# Patient Record
Sex: Male | Born: 1950 | Hispanic: Yes | Marital: Married | Smoking: Never smoker
Health system: Southern US, Community
[De-identification: ages and names within clinical notes are randomized; demographics above are authoritative.]

## PROBLEM LIST (undated history)

## (undated) DIAGNOSIS — E785 Hyperlipidemia, unspecified: Secondary | ICD-10-CM

## (undated) DIAGNOSIS — H3320 Serous retinal detachment, unspecified eye: Secondary | ICD-10-CM

## (undated) DIAGNOSIS — I10 Essential (primary) hypertension: Secondary | ICD-10-CM

## (undated) DIAGNOSIS — K759 Inflammatory liver disease, unspecified: Secondary | ICD-10-CM

## (undated) DIAGNOSIS — I2510 Atherosclerotic heart disease of native coronary artery without angina pectoris: Secondary | ICD-10-CM

## (undated) DIAGNOSIS — Z87442 Personal history of urinary calculi: Secondary | ICD-10-CM

## (undated) DIAGNOSIS — D649 Anemia, unspecified: Secondary | ICD-10-CM

## (undated) DIAGNOSIS — N200 Calculus of kidney: Secondary | ICD-10-CM

## (undated) DIAGNOSIS — I219 Acute myocardial infarction, unspecified: Secondary | ICD-10-CM

## (undated) HISTORY — PX: EYE SURGERY: SHX253

## (undated) HISTORY — DX: Anemia, unspecified: D64.9

---

## 2004-06-30 ENCOUNTER — Ambulatory Visit: Payer: Self-pay | Admitting: Internal Medicine

## 2004-07-21 ENCOUNTER — Ambulatory Visit: Payer: Self-pay | Admitting: Internal Medicine

## 2004-09-18 ENCOUNTER — Ambulatory Visit: Payer: Self-pay | Admitting: Internal Medicine

## 2010-06-19 ENCOUNTER — Ambulatory Visit: Payer: Self-pay | Admitting: Internal Medicine

## 2010-06-19 ENCOUNTER — Ambulatory Visit (INDEPENDENT_AMBULATORY_CARE_PROVIDER_SITE_OTHER): Payer: Self-pay | Admitting: Internal Medicine

## 2010-06-19 ENCOUNTER — Encounter: Payer: Self-pay | Admitting: Internal Medicine

## 2010-06-19 DIAGNOSIS — Z131 Encounter for screening for diabetes mellitus: Secondary | ICD-10-CM

## 2010-06-19 DIAGNOSIS — E785 Hyperlipidemia, unspecified: Secondary | ICD-10-CM | POA: Insufficient documentation

## 2010-06-19 DIAGNOSIS — L409 Psoriasis, unspecified: Secondary | ICD-10-CM | POA: Insufficient documentation

## 2010-06-19 DIAGNOSIS — I10 Essential (primary) hypertension: Secondary | ICD-10-CM | POA: Insufficient documentation

## 2010-06-19 DIAGNOSIS — R21 Rash and other nonspecific skin eruption: Secondary | ICD-10-CM | POA: Insufficient documentation

## 2010-06-24 NOTE — Assessment & Plan Note (Addendum)
Summary: rash/kn   Vital Signs:  Patient profile:   60 year old male Height:      69 inches Weight:      182.50 pounds BMI:     27.05 Pulse rate:   90 / minute Pulse rhythm:   regular BP sitting:   142 / 88  (left arm) Cuff size:   regular  Vitals Entered By: Army Fossa CMA (June 19, 2010 1:47 PM) CC: c/o rash on face- spreading to eye area x 1 week CBG Result 87 Comments [facility] [address]    History of Present Illness: New Patient symptoms started 2 weeks ago, no clear triggers rash is only slightly  itchy, started as blisters (?) OTC hydrocortisone did no change rash much he is not on any new meds, did change his soap before the rash but used all over not only in the face ; he recall been in contact w/ chemicals at work but doesn't recall the names  ROS no fever no ST no hands, wrist lesion, actually no other lesions  anywhere no other family members affected   Current Medications (verified): 1)  Diovan 80 Mg Tabs (Valsartan) .Marland Kitchen.. 1 By Mouth Once Daily  Allergies (verified): 1)  ! Pcn 2)  ! 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   Ears:  R ear normal and L ear normal.   Mouth:  no redness  Skin:  worse rash is around the mouth L>R, lower > upper area: redness, thick skin , slightly  scally, no blisters. Similar rash,  ~ 1cm above R eyebrown Similar rash but much smaller at the lower R>L eye lid and L cheeck ( ~2x2cm) a macular erythema -dry area on the sides of the neck nose, hands,  wrist----clear   Impression & Recommendations:  Problem # 1:  RASH-NONVESICULAR (ICD-782.1) given location i suspect contact dermatitis dout herpes , folliculitis. ?rosacea CBG 87 plan: treat as allergies, see Rx;  advised the patient to call me soon, if he's not improving or getting worse, he will need a dermatology referral  His updated medication list for this problem includes:    Hydrocortisone 2.5 % Crea (Hydrocortisone) .Marland Kitchen... Apply two times a day x 10 days  Complete Medication List: 1)  Diovan 80 Mg Tabs (Valsartan) .Marland Kitchen.. 1 by mouth once daily 2)  Aspirin 81 Mg Tbec (Aspirin) .... Prn 3)  Prednisone 10 Mg Tabs (Prednisone) .... 4 by mouth once daily x 3, 3x3,2x3,1x3 4)  Hydrocortisone 2.5 % Crea (Hydrocortisone) .... Apply two times a day x 10 days  Other Orders: Glucose, (CBG) (16109)  Patient Instructions: 1)  avoid cream in your eyes 2)  call if symptoms increase or no better in 1 week Prescriptions: HYDROCORTISONE 2.5 % CREA (HYDROCORTISONE) apply two times a day x 10 days  #1 x 0   Entered and Authorized by:   Nolon Rod. Devaunte Gasparini MD   Signed by:   Nolon Rod. Yahya Boldman MD on 06/19/2010   Method used:   Print then Give to Patient   RxID:   [id number] PREDNISONE 10  MG TABS (PREDNISONE) 4 by mouth once daily x 3, 3x3,2x3,1x3  #30 x 0   Entered and Authorized by:   Elita Quick E. Winfred Iiams MD   Signed by:   Nolon Rod. Daryl Quiros MD on 06/19/2010   Method used:   Print then Give to Patient   RxID:   [id number]    Orders Added: 1)  Glucose, (CBG) [82962] 2)  New Patient Level II [99202]    Prevention & Chronic Care Immunizations   Influenza vaccine: Not documented    Tetanus booster: Not documented    Pneumococcal vaccine: Not documented    H. zoster vaccine: Not documented  Colorectal Screening   Hemoccult: Not documented    Colonoscopy: Not documented  Other Screening   PSA: Not documented   Smoking status: Not documented  Lipids   Total Cholesterol: Not documented    LDL: Not documented   LDL Direct: Not documented   HDL: Not documented   Triglycerides: Not documented    SGOT (AST): Not documented   SGPT (ALT): Not documented   Alkaline phosphatase: Not documented   Total bilirubin: Not documented  Hypertension   Last Blood Pressure: 142 / 88  (06/19/2010)   Serum creatinine: Not documented   Serum potassium Not documented  Self-Management Support :    Hypertension self-management support: Not documented    Lipid self-management support: Not documented    Appended Document: rash/kn  spoke with the patient's wife, rash is  better.

## 2010-07-03 NOTE — Progress Notes (Signed)
Summary: [facility] Office Note  [facility] Office Note   Imported By: Maryln Gottron 06/24/2010 13:26:27  _____________________________________________________________________  External Attachment:    Type:   Image     Comment:   External Document

## 2014-04-06 ENCOUNTER — Emergency Department (HOSPITAL_BASED_OUTPATIENT_CLINIC_OR_DEPARTMENT_OTHER): Payer: No Typology Code available for payment source

## 2014-04-06 ENCOUNTER — Emergency Department (HOSPITAL_BASED_OUTPATIENT_CLINIC_OR_DEPARTMENT_OTHER)
Admission: EM | Admit: 2014-04-06 | Discharge: 2014-04-06 | Disposition: A | Discharge status: Home / Self Care | Payer: No Typology Code available for payment source | Attending: Emergency Medicine | Admitting: Emergency Medicine

## 2014-04-06 ENCOUNTER — Encounter (HOSPITAL_BASED_OUTPATIENT_CLINIC_OR_DEPARTMENT_OTHER): Payer: Self-pay | Admitting: Emergency Medicine

## 2014-04-06 DIAGNOSIS — Z880 Allergy status to penicillin: Secondary | ICD-10-CM | POA: Insufficient documentation

## 2014-04-06 DIAGNOSIS — Z79899 Other long term (current) drug therapy: Secondary | ICD-10-CM | POA: Diagnosis not present

## 2014-04-06 DIAGNOSIS — I10 Essential (primary) hypertension: Secondary | ICD-10-CM | POA: Insufficient documentation

## 2014-04-06 DIAGNOSIS — R1032 Left lower quadrant pain: Secondary | ICD-10-CM

## 2014-04-06 DIAGNOSIS — E785 Hyperlipidemia, unspecified: Secondary | ICD-10-CM | POA: Diagnosis not present

## 2014-04-06 DIAGNOSIS — N201 Calculus of ureter: Secondary | ICD-10-CM | POA: Insufficient documentation

## 2014-04-06 DIAGNOSIS — Z8669 Personal history of other diseases of the nervous system and sense organs: Secondary | ICD-10-CM | POA: Insufficient documentation

## 2014-04-06 DIAGNOSIS — Z87442 Personal history of urinary calculi: Secondary | ICD-10-CM | POA: Insufficient documentation

## 2014-04-06 HISTORY — DX: Hyperlipidemia, unspecified: E78.5

## 2014-04-06 HISTORY — DX: Serous retinal detachment, unspecified eye: H33.20

## 2014-04-06 HISTORY — DX: Essential (primary) hypertension: I10

## 2014-04-06 HISTORY — DX: Calculus of kidney: N20.0

## 2014-04-06 LAB — CBC WITH DIFFERENTIAL/PLATELET
BASOS PCT: 0 % (ref 0–1)
Basophils Absolute: 0 10*3/uL (ref 0.0–0.1)
EOS PCT: 1 % (ref 0–5)
Eosinophils Absolute: 0.1 10*3/uL (ref 0.0–0.7)
HCT: 43.3 % (ref 39.0–52.0)
Hemoglobin: 15.2 g/dL (ref 13.0–17.0)
Lymphocytes Relative: 35 % (ref 12–46)
Lymphs Abs: 3.4 10*3/uL (ref 0.7–4.0)
MCH: 31.1 pg (ref 26.0–34.0)
MCHC: 35.1 g/dL (ref 30.0–36.0)
MCV: 88.5 fL (ref 78.0–100.0)
Monocytes Absolute: 0.7 10*3/uL (ref 0.1–1.0)
Monocytes Relative: 7 % (ref 3–12)
NEUTROS PCT: 57 % (ref 43–77)
Neutro Abs: 5.5 10*3/uL (ref 1.7–7.7)
PLATELETS: 234 10*3/uL (ref 150–400)
RBC: 4.89 MIL/uL (ref 4.22–5.81)
RDW: 13.1 % (ref 11.5–15.5)
WBC: 9.7 10*3/uL (ref 4.0–10.5)

## 2014-04-06 LAB — COMPREHENSIVE METABOLIC PANEL
ALBUMIN: 4.2 g/dL (ref 3.5–5.2)
ALK PHOS: 73 U/L (ref 39–117)
ALT: 39 U/L (ref 0–53)
ANION GAP: 15 (ref 5–15)
AST: 32 U/L (ref 0–37)
BUN: 17 mg/dL (ref 6–23)
CO2: 23 mEq/L (ref 19–32)
Calcium: 9.5 mg/dL (ref 8.4–10.5)
Chloride: 102 mEq/L (ref 96–112)
Creatinine, Ser: 1.4 mg/dL — ABNORMAL HIGH (ref 0.50–1.35)
GFR calc Af Amer: 60 mL/min — ABNORMAL LOW (ref >90)
GFR calc non Af Amer: 52 mL/min — ABNORMAL LOW (ref >90)
Glucose, Bld: 160 mg/dL — ABNORMAL HIGH (ref 70–99)
POTASSIUM: 3.9 meq/L (ref 3.7–5.3)
Sodium: 140 mEq/L (ref 137–147)
Total Bilirubin: 0.3 mg/dL (ref 0.3–1.2)
Total Protein: 7.4 g/dL (ref 6.0–8.3)

## 2014-04-06 LAB — URINALYSIS, ROUTINE W REFLEX MICROSCOPIC
BILIRUBIN URINE: NEGATIVE
Glucose, UA: NEGATIVE mg/dL
Ketones, ur: 15 mg/dL — AB
Leukocytes, UA: NEGATIVE
NITRITE: NEGATIVE
PH: 6 (ref 5.0–8.0)
Protein, ur: 30 mg/dL — AB
SPECIFIC GRAVITY, URINE: 1.025 (ref 1.005–1.030)
UROBILINOGEN UA: 0.2 mg/dL (ref 0.0–1.0)

## 2014-04-06 LAB — URINE MICROSCOPIC-ADD ON

## 2014-04-06 MED ORDER — FENTANYL CITRATE 0.05 MG/ML IJ SOLN
50.0000 ug | Freq: Once | INTRAMUSCULAR | Status: AC
  Administered 2014-04-06: 50 ug via INTRAVENOUS

## 2014-04-06 MED ORDER — ONDANSETRON 8 MG PO TBDP: 8.0000 mg | ORAL_TABLET | Freq: Three times a day (TID) | ORAL | Status: DC | PRN

## 2014-04-06 MED ORDER — ONDANSETRON HCL 4 MG/2ML IJ SOLN
4.0000 mg | Freq: Once | INTRAMUSCULAR | Status: AC
  Administered 2014-04-06: 4 mg via INTRAVENOUS
  Filled 2014-04-06: qty 2

## 2014-04-06 MED ORDER — SODIUM CHLORIDE 0.9 % IV SOLN
1000.0000 mL | Freq: Once | INTRAVENOUS | Status: AC
  Administered 2014-04-06: 1000 mL via INTRAVENOUS

## 2014-04-06 MED ORDER — TAMSULOSIN HCL 0.4 MG PO CAPS
0.4000 mg | ORAL_CAPSULE | Freq: Once | ORAL | Status: AC
  Administered 2014-04-06: 0.4 mg via ORAL
  Filled 2014-04-06: qty 1

## 2014-04-06 MED ORDER — HYDROMORPHONE HCL 4 MG PO TABS: 2.0000 mg | ORAL_TABLET | ORAL | Status: DC | PRN

## 2014-04-06 MED ORDER — FENTANYL CITRATE 0.05 MG/ML IJ SOLN
INTRAMUSCULAR | Status: AC
  Filled 2014-04-06: qty 2

## 2014-04-06 MED ORDER — KETOROLAC TROMETHAMINE 15 MG/ML IJ SOLN
15.0000 mg | Freq: Once | INTRAMUSCULAR | Status: AC
  Administered 2014-04-06: 15 mg via INTRAVENOUS
  Filled 2014-04-06: qty 1

## 2014-04-06 MED ORDER — TAMSULOSIN HCL 0.4 MG PO CAPS: ORAL_CAPSULE | ORAL | Status: DC

## 2014-04-06 MED ORDER — SODIUM CHLORIDE 0.9 % IV SOLN
1000.0000 mL | INTRAVENOUS | Status: DC
  Administered 2014-04-06: 1000 mL via INTRAVENOUS

## 2014-04-06 NOTE — ED Provider Notes (Signed)
CSN: [id number]     Arrival date & time 04/06/14  0358 History   First MD Initiated Contact with Patient 04/06/14 0441     Chief Complaint  Patient presents with  . Abdominal Pain     (Consider location/radiation/quality/duration/timing/severity/associated sxs/prior Treatment) HPI  This is a 63 year old male who had the sudden onset of left lower quadrant pain about 1:30 this morning. The pain has gradually worsened and is now severe. The pain does not change significantly with movement or palpation. There is some radiation of the pain to the left flank. He has a history of kidney stones in the past but is not sure if this feels the same. He has had nausea but no vomiting. He has had some diarrhea. He denies fever. He has had chills. He is having difficulty urinating. He is not aware of having hematuria.  Past Medical History  Diagnosis Date  . Hypertension   . Hyperlipidemia   . Kidney stone   . Retinal detachment    Past Surgical History  Procedure Laterality Date  . Eye surgery     History reviewed. No pertinent family history. History  Substance Use Topics  . Smoking status: Never Smoker   . Smokeless tobacco: Not on file  . Alcohol Use: No    Review of Systems  All other systems reviewed and are negative.   Allergies  Lisinopril and Penicillins  Home Medications   Prior to Admission medications   Medication Sig Start Date End Date Taking? Authorizing Provider  atorvastatin (LIPITOR) 20 MG tablet Take 20 mg by mouth daily.   Yes Historical Provider, MD  losartan (COZAAR) 100 MG tablet Take 100 mg by mouth daily.   Yes Historical Provider, MD  metoprolol succinate (TOPROL-XL) 50 MG 24 hr tablet Take 50 mg by mouth daily. Take with or immediately following a meal.   Yes Historical Provider, MD   BP 162/82 mmHg  Pulse 70  Temp(Src) 98 F (36.7 C) (Oral)  Resp 20  Ht 5\' 7"  (1.702 m)  Wt 185 lb (83.915 kg)  BMI 28.97 kg/m2  SpO2 96%   Physical Exam  General:  Well-developed, well-nourished male in no acute distress; appearance consistent with age of record HENT: normocephalic; atraumatic Eyes: Right pupil round and reactive to light; left pupil irregular; extraocular muscles intact Neck: supple Heart: regular rate and rhythm Lungs: clear to auscultation bilaterally Abdomen: soft; nondistended; nontender; no masses or hepatosplenomegaly; bowel sounds present GU: No CVA tenderness Extremities: No deformity; full range of motion; pulses normal Neurologic: Awake, alert and oriented; motor function intact in all extremities and symmetric; no facial droop Skin: Warm and dry Psychiatric: Flat affect  ED Course  Procedures (including critical care time)   MDM  Nursing notes and vitals signs, including pulse oximetry, reviewed.  Summary of this visit's results, reviewed by myself:  Labs:  Results for orders placed or performed during the hospital encounter of 04/06/14 (from the past 24 hour(s))  Urinalysis with microscopic     Status: Abnormal   Collection Time: 04/06/14  4:10 AM  Result Value Ref Range   Color, Urine YELLOW YELLOW   APPearance CLOUDY (A) CLEAR   Specific Gravity, Urine 1.025 1.005 - 1.030   pH 6.0 5.0 - 8.0   Glucose, UA NEGATIVE NEGATIVE mg/dL   Hgb urine dipstick LARGE (A) NEGATIVE   Bilirubin Urine NEGATIVE NEGATIVE   Ketones, ur 15 (A) NEGATIVE mg/dL   Protein, ur 30 (A) NEGATIVE mg/dL   Urobilinogen, UA  0.2 0.0 - 1.0 mg/dL   Nitrite NEGATIVE NEGATIVE   Leukocytes, UA NEGATIVE NEGATIVE  Urine microscopic-add on     Status: Abnormal   Collection Time: 04/06/14  4:10 AM  Result Value Ref Range   Squamous Epithelial / LPF RARE RARE   WBC, UA 0-2 <3 WBC/hpf   RBC / HPF TOO NUMEROUS TO COUNT <3 RBC/hpf   Bacteria, UA FEW (A) RARE   Urine-Other MUCOUS PRESENT   CBC WITH DIFFERENTIAL     Status: None   Collection Time: 04/06/14  4:20 AM  Result Value Ref Range   WBC 9.7 4.0 - 10.5 K/uL   RBC 4.89 4.22 - 5.81 MIL/uL    Hemoglobin 15.2 13.0 - 17.0 g/dL   HCT 16.1 09.6 - 04.5 %   MCV 88.5 78.0 - 100.0 fL   MCH 31.1 26.0 - 34.0 pg   MCHC 35.1 30.0 - 36.0 g/dL   RDW 40.9 81.1 - 91.4 %   Platelets 234 150 - 400 K/uL   Neutrophils Relative % 57 43 - 77 %   Neutro Abs 5.5 1.7 - 7.7 K/uL   Lymphocytes Relative 35 12 - 46 %   Lymphs Abs 3.4 0.7 - 4.0 K/uL   Monocytes Relative 7 3 - 12 %   Monocytes Absolute 0.7 0.1 - 1.0 K/uL   Eosinophils Relative 1 0 - 5 %   Eosinophils Absolute 0.1 0.0 - 0.7 K/uL   Basophils Relative 0 0 - 1 %   Basophils Absolute 0.0 0.0 - 0.1 K/uL  Comprehensive metabolic panel     Status: Abnormal   Collection Time: 04/06/14  4:20 AM  Result Value Ref Range   Sodium 140 137 - 147 mEq/L   Potassium 3.9 3.7 - 5.3 mEq/L   Chloride 102 96 - 112 mEq/L   CO2 23 19 - 32 mEq/L   Glucose, Bld 160 (H) 70 - 99 mg/dL   BUN 17 6 - 23 mg/dL   Creatinine, Ser 7.82 (H) 0.50 - 1.35 mg/dL   Calcium 9.5 8.4 - 95.6 mg/dL   Total Protein 7.4 6.0 - 8.3 g/dL   Albumin 4.2 3.5 - 5.2 g/dL   AST 32 0 - 37 U/L   ALT 39 0 - 53 U/L   Alkaline Phosphatase 73 39 - 117 U/L   Total Bilirubin 0.3 0.3 - 1.2 mg/dL   GFR calc non Af Amer 52 (L) >90 mL/min   GFR calc Af Amer 60 (L) >90 mL/min   Anion gap 15 5 - 15    Imaging Studies: Ct Renal Stone Study  04/06/2014   CLINICAL DATA:  Left lower quadrant pain and nausea. History of renal stones.  EXAM: CT ABDOMEN AND PELVIS WITHOUT CONTRAST  TECHNIQUE: Multidetector CT imaging of the abdomen and pelvis was performed following the standard protocol without IV contrast.  COMPARISON:  None.  FINDINGS: Dependent atelectasis in the lung bases.  4 mm stone in the distal left ureter at the ureterovesical junction with left hydronephrosis and hydroureter and. Ureteral stranding. Additional nonobstructing stones in both kidneys. No hydronephrosis or hydroureter on the right.  The bladder is incompletely distended. Mild bladder wall thickening may be due at least in part  to under distention.  Unenhanced appearance of the liver, spleen, gallbladder, pancreas, adrenal glands, abdominal aorta common inferior vena cava, and retroperitoneal lymph nodes is unremarkable. Stomach, small bowel, and colon are decompressed. No free air or free fluid in the abdomen.  Pelvis: Appendix is normal.  Prostate gland is enlarged, measuring 5.8 cm diameter. No free or loculated pelvic fluid collections. No pelvic mass or lymphadenopathy. No destructive bone lesions.  IMPRESSION: 4 mm stone in the distal left ureter with moderate proximal obstruction. Bilateral nonobstructing intrarenal stones. Prostate gland enlargement.   Electronically Signed   By: Burman NievesWilliam  Stevens M.D.   On: 04/06/2014 05:21   5:51 AM Patient comfortable after IV fentanyl, Zofran and Toradol.   Hanley SeamenJohn L Vernita Tague, MD 04/06/14 [id number]

## 2014-04-06 NOTE — ED Notes (Signed)
Pt reports sudden acute onset of llq pain, some nausea, no diarrhea, reports h/o kidney stones , but states that this pain feels different

## 2015-04-13 IMAGING — CT CT RENAL STONE PROTOCOL
2 of 4 series · 16 of 46 positions shown, 18 images · non-contrast
Comparison: None.

CLINICAL DATA: Left lower quadrant pain and nausea. History of
renal stones.

EXAM:
CT ABDOMEN AND PELVIS WITHOUT CONTRAST
TECHNIQUE: Multidetector CT imaging of the abdomen and pelvis was performed
following the standard protocol without IV contrast.

[Series 2: renal stone > 200 lbs 5.0 b31f · axial · 0.73mm/px · z∈[-467,+3]mm · 13 of 104 slices shown, 15 images]
[im 5/104  soft-tissue]
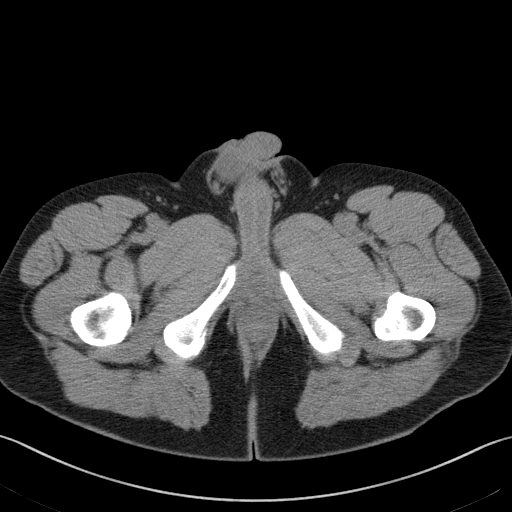
[im 5/104  bone]
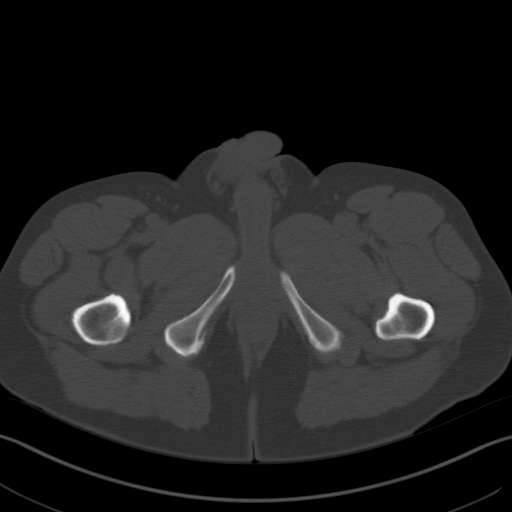
[im 15/104  soft-tissue]
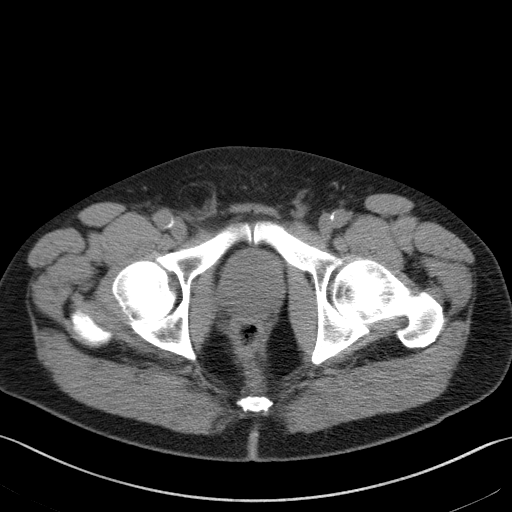
[im 24/104  soft-tissue]
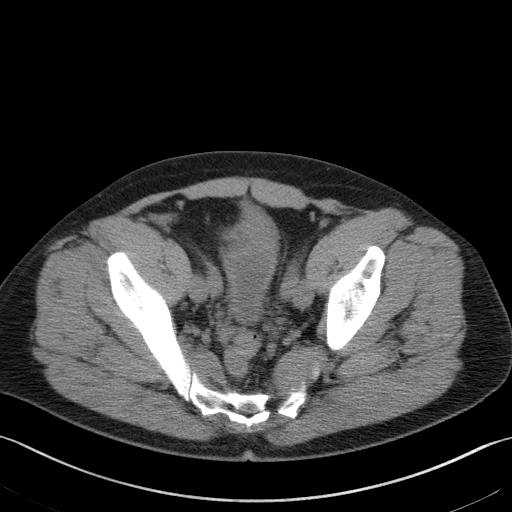
[im 29/104  soft-tissue]
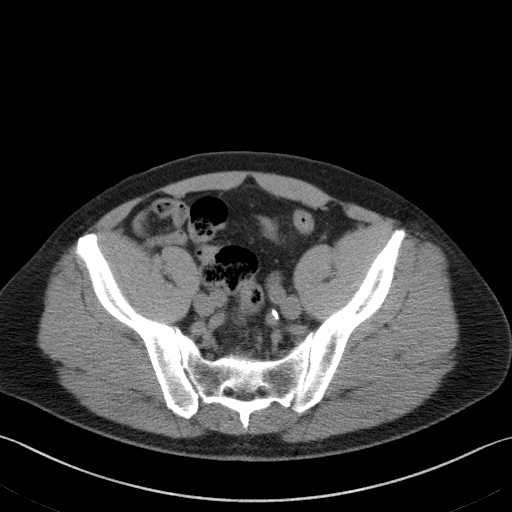
[im 38/104  soft-tissue]
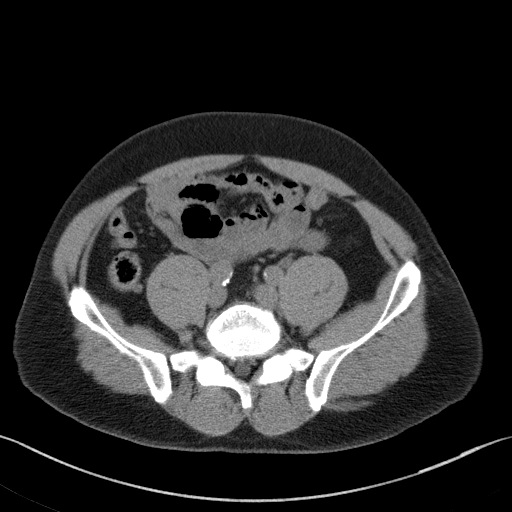
[im 43/104  soft-tissue]
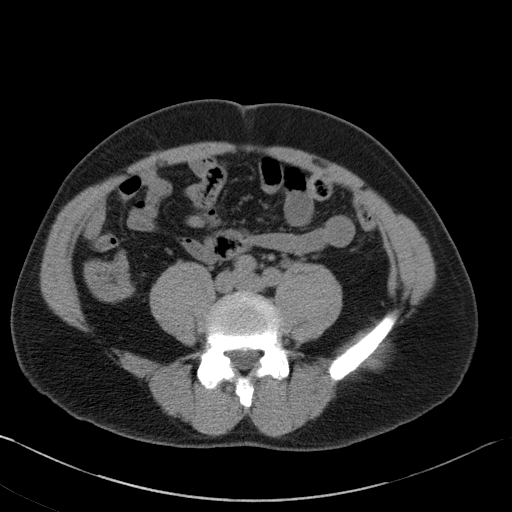
[im 52/104  soft-tissue]
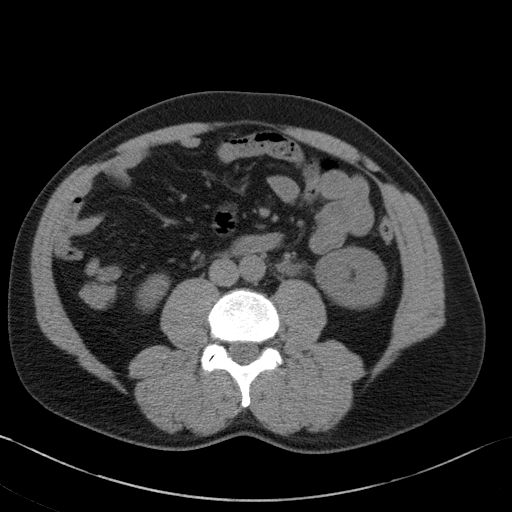
[im 61/104  soft-tissue]
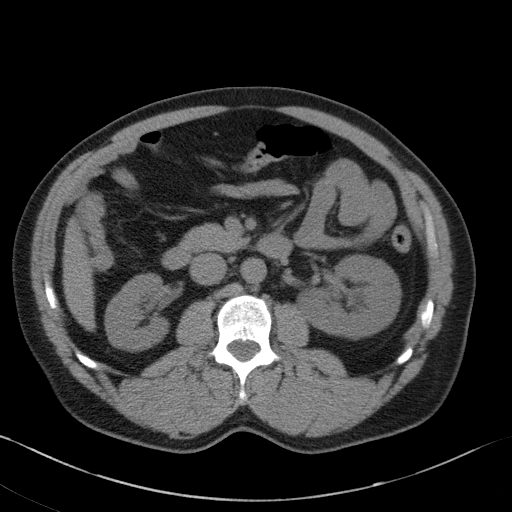
[im 66/104  soft-tissue]
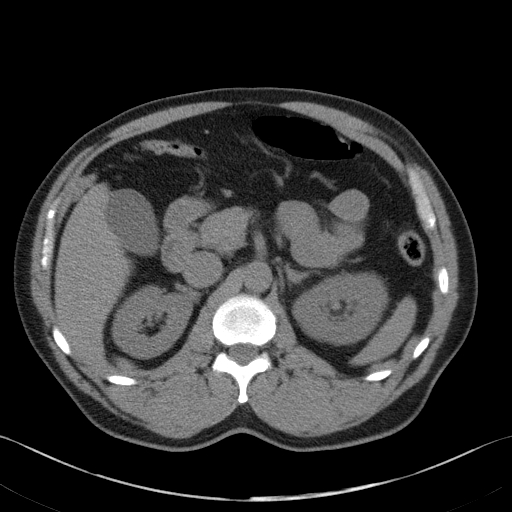
[im 66/104  bone]
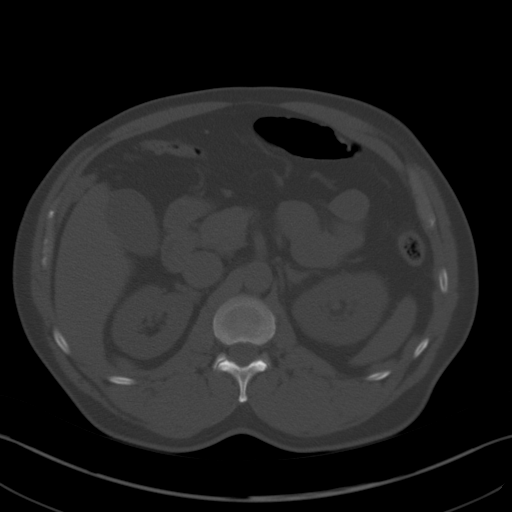
[im 75/104  soft-tissue]
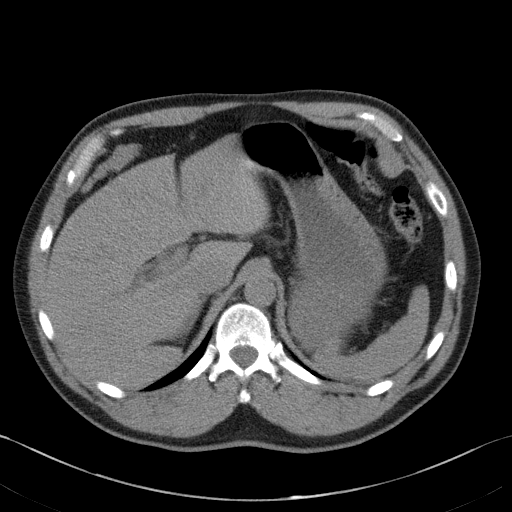
[im 80/104  soft-tissue]
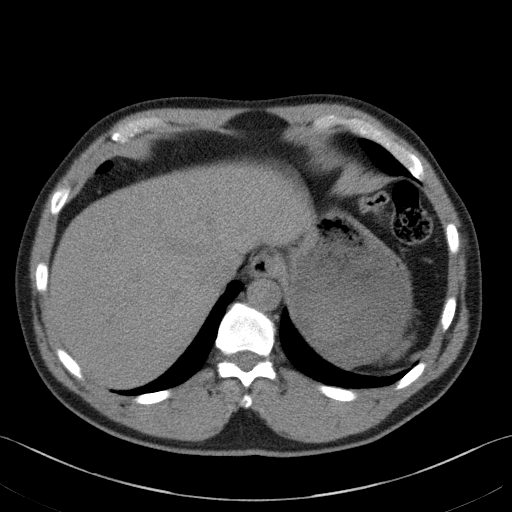
[im 89/104  soft-tissue]
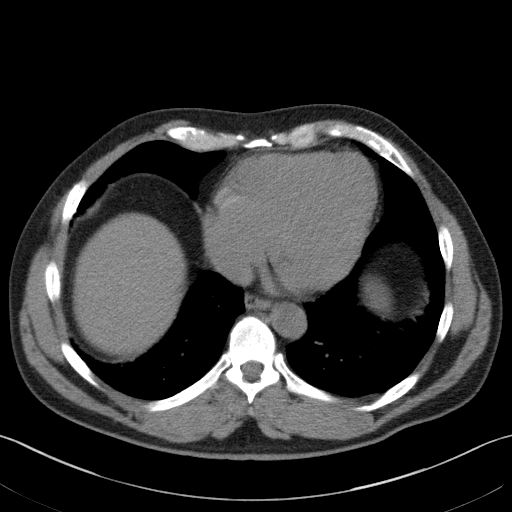
[im 99/104  soft-tissue]
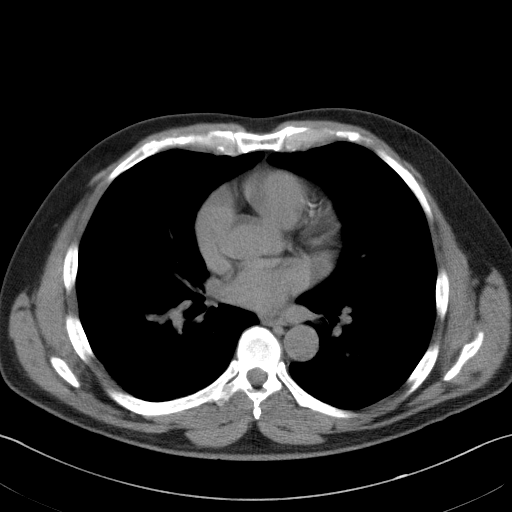

[Series 5: renal stone 3.0 coronal · coronal · 0.78mm/px · 3 of 90 slices shown]
[im 30/90  soft-tissue]
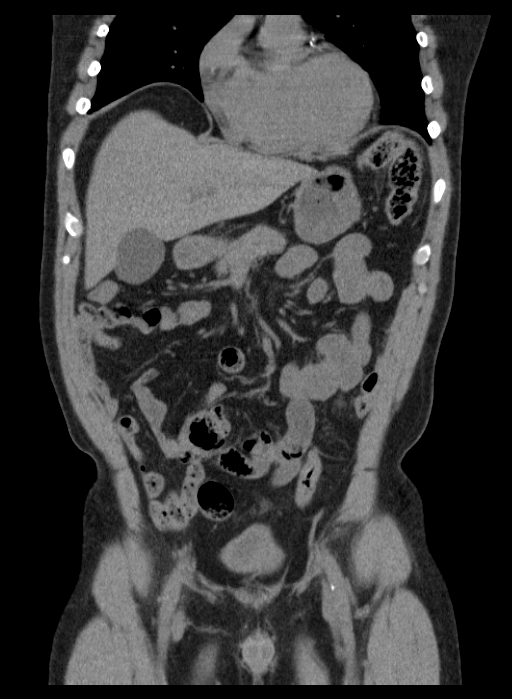
[im 40/90  soft-tissue]
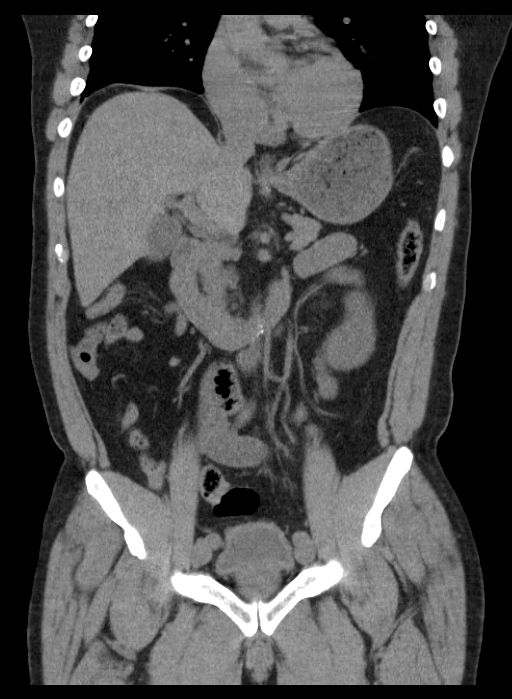
[im 50/90  soft-tissue]
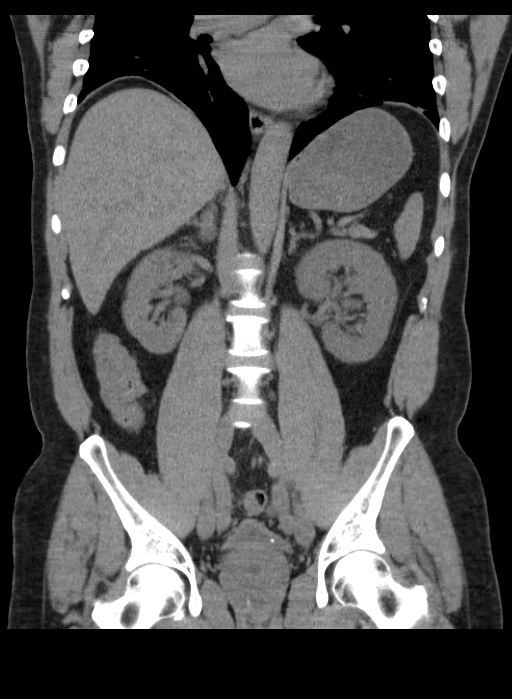

[16 of 46 positions shown; findings below may reference images not displayed]

FINDINGS: Dependent atelectasis in the lung bases.

4 mm stone in the distal left ureter at the ureterovesical junction
with left hydronephrosis and hydroureter and. Ureteral stranding.
Additional nonobstructing stones in both kidneys. No hydronephrosis
or hydroureter on the right.

The bladder is incompletely distended. Mild bladder wall thickening
may be due at least in part to under distention.

Unenhanced appearance of the liver, spleen, gallbladder, pancreas,
adrenal glands, abdominal aorta common inferior vena cava, and
retroperitoneal lymph nodes is unremarkable. Stomach, small bowel,
and colon are decompressed. No free air or free fluid in the
abdomen.

Pelvis: Appendix is normal. Prostate gland is enlarged, measuring
5.8 cm diameter. No free or loculated pelvic fluid collections. No
pelvic mass or lymphadenopathy. No destructive bone lesions.
IMPRESSION: 4 mm stone in the distal left ureter with moderate proximal
obstruction. Bilateral nonobstructing intrarenal stones. Prostate
gland enlargement.

## 2018-06-07 ENCOUNTER — Ambulatory Visit: Payer: No Typology Code available for payment source | Admitting: Medical

## 2018-06-07 ENCOUNTER — Encounter: Payer: Self-pay | Admitting: Medical

## 2018-06-07 ENCOUNTER — Ambulatory Visit (INDEPENDENT_AMBULATORY_CARE_PROVIDER_SITE_OTHER): Payer: Medicare Other | Admitting: Medical

## 2018-06-07 VITALS — BP 140/90 | HR 58 | Temp 98.4°F | Resp 16 | Ht 68.0 in | Wt 190.6 lb

## 2018-06-07 DIAGNOSIS — E785 Hyperlipidemia, unspecified: Secondary | ICD-10-CM

## 2018-06-07 DIAGNOSIS — I10 Essential (primary) hypertension: Secondary | ICD-10-CM

## 2018-06-07 DIAGNOSIS — R739 Hyperglycemia, unspecified: Secondary | ICD-10-CM

## 2018-06-07 DIAGNOSIS — Z125 Encounter for screening for malignant neoplasm of prostate: Secondary | ICD-10-CM | POA: Diagnosis not present

## 2018-06-07 MED ORDER — LOSARTAN POTASSIUM 100 MG PO TABS: 100.0000 mg | ORAL_TABLET | Freq: Every day | ORAL | 3 refills | Status: DC

## 2018-06-07 MED ORDER — ATORVASTATIN CALCIUM 20 MG PO TABS: 20.0000 mg | ORAL_TABLET | Freq: Every day | ORAL | 3 refills | Status: DC

## 2018-06-07 MED ORDER — METOPROLOL SUCCINATE ER 50 MG PO TB24: 50.0000 mg | ORAL_TABLET | Freq: Every day | ORAL | 3 refills | Status: DC

## 2018-06-07 NOTE — Progress Notes (Signed)
Subjective:    Patient ID: Bryan Craig, male    DOB: Sep 02, 1950, 68 y.o.   MRN: [id number]  HPI  Pt in for follow up.   Here for first time. Former pt of local pcp.  Pt used to work [employer] represented the [employer]. Also in gi equipment. Pt does work out regularly. He rides his mountain bike when weather ok. He states eats healthy. Nonsmokers. Occasional social drinker.  Pt has history of high cholesterol years ago. He is on lipor daily.  Pt also on bp medication. He is on cozaar and toprol.   Pt has history of kidney stones in the past.   Pt had elevated sugar 4 yeas ago on labs.  Pt states he had reaction to pneumonia vaccine.  Pt states he never had a colonoscopy. He is afraid of doing this procedure. He declines. No fh of colon cancers or polyps. No gi complaints.   Review of Systems  Constitutional: Negative for chills, fatigue and fever.  Respiratory: Negative for cough, chest tightness, shortness of breath and wheezing.   Cardiovascular: Negative for chest pain and palpitations.  Musculoskeletal: Negative for back pain and joint swelling.  Skin: Negative for rash.  Neurological: Negative for dizziness, speech difficulty, weakness and light-headedness.  Hematological: Negative for adenopathy. Does not bruise/bleed easily.  Psychiatric/Behavioral: Negative for behavioral problems and confusion.    Past Medical History:  Diagnosis Date  . Hyperlipidemia   . Hypertension   . Kidney stone   . Retinal detachment      Social History   Socioeconomic History  . Marital status: Married    Spouse name: Not on file  . Number of children: Not on file  . Years of education: Not on file  . Highest education level: Not on file  Occupational History  . Not on file  Social Needs  . Financial resource strain: Not on file  . Food insecurity:    Worry: Not on file    Inability: Not on file  . Transportation needs:    Medical: Not on file    Non-medical: Not on  file  Tobacco Use  . Smoking status: Never Smoker  . Smokeless tobacco: Never Used  Substance and Sexual Activity  . Alcohol use: No  . Drug use: Never  . Sexual activity: Not on file  Lifestyle  . Physical activity:    Days per week: Not on file    Minutes per session: Not on file  . Stress: Not on file  Relationships  . Social connections:    Talks on phone: Not on file    Gets together: Not on file    Attends religious service: Not on file    Active member of club or organization: Not on file    Attends meetings of clubs or organizations: Not on file    Relationship status: Not on file  . Intimate partner violence:    Fear of current or ex partner: Not on file    Emotionally abused: Not on file    Physically abused: Not on file    Forced sexual activity: Not on file  Other Topics Concern  . Not on file  Social History Narrative  . Not on file    Past Surgical History:  Procedure Laterality Date  . EYE SURGERY      No family history on file.  Allergies  Allergen Reactions  . Lisinopril     REACTION: cough  . Penicillins  Current Outpatient Medications on File Prior to Visit  Medication Sig Dispense Refill  . atorvastatin (LIPITOR) 20 MG tablet Take 20 mg by mouth daily.    . Difluprednate 0.05 % EMUL Administer 1 drop into the left eye daily.    Marland Kitchen HYDROmorphone (DILAUDID) 4 MG tablet Take 0.5-1 tablets (2-4 mg total) by mouth every 4 (four) hours as needed for severe pain. 30 tablet 0  . losartan (COZAAR) 100 MG tablet Take 100 mg by mouth daily.    . metoprolol succinate (TOPROL-XL) 50 MG 24 hr tablet Take 50 mg by mouth daily. Take with or immediately following a meal.    . ondansetron (ZOFRAN ODT) 8 MG disintegrating tablet Take 1 tablet (8 mg total) by mouth every 8 (eight) hours as needed for nausea or vomiting. 20 tablet 0  . tamsulosin (FLOMAX) 0.4 MG CAPS capsule Take 1 capsule daily until stone passes. 30 capsule 0   No current  facility-administered medications on file prior to visit.     BP (!) 150/96   Pulse (!) 58   Temp 98.4 F (36.9 C) (Oral)   Resp 16   Ht 5\' 8"  (1.727 m)   Wt 190 lb 9.6 oz (86.5 kg)   SpO2 99%   BMI 28.98 kg/m       Objective:   Physical Exam   General Mental Status- Alert. General Appearance- Not in acute distress.   Skin General: Color- Normal Color. Moisture- Normal Moisture.  Neck Carotid Arteries- Normal color. Moisture- Normal Moisture. No carotid bruits. No JVD.  Chest and Lung Exam Auscultation: Breath Sounds:-Normal.  Cardiovascular Auscultation:Rythm- Regular. Murmurs & Other Heart Sounds:Auscultation of the heart reveals- No Murmurs.  Abdomen Inspection:-Inspeection Normal. Palpation/Percussion:Note:No mass. Palpation and Percussion of the abdomen reveal- Non Tender, Non Distended + BS, no rebound or guarding.   Neurologic Cranial Nerve exam:- CN III-XII intact(No nystagmus), symmetric smile. Strength:- 5/5 equal and symmetric strength both upper and lower extremities.     Assessment & Plan:  Nice to meet you today.  Your blood pressure is high today and you did not take medication for BP today.  Ideal goal for blood pressure would be 130/80.  Please start taking medications daily.  I did refill both blood pressure medications today.  For high cholesterol history, I did refill your cholesterol medication.  For history of elevated blood sugar 4 years ago, I did place order for A1c/three-month blood sugar average test.  Also based on your medical history want to get metabolic panel and lipid panel.  Please schedule your blood test early morning next week.  I want you to be fasting for 8 hours prior to labs being done.  Also want you to follow-up in 2 weeks with me.  I want to see what your blood pressure level is on medication.  Esperanza Richters, PA-C

## 2018-06-07 NOTE — Patient Instructions (Addendum)
Nice to meet you today.  Your blood pressure is high today and you did not take medication for BP today.  Ideal goal for blood pressure would be 130/80.  Please start taking medications daily.  I did refill both blood pressure medications today.  For high cholesterol history, I did refill your cholesterol medication.  For history of elevated blood sugar 4 years ago, I did place order for A1c/three-month blood sugar average test.  Also based on your medical history want to get metabolic panel and lipid panel.  Please schedule your blood test early morning next week.  I want you to be fasting for 8 hours prior to labs being done.  Also want you to follow-up in 2 weeks with me.  I want to see what your blood pressure level is on medication.

## 2018-06-14 ENCOUNTER — Other Ambulatory Visit (INDEPENDENT_AMBULATORY_CARE_PROVIDER_SITE_OTHER): Payer: Medicare Other

## 2018-06-14 ENCOUNTER — Telehealth: Payer: Self-pay | Admitting: Medical

## 2018-06-14 DIAGNOSIS — I10 Essential (primary) hypertension: Secondary | ICD-10-CM | POA: Diagnosis not present

## 2018-06-14 DIAGNOSIS — R972 Elevated prostate specific antigen [PSA]: Secondary | ICD-10-CM

## 2018-06-14 DIAGNOSIS — E785 Hyperlipidemia, unspecified: Secondary | ICD-10-CM

## 2018-06-14 DIAGNOSIS — Z125 Encounter for screening for malignant neoplasm of prostate: Secondary | ICD-10-CM | POA: Diagnosis not present

## 2018-06-14 DIAGNOSIS — R739 Hyperglycemia, unspecified: Secondary | ICD-10-CM | POA: Diagnosis not present

## 2018-06-14 LAB — LIPID PANEL
CHOL/HDL RATIO: 4
Cholesterol: 155 mg/dL (ref 0–200)
HDL: 36.4 mg/dL — ABNORMAL LOW (ref >39.00)
LDL Cholesterol: 91 mg/dL (ref 0–99)
NonHDL: 118.16
TRIGLYCERIDES: 134 mg/dL (ref 0.0–149.0)
VLDL: 26.8 mg/dL (ref 0.0–40.0)

## 2018-06-14 LAB — COMPREHENSIVE METABOLIC PANEL
ALT: 23 U/L (ref 0–53)
AST: 20 U/L (ref 0–37)
Albumin: 4.4 g/dL (ref 3.5–5.2)
Alkaline Phosphatase: 71 U/L (ref 39–117)
BUN: 11 mg/dL (ref 6–23)
CO2: 29 mEq/L (ref 19–32)
CREATININE: 0.98 mg/dL (ref 0.40–1.50)
Calcium: 9.3 mg/dL (ref 8.4–10.5)
Chloride: 104 mEq/L (ref 96–112)
GFR: 75.88 mL/min (ref >60.00)
GLUCOSE: 90 mg/dL (ref 70–99)
Potassium: 4.5 mEq/L (ref 3.5–5.1)
Sodium: 140 mEq/L (ref 135–145)
Total Bilirubin: 0.8 mg/dL (ref 0.2–1.2)
Total Protein: 6.7 g/dL (ref 6.0–8.3)

## 2018-06-14 LAB — CBC WITH DIFFERENTIAL/PLATELET
BASOS ABS: 0 10*3/uL (ref 0.0–0.1)
Basophils Relative: 0.4 % (ref 0.0–3.0)
Eosinophils Absolute: 0.1 10*3/uL (ref 0.0–0.7)
Eosinophils Relative: 2.5 % (ref 0.0–5.0)
HCT: 45.8 % (ref 39.0–52.0)
HEMOGLOBIN: 15.5 g/dL (ref 13.0–17.0)
Lymphocytes Relative: 48.7 % — ABNORMAL HIGH (ref 12.0–46.0)
Lymphs Abs: 2.7 10*3/uL (ref 0.7–4.0)
MCHC: 33.9 g/dL (ref 30.0–36.0)
MCV: 91.6 fl (ref 78.0–100.0)
MONO ABS: 0.5 10*3/uL (ref 0.1–1.0)
MONOS PCT: 8.9 % (ref 3.0–12.0)
Neutro Abs: 2.2 10*3/uL (ref 1.4–7.7)
Neutrophils Relative %: 39.5 % — ABNORMAL LOW (ref 43.0–77.0)
Platelets: 257 10*3/uL (ref 150.0–400.0)
RBC: 5 Mil/uL (ref 4.22–5.81)
RDW: 13.6 % (ref 11.5–15.5)
WBC: 5.6 10*3/uL (ref 4.0–10.5)

## 2018-06-14 LAB — HEMOGLOBIN A1C: HEMOGLOBIN A1C: 5.5 % (ref 4.6–6.5)

## 2018-06-14 LAB — PSA: PSA: 4.27 ng/mL — ABNORMAL HIGH (ref 0.10–4.00)

## 2018-06-14 NOTE — Telephone Encounter (Signed)
Referral to urologist placed. 

## 2018-06-21 ENCOUNTER — Ambulatory Visit (INDEPENDENT_AMBULATORY_CARE_PROVIDER_SITE_OTHER): Payer: Medicare Other | Admitting: Medical

## 2018-06-21 ENCOUNTER — Encounter: Payer: Self-pay | Admitting: Medical

## 2018-06-21 VITALS — BP 130/80 | HR 66 | Temp 98.0°F | Resp 16 | Ht 68.0 in | Wt 189.4 lb

## 2018-06-21 DIAGNOSIS — I10 Essential (primary) hypertension: Secondary | ICD-10-CM | POA: Diagnosis not present

## 2018-06-21 DIAGNOSIS — R739 Hyperglycemia, unspecified: Secondary | ICD-10-CM

## 2018-06-21 DIAGNOSIS — E785 Hyperlipidemia, unspecified: Secondary | ICD-10-CM | POA: Diagnosis not present

## 2018-06-21 DIAGNOSIS — R972 Elevated prostate specific antigen [PSA]: Secondary | ICD-10-CM

## 2018-06-21 NOTE — Progress Notes (Signed)
Subjective:    Patient ID: Bryan Craig, male    DOB: 1950/10/06, 68 y.o.   MRN: [id number]  HPI  Pt in for bp check. At home when he checks at home bp at home is 135-140 systolic. Bottom number about 85-90. No cardiac or neurologic signs or symptoms.  Pt had labs since lat visit. Pt is not diabetic. Good cholesterol.   Pt had prostate protein elevated on the past visit. Pt not aware of any elevated psa level in past.. Pt has no fever, no chills, no sweats and no urinary difficulty.   Review of Systems  Constitutional: Negative for chills, fatigue and fever.  Respiratory: Negative for cough, chest tightness, shortness of breath and wheezing.   Cardiovascular: Negative for chest pain and palpitations.  Gastrointestinal: Negative for abdominal pain.  Genitourinary: Negative for decreased urine volume, dysuria, flank pain, frequency, genital sores, hematuria and testicular pain.  Musculoskeletal: Negative for back pain.  Skin: Negative for rash.  Neurological: Negative for dizziness and headaches.  Hematological: Negative for adenopathy. Does not bruise/bleed easily.  Psychiatric/Behavioral: Negative for behavioral problems, confusion and sleep disturbance. The patient is not nervous/anxious.     Past Medical History:  Diagnosis Date  . Hyperlipidemia   . Hypertension   . Kidney stone   . Retinal detachment      Social History   Socioeconomic History  . Marital status: Married    Spouse name: Not on file  . Number of children: Not on file  . Years of education: Not on file  . Highest education level: Not on file  Occupational History  . Not on file  Social Needs  . Financial resource strain: Not on file  . Food insecurity:    Worry: Not on file    Inability: Not on file  . Transportation needs:    Medical: Not on file    Non-medical: Not on file  Tobacco Use  . Smoking status: Never Smoker  . Smokeless tobacco: Never Used  Substance and Sexual Activity  .  Alcohol use: Yes    Comment: rare occasional social drinker.  . Drug use: Never  . Sexual activity: Yes  Lifestyle  . Physical activity:    Days per week: Not on file    Minutes per session: Not on file  . Stress: Not on file  Relationships  . Social connections:    Talks on phone: Not on file    Gets together: Not on file    Attends religious service: Not on file    Active member of club or organization: Not on file    Attends meetings of clubs or organizations: Not on file    Relationship status: Not on file  . Intimate partner violence:    Fear of current or ex partner: Not on file    Emotionally abused: Not on file    Physically abused: Not on file    Forced sexual activity: Not on file  Other Topics Concern  . Not on file  Social History Narrative  . Not on file    Past Surgical History:  Procedure Laterality Date  . EYE SURGERY      No family history on file.  Allergies  Allergen Reactions  . Lisinopril     REACTION: cough  . Penicillins     Current Outpatient Medications on File Prior to Visit  Medication Sig Dispense Refill  . atorvastatin (LIPITOR) 20 MG tablet Take 1 tablet (20 mg total) by mouth daily.  90 tablet 3  . Difluprednate 0.05 % EMUL Administer 1 drop into the left eye daily.    Marland Kitchen HYDROmorphone (DILAUDID) 4 MG tablet Take 0.5-1 tablets (2-4 mg total) by mouth every 4 (four) hours as needed for severe pain. 30 tablet 0  . losartan (COZAAR) 100 MG tablet Take 1 tablet (100 mg total) by mouth daily. 90 tablet 3  . metoprolol succinate (TOPROL-XL) 50 MG 24 hr tablet Take 1 tablet (50 mg total) by mouth daily. Take with or immediately following a meal. 90 tablet 3  . ondansetron (ZOFRAN ODT) 8 MG disintegrating tablet Take 1 tablet (8 mg total) by mouth every 8 (eight) hours as needed for nausea or vomiting. 20 tablet 0  . tamsulosin (FLOMAX) 0.4 MG CAPS capsule Take 1 capsule daily until stone passes. 30 capsule 0   No current facility-administered  medications on file prior to visit.     BP (!) 139/93   Pulse 66   Temp 98 F (36.7 C) (Oral)   Resp 16   Ht 5\' 8"  (1.727 m)   Wt 189 lb 6.4 oz (85.9 kg)   SpO2 98%   BMI 28.80 kg/m       Objective:   Physical Exam  General Mental Status- Alert. General Appearance- Not in acute distress.   Skin General: Color- Normal Color. Moisture- Normal Moisture.  Neck Carotid Arteries- Normal color. Moisture- Normal Moisture. No carotid bruits. No JVD.  Chest and Lung Exam Auscultation: Breath Sounds:-Normal.  Cardiovascular Auscultation:Rythm- Regular. Murmurs & Other Heart Sounds:Auscultation of the heart reveals- No Murmurs.   Neurologic Cranial Nerve exam:- CN III-XII intact(No nystagmus), symmetric smile. Strength:- 5/5 equal and symmetric strength both upper and lower extremities.      Assessment & Plan:  Your blood pressure is improved today compared to your last visit.  Blood pressure I got today 130/80 and is near ideal range.  Continue current blood pressure medication.  Your cholesterol numbers look very good except mild low HDL which is considered good cholesterol.  Exercise can increase good cholesterol.  Continue current dose of atorvastatin.  Your three-month blood sugar test came back in the non-diabetic range.  Continue low sugar diet and exercise will help control sugars in the future as well.  Your PSA was slightly elevated on recent labs.  No prior history of elevated PSA.  No obvious prostatitis type symptoms.  I did go ahead and refer you to urologist.  If you do not get it call by next Monday regarding referral appointment date then please call our office for an update or send me a my chart message directly.  Follow-up in 3 months or as needed.  Esperanza Richters, PA-C

## 2018-06-21 NOTE — Patient Instructions (Addendum)
Your blood pressure is improved today compared to your last visit.  Blood pressure I got today 130/80 and is near ideal range.  Continue current blood pressure medication.  Your cholesterol numbers look very good except mild low HDL which is considered good cholesterol.  Exercise can increase good cholesterol.  Continue current dose of atorvastatin.  Your three-month blood sugar test came back in the non-diabetic range.  Continue low sugar diet and exercise will help control sugars in the future as well.  Your PSA was slightly elevated on recent labs.  No prior history of elevated PSA.  No obvious prostatitis type symptoms.  I did go ahead and refer you to urologist.  If you do not get it call by next Monday regarding referral appointment date then please call our office for an update or send me a my chart message directly.  Follow-up in 3 months or as needed.

## 2018-06-22 ENCOUNTER — Telehealth: Payer: Self-pay | Admitting: Medical

## 2018-06-22 ENCOUNTER — Encounter: Payer: Self-pay | Admitting: Medical

## 2018-06-22 MED ORDER — FAMCICLOVIR 500 MG PO TABS: 500.0000 mg | ORAL_TABLET | Freq: Three times a day (TID) | ORAL | 0 refills | Status: DC

## 2018-06-22 NOTE — Telephone Encounter (Signed)
famvir sent to famvir

## 2018-09-07 ENCOUNTER — Other Ambulatory Visit: Payer: Self-pay

## 2018-09-07 ENCOUNTER — Telehealth: Payer: Self-pay | Admitting: Medical

## 2018-09-07 MED ORDER — LOSARTAN POTASSIUM 100 MG PO TABS: 100.0000 mg | ORAL_TABLET | Freq: Every day | ORAL | 0 refills | Status: DC

## 2018-09-07 NOTE — Progress Notes (Signed)
Left pt message to cal back to schedule vov

## 2018-09-07 NOTE — Telephone Encounter (Signed)
I asked pt to follow up on 09/19/2018. 3 month from last visit. He can schedule little early. Just refilled his bp medication for one month.

## 2018-09-07 NOTE — Telephone Encounter (Signed)
I documented in pt's chart that I left a message for pt to call and schedule vov.

## 2018-10-01 ENCOUNTER — Other Ambulatory Visit: Payer: Self-pay | Admitting: Medical

## 2018-10-15 ENCOUNTER — Other Ambulatory Visit: Payer: Self-pay | Admitting: Medical

## 2018-10-19 ENCOUNTER — Other Ambulatory Visit: Payer: Self-pay | Admitting: Medical

## 2019-01-13 ENCOUNTER — Other Ambulatory Visit: Payer: Self-pay | Admitting: Medical

## 2019-01-16 NOTE — Telephone Encounter (Signed)
Pt is due for follow up please call and schedule appointment.  

## 2019-02-12 ENCOUNTER — Other Ambulatory Visit: Payer: Self-pay | Admitting: Medical

## 2019-02-26 ENCOUNTER — Other Ambulatory Visit: Payer: Self-pay | Admitting: Medical

## 2019-02-27 NOTE — Telephone Encounter (Signed)
LM to schedule appointment

## 2019-02-27 NOTE — Telephone Encounter (Signed)
PT is due for follow up

## 2019-03-13 ENCOUNTER — Other Ambulatory Visit: Payer: Self-pay | Admitting: Medical

## 2019-03-13 NOTE — Telephone Encounter (Signed)
Pt is due for follow up

## 2019-03-13 NOTE — Telephone Encounter (Signed)
LM to schedule follow up visit

## 2019-05-28 ENCOUNTER — Other Ambulatory Visit: Payer: Self-pay | Admitting: Medical

## 2019-06-13 NOTE — Telephone Encounter (Signed)
Mychart message came back as unread. Attempted to reach pt to schedule OV and left detailed message to scheduled OV for further refills.

## 2019-08-03 ENCOUNTER — Other Ambulatory Visit: Payer: Self-pay

## 2019-08-03 ENCOUNTER — Ambulatory Visit: Payer: Medicare Other

## 2019-08-04 ENCOUNTER — Other Ambulatory Visit: Payer: Self-pay

## 2019-08-04 ENCOUNTER — Ambulatory Visit: Payer: Medicare Other | Attending: Internal Medicine

## 2019-08-04 ENCOUNTER — Ambulatory Visit (INDEPENDENT_AMBULATORY_CARE_PROVIDER_SITE_OTHER): Payer: Medicare Other | Admitting: Medical

## 2019-08-04 ENCOUNTER — Encounter: Payer: Self-pay | Admitting: Medical

## 2019-08-04 VITALS — BP 130/80 | HR 62 | Temp 98.3°F | Ht 68.0 in | Wt 180.4 lb

## 2019-08-04 DIAGNOSIS — I10 Essential (primary) hypertension: Secondary | ICD-10-CM

## 2019-08-04 DIAGNOSIS — Z23 Encounter for immunization: Secondary | ICD-10-CM

## 2019-08-04 DIAGNOSIS — E785 Hyperlipidemia, unspecified: Secondary | ICD-10-CM

## 2019-08-04 DIAGNOSIS — Z125 Encounter for screening for malignant neoplasm of prostate: Secondary | ICD-10-CM

## 2019-08-04 DIAGNOSIS — R739 Hyperglycemia, unspecified: Secondary | ICD-10-CM | POA: Diagnosis not present

## 2019-08-04 LAB — COMPREHENSIVE METABOLIC PANEL
ALT: 19 U/L (ref 0–53)
AST: 19 U/L (ref 0–37)
Albumin: 4.6 g/dL (ref 3.5–5.2)
Alkaline Phosphatase: 70 U/L (ref 39–117)
BUN: 12 mg/dL (ref 6–23)
CO2: 29 mEq/L (ref 19–32)
Calcium: 9.5 mg/dL (ref 8.4–10.5)
Chloride: 101 mEq/L (ref 96–112)
Creatinine, Ser: 0.96 mg/dL (ref 0.40–1.50)
GFR: 77.45 mL/min (ref >60.00)
Glucose, Bld: 96 mg/dL (ref 70–99)
Potassium: 4.8 mEq/L (ref 3.5–5.1)
Sodium: 136 mEq/L (ref 135–145)
Total Bilirubin: 0.7 mg/dL (ref 0.2–1.2)
Total Protein: 6.8 g/dL (ref 6.0–8.3)

## 2019-08-04 LAB — LIPID PANEL
Cholesterol: 189 mg/dL (ref 0–200)
HDL: 38.2 mg/dL — ABNORMAL LOW (ref >39.00)
LDL Cholesterol: 127 mg/dL — ABNORMAL HIGH (ref 0–99)
NonHDL: 150.88
Total CHOL/HDL Ratio: 5
Triglycerides: 118 mg/dL (ref 0.0–149.0)
VLDL: 23.6 mg/dL (ref 0.0–40.0)

## 2019-08-04 LAB — HEMOGLOBIN A1C: Hgb A1c MFr Bld: 5.5 % (ref 4.6–6.5)

## 2019-08-04 LAB — PSA: PSA: 4.22 ng/mL — ABNORMAL HIGH (ref 0.10–4.00)

## 2019-08-04 MED ORDER — METOPROLOL SUCCINATE ER 50 MG PO TB24: 50.0000 mg | ORAL_TABLET | Freq: Every day | ORAL | 3 refills | Status: DC

## 2019-08-04 MED ORDER — TELMISARTAN 80 MG PO TABS: 80.0000 mg | ORAL_TABLET | Freq: Every day | ORAL | 3 refills | Status: DC

## 2019-08-04 NOTE — Progress Notes (Signed)
   Subjective:    Patient ID: Bryan Craig, male    DOB: 12-20-1950, 69 y.o.   MRN: [id number]  HPI  Pt in for follow up.  Pt has htn and high cholesterol  Pt on review has some bph. On flomax. Elevated psa and in past  I referred to urologist. He did not go stating fear due to covid concerns.   He is getting covid vaccine today.  Pt never had colonoscopy and declines referral. No fh of colon cancer.         Review of Systems  Constitutional: Negative for chills, fatigue and fever.  Respiratory: Negative for cough, chest tightness, shortness of breath and wheezing.   Cardiovascular: Negative for chest pain and palpitations.  Gastrointestinal: Negative for abdominal pain.  Genitourinary: Negative for difficulty urinating, dysuria, frequency, genital sores, hematuria, testicular pain and urgency.  Musculoskeletal: Negative for back pain.  Skin: Negative for rash.  Neurological: Negative for dizziness, seizures, speech difficulty, weakness and headaches.  Hematological: Negative for adenopathy. Does not bruise/bleed easily.  Psychiatric/Behavioral: Negative for behavioral problems and confusion.       Objective:   Physical Exam  General Mental Status- Alert. General Appearance- Not in acute distress.   Skin General: Color- Normal Color. Moisture- Normal Moisture.  Neck Carotid Arteries- Normal color. Moisture- Normal Moisture. No carotid bruits. No JVD.  Chest and Lung Exam Auscultation: Breath Sounds:-Normal.  Cardiovascular Auscultation:Rythm- Regular. Murmurs & Other Heart Sounds:Auscultation of the heart reveals- No Murmurs.  Abdomen Inspection:-Inspeection Normal. Palpation/Percussion:Note:No mass. Palpation and Percussion of the abdomen reveal- Non Tender, Non Distended + BS, no rebound or guarding.    NeurologicCranial Nerve exam:- CN III-XII intact(No nystagmus), symmetricsmile. Strength:- 5/5 equal and symmetric strength both upper and lower  extremities.      Assessment & Plan:  Your bp is well controlled on recheck. Continue current meds.  For high cholesterol, will get lipid panel and cmp.  For elevated sugar will get a1c.  For elevated psa in past will repeat today. After review will refer you back to urologist as I did last year. Important to keep appointment.  Stay at vaccine location for 30 minutes due to allergy hx.  Follow up date to be determined after lab review.  Esperanza Richters, PA-C   Time spent with patient today was 25  minutes which consisted of chart review, discussing diagnosis,  Treatment, explaining need for urology referral and documentation.

## 2019-08-04 NOTE — Patient Instructions (Addendum)
Your bp is well controlled on recheck. Continue current meds.  For high cholesterol, will get lipid panel and cmp.  For elevated sugar will get a1c.  For elevated psa in past will repeat today. After review will refer you back to urologist as I did last year. Important to keep appointment.  Stay at vaccine location for 30 minutes due to allergy hx.  Follow up date to be determined after lab review.

## 2019-08-04 NOTE — Progress Notes (Signed)
   Covid-19 Vaccination Clinic  Name:  Bryan Craig    MRN: [id number] DOB: [birth year]  08/04/2019  Mr. Bryan Craig was observed post Covid-19 immunization for 15 minutes without incident. He was provided with Vaccine Information Sheet and instruction to access the V-Safe system.   Mr. Bryan Craig was instructed to call 911 with any severe reactions post vaccine: Marland Kitchen Difficulty breathing  . Swelling of face and throat  . A fast heartbeat  . A bad rash all over body  . Dizziness and weakness   Immunizations Administered    Name Date Dose VIS Date Route   Pfizer COVID-19 Vaccine 08/04/2019  2:50 PM 0.3 mL 04/07/2019 Intramuscular   Manufacturer: [employer], [employer]   Lot: [id number]   NDC: [address]-7

## 2019-08-05 ENCOUNTER — Telehealth: Payer: Self-pay | Admitting: Medical

## 2019-08-05 DIAGNOSIS — R972 Elevated prostate specific antigen [PSA]: Secondary | ICD-10-CM

## 2019-08-05 NOTE — Telephone Encounter (Signed)
Referral to urologist placed. If I did not specify location. [facility] [address] would be good.

## 2019-08-22 ENCOUNTER — Other Ambulatory Visit: Payer: Self-pay | Admitting: Medical

## 2019-08-28 ENCOUNTER — Ambulatory Visit: Payer: Medicare Other | Attending: Internal Medicine

## 2019-08-28 DIAGNOSIS — Z23 Encounter for immunization: Secondary | ICD-10-CM

## 2019-08-28 NOTE — Progress Notes (Signed)
   Covid-19 Vaccination Clinic  Name:  Bryan Craig    MRN: [id number] DOB: 06-03-1950  08/28/2019  Mr. Wake was observed post Covid-19 immunization for 15 minutes without incident. He was provided with Vaccine Information Sheet and instruction to access the V-Safe system.   [name] was instructed to call 911 with any severe reactions post vaccine: Marland Kitchen Difficulty breathing  . Swelling of face and throat  . A fast heartbeat  . A bad rash all over body  . Dizziness and weakness   Immunizations Administered    Name Date Dose VIS Date Route   Pfizer COVID-19 Vaccine 08/28/2019  3:20 PM 0.3 mL 06/21/2018 Intramuscular   Manufacturer: [employer], [employer]   Lot: [id number]   NDC: [address]-8

## 2019-08-29 ENCOUNTER — Ambulatory Visit: Payer: Medicare Other

## 2020-07-24 ENCOUNTER — Other Ambulatory Visit: Payer: Self-pay | Admitting: Medical

## 2020-10-17 ENCOUNTER — Other Ambulatory Visit: Payer: Self-pay | Admitting: Medical

## 2020-10-29 ENCOUNTER — Other Ambulatory Visit: Payer: Self-pay | Admitting: Medical

## 2020-12-13 ENCOUNTER — Other Ambulatory Visit: Payer: Self-pay | Admitting: Medical

## 2021-01-08 ENCOUNTER — Other Ambulatory Visit: Payer: Self-pay | Admitting: Medical

## 2021-02-01 ENCOUNTER — Other Ambulatory Visit: Payer: Self-pay | Admitting: Medical

## 2021-03-03 ENCOUNTER — Emergency Department (HOSPITAL_BASED_OUTPATIENT_CLINIC_OR_DEPARTMENT_OTHER): Payer: Medicare Other

## 2021-03-03 ENCOUNTER — Encounter (HOSPITAL_BASED_OUTPATIENT_CLINIC_OR_DEPARTMENT_OTHER): Payer: Self-pay

## 2021-03-03 ENCOUNTER — Emergency Department (HOSPITAL_BASED_OUTPATIENT_CLINIC_OR_DEPARTMENT_OTHER)
Admission: EM | Admit: 2021-03-03 | Discharge: 2021-03-03 | Disposition: A | Discharge status: Home / Self Care | Payer: Medicare Other | Attending: Emergency Medicine | Admitting: Emergency Medicine

## 2021-03-03 ENCOUNTER — Other Ambulatory Visit: Payer: Self-pay

## 2021-03-03 DIAGNOSIS — Z79899 Other long term (current) drug therapy: Secondary | ICD-10-CM | POA: Diagnosis not present

## 2021-03-03 DIAGNOSIS — N1330 Unspecified hydronephrosis: Secondary | ICD-10-CM | POA: Diagnosis not present

## 2021-03-03 DIAGNOSIS — N132 Hydronephrosis with renal and ureteral calculous obstruction: Secondary | ICD-10-CM

## 2021-03-03 DIAGNOSIS — I10 Essential (primary) hypertension: Secondary | ICD-10-CM | POA: Diagnosis not present

## 2021-03-03 DIAGNOSIS — N400 Enlarged prostate without lower urinary tract symptoms: Secondary | ICD-10-CM

## 2021-03-03 DIAGNOSIS — N401 Enlarged prostate with lower urinary tract symptoms: Secondary | ICD-10-CM | POA: Insufficient documentation

## 2021-03-03 DIAGNOSIS — R7989 Other specified abnormal findings of blood chemistry: Secondary | ICD-10-CM | POA: Diagnosis not present

## 2021-03-03 DIAGNOSIS — R1031 Right lower quadrant pain: Secondary | ICD-10-CM | POA: Diagnosis present

## 2021-03-03 DIAGNOSIS — N201 Calculus of ureter: Secondary | ICD-10-CM | POA: Insufficient documentation

## 2021-03-03 LAB — URINALYSIS, MICROSCOPIC (REFLEX)

## 2021-03-03 LAB — URINALYSIS, ROUTINE W REFLEX MICROSCOPIC
Bilirubin Urine: NEGATIVE
Glucose, UA: NEGATIVE mg/dL
Ketones, ur: >=80 mg/dL — AB
Nitrite: NEGATIVE
Protein, ur: NEGATIVE mg/dL
Specific Gravity, Urine: 1.025 (ref 1.005–1.030)
pH: 6 (ref 5.0–8.0)

## 2021-03-03 LAB — COMPREHENSIVE METABOLIC PANEL
ALT: 19 U/L (ref 0–44)
AST: 24 U/L (ref 15–41)
Albumin: 4.2 g/dL (ref 3.5–5.0)
Alkaline Phosphatase: 61 U/L (ref 38–126)
Anion gap: 13 (ref 5–15)
BUN: 16 mg/dL (ref 8–23)
CO2: 19 mmol/L — ABNORMAL LOW (ref 22–32)
Calcium: 9.5 mg/dL (ref 8.9–10.3)
Chloride: 102 mmol/L (ref 98–111)
Creatinine, Ser: 1.29 mg/dL — ABNORMAL HIGH (ref 0.61–1.24)
GFR, Estimated: 59 mL/min — ABNORMAL LOW (ref >60)
Glucose, Bld: 182 mg/dL — ABNORMAL HIGH (ref 70–99)
Potassium: 4 mmol/L (ref 3.5–5.1)
Sodium: 134 mmol/L — ABNORMAL LOW (ref 135–145)
Total Bilirubin: 0.7 mg/dL (ref 0.3–1.2)
Total Protein: 7.1 g/dL (ref 6.5–8.1)

## 2021-03-03 LAB — CBC
HCT: 42.8 % (ref 39.0–52.0)
Hemoglobin: 15.2 g/dL (ref 13.0–17.0)
MCH: 31.1 pg (ref 26.0–34.0)
MCHC: 35.5 g/dL (ref 30.0–36.0)
MCV: 87.7 fL (ref 80.0–100.0)
Platelets: 245 10*3/uL (ref 150–400)
RBC: 4.88 MIL/uL (ref 4.22–5.81)
RDW: 13.1 % (ref 11.5–15.5)
WBC: 8.8 10*3/uL (ref 4.0–10.5)
nRBC: 0 % (ref 0.0–0.2)

## 2021-03-03 LAB — LIPASE, BLOOD: Lipase: 32 U/L (ref 11–51)

## 2021-03-03 MED ORDER — SODIUM CHLORIDE 0.9 % IV BOLUS
500.0000 mL | Freq: Once | INTRAVENOUS | Status: AC
  Administered 2021-03-03: 500 mL via INTRAVENOUS

## 2021-03-03 MED ORDER — MORPHINE SULFATE (PF) 4 MG/ML IV SOLN
4.0000 mg | Freq: Once | INTRAVENOUS | Status: AC
  Administered 2021-03-03: 4 mg via INTRAVENOUS
  Filled 2021-03-03: qty 1

## 2021-03-03 MED ORDER — KETOROLAC TROMETHAMINE 30 MG/ML IJ SOLN
15.0000 mg | Freq: Once | INTRAMUSCULAR | Status: AC
  Administered 2021-03-03: 15 mg via INTRAVENOUS
  Filled 2021-03-03: qty 1

## 2021-03-03 MED ORDER — IOHEXOL 300 MG/ML  SOLN
100.0000 mL | Freq: Once | INTRAMUSCULAR | Status: AC | PRN
  Administered 2021-03-03: 100 mL via INTRAVENOUS

## 2021-03-03 MED ORDER — OXYCODONE-ACETAMINOPHEN 5-325 MG PO TABS
1.0000 | ORAL_TABLET | Freq: Four times a day (QID) | ORAL | 0 refills | Status: DC | PRN
Start: 2021-03-03 — End: 2022-07-21

## 2021-03-03 MED ORDER — ONDANSETRON 4 MG PO TBDP: 4.0000 mg | ORAL_TABLET | Freq: Three times a day (TID) | ORAL | 0 refills | Status: DC | PRN

## 2021-03-03 MED ORDER — TAMSULOSIN HCL 0.4 MG PO CAPS: 0.4000 mg | ORAL_CAPSULE | Freq: Every day | ORAL | 0 refills | Status: AC

## 2021-03-03 NOTE — Discharge Instructions (Addendum)
Please pick up medications and take as prescribed. Continue drinking lots of water to stay hydrated/flush out the kidney stone.   Follow up with [facility] for further evaluation of your recurrent kidney stones as well as finding of prostate enlargement seen on CT scan today.   Your kidney function was slightly elevated today; likely due to the kidney stone. Please have your kidney function (creatinine level) rechecked in 1-2 weeks by your PCP.   Return to the ED for any worsening symptoms including worsening pain, inability to urinate, fevers > 100.4.

## 2021-03-03 NOTE — ED Provider Notes (Signed)
[facility] EMERGENCY DEPARTMENT Provider Note   CSN: [id number] Arrival date & time: 03/03/21  1517     History Chief Complaint  Patient presents with   Abdominal Pain    Bryan Craig is a 70 y.o. male with PMHx HTN and HLD who presents to the ED Today with complaint of sudden onset, constant, sharp/stabbing, RLQ pain that began earlier this morning.  His wife reports that he was sitting on the toilet having a bowel movement when he began having severe pain.  He denies diarrhea however states that his bowel movement was softer than normal.  He denies any nausea or vomiting.  He does report history of kidney stones in the past however states that this is more severe than normal kidney stone pain.  He has always passed his kidney stones on his own.  He denies any fevers or chills associated with the pain.  No past surgical history to abdomen.  No recent sick contacts.   The history is provided by the patient and medical records.      Past Medical History:  Diagnosis Date   Hyperlipidemia    Hypertension    Kidney stone    Retinal detachment     Patient Active Problem List   Diagnosis Date Noted   HYPERLIPIDEMIA 06/19/2010   HYPERTENSION 06/19/2010   RASH-NONVESICULAR 06/19/2010    Past Surgical History:  Procedure Laterality Date   EYE SURGERY         History reviewed. No pertinent family history.  Social History   Tobacco Use   Smoking status: Never   Smokeless tobacco: Never  Substance Use Topics   Alcohol use: Yes    Comment: rare occasional social drinker.   Drug use: Never    Home Medications Prior to Admission medications   Medication Sig Start Date End Date Taking? Authorizing Provider  ondansetron (ZOFRAN ODT) 4 MG disintegrating tablet Take 1 tablet (4 mg total) by mouth every 8 (eight) hours as needed for nausea or vomiting. 03/03/21  Yes Rakeb Kibble, PA-C  oxyCODONE-acetaminophen (PERCOCET/ROXICET) 5-325 MG tablet Take 1 tablet by  mouth every 6 (six) hours as needed for severe pain. 03/03/21  Yes Amun Stemm, PA-C  tamsulosin (FLOMAX) 0.4 MG CAPS capsule Take 1 capsule (0.4 mg total) by mouth daily for 7 days. 03/03/21 03/10/21 Yes Denim Start, PA-C  atorvastatin (LIPITOR) 20 MG tablet TAKE 1 TABLET BY MOUTH EVERY DAY 08/22/19   Saguier, Ramon Dredge, PA-C  Difluprednate 0.05 % EMUL Administer 1 drop into the left eye daily. 08/06/15   [provider]  famciclovir (FAMVIR) 500 MG tablet Take 1 tablet (500 mg total) by mouth 3 (three) times daily. Patient not taking: Reported on 08/04/2019 06/22/18   Saguier, Ramon Dredge, PA-C  HYDROmorphone (DILAUDID) 4 MG tablet Take 0.5-1 tablets (2-4 mg total) by mouth every 4 (four) hours as needed for severe pain. Patient not taking: Reported on 08/04/2019 04/06/14   Molpus, John, MD  metoprolol succinate (TOPROL-XL) 50 MG 24 hr tablet Take 1 tablet (50 mg total) by mouth daily. 10/17/20   Saguier, Ramon Dredge, PA-C  ondansetron (ZOFRAN ODT) 8 MG disintegrating tablet Take 1 tablet (8 mg total) by mouth every 8 (eight) hours as needed for nausea or vomiting. Patient not taking: Reported on 08/04/2019 04/06/14   Molpus, John, MD  tamsulosin (FLOMAX) 0.4 MG CAPS capsule Take 1 capsule daily until stone passes. Patient not taking: Reported on 08/04/2019 04/06/14   Molpus, John, MD  telmisartan (MICARDIS) 80 MG tablet  TAKE 1 TABLET BY MOUTH EVERY DAY 02/03/21   Saguier, Percell Miller, PA-C    Allergies    Lisinopril and Penicillins  Review of Systems   Review of Systems  Constitutional:  Negative for chills and fever.  Gastrointestinal:  Positive for abdominal pain. Negative for nausea and vomiting.  Genitourinary:  Negative for dysuria, frequency and hematuria.  All other systems reviewed and are negative.  Physical Exam Updated Vital Signs BP (!) 158/92   Pulse 79   Temp (!) 97.5 F (36.4 C) (Oral)   Resp 15   Ht 5\' 8"  (1.727 m)   Wt 81.6 kg   SpO2 98%   BMI 27.37 kg/m   Physical  Exam Vitals and nursing note reviewed.  Constitutional:      Appearance: He is not ill-appearing or diaphoretic.     Comments: Uncomfortable appearing male. Writhing around in bed, holding onto his right side  HENT:     Head: Normocephalic and atraumatic.  Eyes:     Conjunctiva/sclera: Conjunctivae normal.  Cardiovascular:     Rate and Rhythm: Normal rate and regular rhythm.     Heart sounds: Normal heart sounds.  Pulmonary:     Effort: Pulmonary effort is normal.     Breath sounds: Normal breath sounds. No wheezing, rhonchi or rales.  Abdominal:     Palpations: Abdomen is soft.     Tenderness: There is abdominal tenderness in the right lower quadrant, periumbilical area and suprapubic area. There is no right CVA tenderness, left CVA tenderness, guarding or rebound.  Musculoskeletal:     Cervical back: Neck supple.  Skin:    General: Skin is warm and dry.  Neurological:     Mental Status: He is alert.    ED Results / Procedures / Treatments   Labs (all labs ordered are listed, but only abnormal results are displayed) Labs Reviewed  COMPREHENSIVE METABOLIC PANEL - Abnormal; Notable for the following components:      Result Value   Sodium 134 (*)    CO2 19 (*)    Glucose, Bld 182 (*)    Creatinine, Ser 1.29 (*)    GFR, Estimated 59 (*)    All other components within normal limits  URINALYSIS, ROUTINE W REFLEX MICROSCOPIC - Abnormal; Notable for the following components:   Hgb urine dipstick MODERATE (*)    Ketones, ur >=80 (*)    Leukocytes,Ua TRACE (*)    All other components within normal limits  URINALYSIS, MICROSCOPIC (REFLEX) - Abnormal; Notable for the following components:   Bacteria, UA RARE (*)    All other components within normal limits  LIPASE, BLOOD  CBC    EKG None  Radiology CT Abdomen Pelvis W Contrast  Result Date: 03/03/2021 CLINICAL DATA:  Right lower quadrant abdominal/flank pain. Evaluate for kidney stone or appendicitis EXAM: CT ABDOMEN AND  PELVIS WITH CONTRAST TECHNIQUE: Multidetector CT imaging of the abdomen and pelvis was performed using the standard protocol following bolus administration of intravenous contrast. CONTRAST:  114mL OMNIPAQUE IOHEXOL 300 MG/ML  SOLN COMPARISON:  Prior CT scan of the abdomen 04/27/2016 and 04/06/2014 FINDINGS: Lower chest: Small 2 mm right middle lobe pulmonary nodule (image 9 series 4) is stable dating back to 2018 and therefore benign. Atherosclerotic plaques present along the coronary arteries. The heart is normal in size. Normal distal thoracic esophagus. Hepatobiliary: No focal liver abnormality is seen. No gallstones, gallbladder wall thickening, or biliary dilatation. Pancreas: Unremarkable. No pancreatic ductal dilatation or surrounding inflammatory changes. Spleen:  Normal in size without focal abnormality. Adrenals/Urinary Tract: Normal adrenal glands. Moderate right hydronephrosis, renal edema and perinephric stranding. The ureter remains dilated to the UVJ where there is a 4 mm obstructing stone. Additional nonobstructing nephrolithiasis present on the left including a 6 mm stone in the interpolar collecting system and a 3 mm stone in the lower pole collecting system. Additional punctate nonobstructing stones present in the right upper and interpolar collecting system. The bladder is decompressed. Stomach/Bowel: Stomach is within normal limits. Appendix appears normal. No evidence of bowel wall thickening, distention, or inflammatory changes. Vascular/Lymphatic: Mild atherosclerotic calcifications along the infrarenal abdominal aorta. No evidence of aneurysm. Retroaortic left renal vein. No suspicious lymphadenopathy. Reproductive: Massive prostatomegaly. The prostate gland measures 7 x 6 5.4 cm (volume = 100 cm^3). Other: No abdominal wall hernia or abnormality. No abdominopelvic ascites. Musculoskeletal: No acute or significant osseous findings. IMPRESSION: 1. Obstructing 4 mm stone at the right UVJ  resulting in moderate right hydroureteronephrosis, renal edema and perinephric stranding. 2. Additional nonobstructing nephrolithiasis bilaterally with larger stones on the left than the right. 3. Normal appearance of the appendix. 4. Aortic and coronary artery atherosclerotic calcifications. 5. Marked prostatomegaly. The prostate gland is approximately 100 g in size. Aortic Atherosclerosis (ICD10-I70.0). Electronically Signed   By: Jacqulynn Cadet M.D.   On: 03/03/2021 11:28    Procedures Procedures   Medications Ordered in ED Medications  morphine 4 MG/ML injection 4 mg (4 mg Intravenous Given 03/03/21 1008)  iohexol (OMNIPAQUE) 300 MG/ML solution 100 mL (100 mLs Intravenous Contrast Given 03/03/21 1102)  sodium chloride 0.9 % bolus 500 mL (500 mLs Intravenous New Bag/Given 03/03/21 1147)  ketorolac (TORADOL) 30 MG/ML injection 15 mg (15 mg Intravenous Given 03/03/21 1147)    ED Course  I have reviewed the triage vital signs and the nursing notes.  Pertinent labs & imaging results that were available during my care of the patient were reviewed by me and considered in my medical decision making (see chart for details).    MDM Rules/Calculators/A&P                           70 year old male who presents to the ED today with complaint of right lower quadrant abdominal pain that began earlier this morning.  No nausea, vomiting, diarrhea, fevers.  On arrival to the ED today patient is noted to be slightly tachypneic with respirations at 26 however I suspect this is secondary to pain.  Remainder vitals are unremarkable.  He is found writhing around in bed holding onto his right side.  He is noted to be tender to palpation along the right lower quadrant as well as periumbilical area.  There is no obvious right CVA tenderness palpation.  He does have history of kidney stones.  Concern for possible appendicitis versus kidney stone versus other intra-abdominal source.  We will plan for lab work and CT  abdomen and pelvis.  Pain medication provided.  CBC without leukocytosis with white blood cell count of 8.8.  Hemoglobin stable at 15.2.  CMP with slight increase in kidney function at 1.29, BUN 16. Glucose 182, bicarb 19, no gap. Does not seem consistent with DKA and pt without hx of diabetes.   Lab Results  Component Value Date   CREATININE 1.29 (H) 03/03/2021   CREATININE 0.96 08/04/2019   CREATININE 0.98 06/14/2018   Lipase 32 U/A with moderate hgb on dipstick, trace leuks, 11-20 RBCs and rare bacteria.  CT: IMPRESSION:  1. Obstructing 4 mm stone at the right UVJ resulting in moderate  right hydroureteronephrosis, renal edema and perinephric stranding.  2. Additional nonobstructing nephrolithiasis bilaterally with larger  stones on the left than the right.  3. Normal appearance of the appendix.  4. Aortic and coronary artery atherosclerotic calcifications.  5. Marked prostatomegaly. The prostate gland is approximately 100 g  in size.     Aortic Atherosclerosis (ICD10-I70.0).      On reevaluation pt reports mild improvement in pain with morphine. 15 mg Toradol IV provided with significant improvement in symptoms.  Will discharge pt home at this time with urology follow up for both kidney stone as well as prostatomegaly. He and his wife are in agreement with plan. Will discharge with percocet, zofran, and flomax. Pt instructed to return to the ED for any fevers, urinary retention, worsening pain. They are in agreement and stable for discharge.   This note was prepared using Dragon voice recognition software and may include unintentional dictation errors due to the inherent limitations of voice recognition software.    Final Clinical Impression(s) / ED Diagnoses Final diagnoses:  Ureteral stone with hydronephrosis  Prostate enlargement  Elevated serum creatinine    Rx / DC Orders ED Discharge Orders          Ordered    oxyCODONE-acetaminophen (PERCOCET/ROXICET) 5-325  MG tablet  Every 6 hours PRN        03/03/21 1225    ondansetron (ZOFRAN ODT) 4 MG disintegrating tablet  Every 8 hours PRN        03/03/21 1225    tamsulosin (FLOMAX) 0.4 MG CAPS capsule  Daily        03/03/21 1225             Discharge Instructions      Please pick up medications and take as prescribed. Continue drinking lots of water to stay hydrated/flush out the kidney stone.   Follow up with [facility] for further evaluation of your recurrent kidney stones as well as finding of prostate enlargement seen on CT scan today.   Your kidney function was slightly elevated today; likely due to the kidney stone. Please have your kidney function (creatinine level) rechecked in 1-2 weeks by your PCP.   Return to the ED for any worsening symptoms including worsening pain, inability to urinate, fevers > 100.4.        Eustaquio Maize, PA-C 03/03/21 [place], MD 03/04/21 [phone]

## 2021-03-03 NOTE — ED Triage Notes (Signed)
Pt started having severe RLQ abdominal pain after eating breakfast this morning. Hx of kidney stones. Right sided tenderness. States feels different than kidney stone.

## 2021-03-03 NOTE — ED Notes (Signed)
ED Provider at bedside. 

## 2021-03-04 ENCOUNTER — Other Ambulatory Visit: Payer: Self-pay | Admitting: Medical

## 2021-03-28 ENCOUNTER — Other Ambulatory Visit: Payer: Self-pay | Admitting: Medical

## 2021-04-24 ENCOUNTER — Other Ambulatory Visit: Payer: Self-pay | Admitting: Medical

## 2021-11-03 ENCOUNTER — Ambulatory Visit (INDEPENDENT_AMBULATORY_CARE_PROVIDER_SITE_OTHER): Payer: Medicare Other | Admitting: Medical

## 2021-11-03 VITALS — BP 140/86 | HR 65 | Temp 98.2°F | Resp 18 | Ht 68.0 in | Wt 182.0 lb

## 2021-11-03 DIAGNOSIS — R21 Rash and other nonspecific skin eruption: Secondary | ICD-10-CM | POA: Diagnosis not present

## 2021-11-03 DIAGNOSIS — L309 Dermatitis, unspecified: Secondary | ICD-10-CM

## 2021-11-03 MED ORDER — PREDNISONE 10 MG (21) PO TBPK: ORAL_TABLET | ORAL | 0 refills | Status: DC

## 2021-11-03 NOTE — Patient Instructions (Addendum)
Elbow rash that appears like eczema. Other area may represent eczema or diffuse allergic reaction.  Will rx 6 day taper prednisone. Recommend use dove moisturizing soap and shower at most 2 showers.  Use triamcinolone to elbows twice daily.  Follow up in 7-10 or sooner if needed. If rash not subsiding/improving then refer to dermatologist.

## 2021-11-03 NOTE — Progress Notes (Signed)
Subjective:    Patient ID: Bryan Craig, male    DOB: Feb 04, 1951, 71 y.o.   MRN: [id number]  HPI Pt has rash on and off for 2 weeks.   States first saw on his elbows and then saw on upper thigh, upper chest and upper back as well.   Pt sent to UC and given prednisone  20mg   for 6 days and trimacinolone   This is first time that pt has had rash like this. On review no supicious exposures. No itching. No recent new meds befoe rash. No antibiotic. No new soaps creams or detergent before rash. Occurred.   UC notes that I can see  are below in ".    "Patient presents today with the chief complaint of rash that starte by ankles and has spread through entire body sparing palms, face and soles of feet. They are not itchy, no fever or chills. No known irritant contact. No recent travel.   Rash Pertinent negatives include no cough, fever, shortness of breath or sore throat.    Discussed that I am unsure of the etiology of these lesions. Differentials may include Lichens Sclerosus, nummular eczema, et al. Recommend follow up with primary care physician next week as this has potential of having a systemic cause. Recommend follow up with dermatologist and primary care physician can possibly try to get a sooner appointment with dermatologist. Will try prednisone and triamcinolone cream."  On review pt given atypical lower dose. Finished last Monday.      Review of Systems  Constitutional:  Negative for chills, fatigue and fever.  HENT:  Negative for congestion and drooling.   Respiratory:  Negative for chest tightness, shortness of breath and wheezing.   Cardiovascular:  Negative for chest pain and palpitations.  Gastrointestinal:  Negative for abdominal pain.  Genitourinary:  Negative for dysuria and frequency.  Musculoskeletal:  Negative for back pain and neck pain.  Skin:  Positive for rash.  Neurological:  Negative for dizziness and headaches.  Hematological:  Negative for  adenopathy. Does not bruise/bleed easily.  Psychiatric/Behavioral:  Negative for behavioral problems and dysphoric mood.     Past Medical History:  Diagnosis Date   Hyperlipidemia    Hypertension    Kidney stone    Retinal detachment      Social History   Socioeconomic History   Marital status: Married    Spouse name: Not on file   Number of children: Not on file   Years of education: Not on file   Highest education level: Not on file  Occupational History   Not on file  Tobacco Use   Smoking status: Never   Smokeless tobacco: Never  Substance and Sexual Activity   Alcohol use: Yes    Comment: rare occasional social drinker.   Drug use: Never   Sexual activity: Yes  Other Topics Concern   Not on file  Social History Narrative   Not on file   Social Determinants of Health   Financial Resource Strain: Not on file  Food Insecurity: Not on file  Transportation Needs: Not on file  Physical Activity: Not on file  Stress: Not on file  Social Connections: Not on file  Intimate Partner Violence: Not on file    Past Surgical History:  Procedure Laterality Date   EYE SURGERY      No family history on file.  Allergies  Allergen Reactions   Lisinopril     REACTION: cough   Penicillins  Current Outpatient Medications on File Prior to Visit  Medication Sig Dispense Refill   atorvastatin (LIPITOR) 20 MG tablet TAKE 1 TABLET BY MOUTH EVERY DAY 90 tablet 0   Difluprednate 0.05 % EMUL Administer 1 drop into the left eye daily.     famciclovir (FAMVIR) 500 MG tablet Take 1 tablet (500 mg total) by mouth 3 (three) times daily. (Patient not taking: Reported on 08/04/2019) 21 tablet 0   HYDROmorphone (DILAUDID) 4 MG tablet Take 0.5-1 tablets (2-4 mg total) by mouth every 4 (four) hours as needed for severe pain. (Patient not taking: Reported on 08/04/2019) 30 tablet 0   metoprolol succinate (TOPROL-XL) 50 MG 24 hr tablet Take 1 tablet (50 mg total) by mouth daily. 30 tablet  0   ondansetron (ZOFRAN ODT) 4 MG disintegrating tablet Take 1 tablet (4 mg total) by mouth every 8 (eight) hours as needed for nausea or vomiting. 20 tablet 0   ondansetron (ZOFRAN ODT) 8 MG disintegrating tablet Take 1 tablet (8 mg total) by mouth every 8 (eight) hours as needed for nausea or vomiting. (Patient not taking: Reported on 08/04/2019) 20 tablet 0   oxyCODONE-acetaminophen (PERCOCET/ROXICET) 5-325 MG tablet Take 1 tablet by mouth every 6 (six) hours as needed for severe pain. 15 tablet 0   tamsulosin (FLOMAX) 0.4 MG CAPS capsule Take 1 capsule daily until stone passes. (Patient not taking: Reported on 08/04/2019) 30 capsule 0   telmisartan (MICARDIS) 80 MG tablet TAKE 1 TABLET (80 MG TOTAL) BY MOUTH DAILY. MUST SEE DOCTOR FOR MORE REFILLS 30 tablet 0   No current facility-administered medications on file prior to visit.    BP 140/86   Pulse 65   Temp 98.2 F (36.8 C)   Resp 18   Ht 5\' 8"  (1.727 m)   Wt 182 lb (82.6 kg)   SpO2 98%   BMI 27.67 kg/m        Objective:   Physical Exam  General- No acute distress. Pleasant patient. Neck- Full range of motion, no jvd Lungs- Clear, even and unlabored. Heart- regular rate and rhythm. Neurologic- CNII- XII grossly intact.  Skin- on elbow has appearance of whtish lichinied rash .4 circular red area medial thighs. 2 dry flaky area of skin rt lateral ankle. Similar area scattered anterior chest and back. Some papular appearance to lower back.     Assessment & Plan:   Patient Instructions  Elbow rash that appears like eczema. Other area may represent eczema or diffuse allergic reaction.  Will rx 6 day taper prednisone. Recommend use dove moisturizing soap and shower at most 2 showers.  Use triamcinolone to elbows twice daily.  Follow up in 7-10 or sooner if needed. If rash not subsiding/improving then refer to dermatologist.    9-10, PA-C

## 2021-11-11 ENCOUNTER — Encounter: Payer: Self-pay | Admitting: Medical

## 2021-11-11 ENCOUNTER — Ambulatory Visit: Payer: Medicare Other | Admitting: Medical

## 2021-11-11 VITALS — BP 138/78 | HR 72 | Resp 18 | Ht 68.0 in | Wt 180.0 lb

## 2021-11-11 DIAGNOSIS — E785 Hyperlipidemia, unspecified: Secondary | ICD-10-CM

## 2021-11-11 DIAGNOSIS — I10 Essential (primary) hypertension: Secondary | ICD-10-CM | POA: Diagnosis not present

## 2021-11-11 DIAGNOSIS — R21 Rash and other nonspecific skin eruption: Secondary | ICD-10-CM

## 2021-11-11 MED ORDER — METHYLPREDNISOLONE 4 MG PO TABS: ORAL_TABLET | ORAL | 0 refills | Status: DC

## 2021-11-11 MED ORDER — TELMISARTAN 80 MG PO TABS: 80.0000 mg | ORAL_TABLET | Freq: Every day | ORAL | 3 refills | Status: DC

## 2021-11-11 MED ORDER — TRIAMCINOLONE ACETONIDE 0.1 % EX CREA: 1.0000 | TOPICAL_CREAM | Freq: Two times a day (BID) | CUTANEOUS | 0 refills | Status: AC

## 2021-11-11 MED ORDER — METOPROLOL SUCCINATE ER 50 MG PO TB24: 50.0000 mg | ORAL_TABLET | Freq: Every day | ORAL | 3 refills | Status: DC

## 2021-11-11 NOTE — Patient Instructions (Addendum)
Persisting rash with only partial improvement of 6 day taper and topical triamcinolone. Since some improvement but not completely will add 6 day taper medrol and refill triamcinolone. Also went ahead and placed referral to dermatologist and may benefit from biopsy for exact diagnosis.  Let me know when you have appointment with dermatologist.  Htn- bp reasonably controlled today. Refilled both telmisartan and metoprolol today.  High cholesterol- placed future lipid panel and cmp to be done within 2 weeks fasting.  Follow up 3 months or sooner if needed.

## 2021-11-11 NOTE — Progress Notes (Signed)
Subjective:    Patient ID: Bryan Craig, male    DOB: February 26, 1951, 71 y.o.   MRN: [id number]  HPI  Pt in for follow up.   Pt states rash overall some better but the area are still persisting despite oral 6 day prednisone taper and triamcinolone.  Below was last visit A/P  "Elbow rash that appears like eczema. Other area may represent eczema or diffuse allergic reaction.   Will rx 6 day taper prednisone. Recommend use dove moisturizing soap and shower at most 2 showers.   Use triamcinolone to elbows twice daily.   Follow up in 7-10 or sooner if needed. If rash not"  On review of last physical exam and recheck today the elbows have improved the most but other areas only minimal improvement.   Htn-  on micardis 80 mg q day.toprol xl 50 mg daily.  Pt ate beef today. High cholesterol- on atorvastatin 20 mg daily.    Review of Systems  Constitutional:  Negative for chills and fatigue.  Respiratory:  Negative for cough, chest tightness, shortness of breath and wheezing.   Cardiovascular:  Negative for chest pain and palpitations.  Gastrointestinal:  Negative for abdominal pain, constipation and nausea.  Skin:  Positive for rash.   Past Medical History:  Diagnosis Date   Hyperlipidemia    Hypertension    Kidney stone    Retinal detachment      Social History   Socioeconomic History   Marital status: Married    Spouse name: Not on file   Number of children: Not on file   Years of education: Not on file   Highest education level: Not on file  Occupational History   Not on file  Tobacco Use   Smoking status: Never   Smokeless tobacco: Never  Substance and Sexual Activity   Alcohol use: Yes    Comment: rare occasional social drinker.   Drug use: Never   Sexual activity: Yes  Other Topics Concern   Not on file  Social History Narrative   Not on file   Social Determinants of Health   Financial Resource Strain: Not on file  Food Insecurity: Not on file   Transportation Needs: Not on file  Physical Activity: Not on file  Stress: Not on file  Social Connections: Not on file  Intimate Partner Violence: Not on file    Past Surgical History:  Procedure Laterality Date   EYE SURGERY      No family history on file.  Allergies  Allergen Reactions   Lisinopril     REACTION: cough   Penicillins     Current Outpatient Medications on File Prior to Visit  Medication Sig Dispense Refill   atorvastatin (LIPITOR) 20 MG tablet TAKE 1 TABLET BY MOUTH EVERY DAY 90 tablet 0   Difluprednate 0.05 % EMUL Administer 1 drop into the left eye daily.     famciclovir (FAMVIR) 500 MG tablet Take 1 tablet (500 mg total) by mouth 3 (three) times daily. 21 tablet 0   HYDROmorphone (DILAUDID) 4 MG tablet Take 0.5-1 tablets (2-4 mg total) by mouth every 4 (four) hours as needed for severe pain. 30 tablet 0   metoprolol succinate (TOPROL-XL) 50 MG 24 hr tablet Take 1 tablet (50 mg total) by mouth daily. 30 tablet 0   ondansetron (ZOFRAN ODT) 4 MG disintegrating tablet Take 1 tablet (4 mg total) by mouth every 8 (eight) hours as needed for nausea or vomiting. 20 tablet 0   ondansetron (  ZOFRAN ODT) 8 MG disintegrating tablet Take 1 tablet (8 mg total) by mouth every 8 (eight) hours as needed for nausea or vomiting. 20 tablet 0   oxyCODONE-acetaminophen (PERCOCET/ROXICET) 5-325 MG tablet Take 1 tablet by mouth every 6 (six) hours as needed for severe pain. 15 tablet 0   predniSONE (STERAPRED UNI-PAK 21 TAB) 10 MG (21) TBPK tablet Taper over 6 days. 21 tablet 0   tamsulosin (FLOMAX) 0.4 MG CAPS capsule Take 1 capsule daily until stone passes. 30 capsule 0   telmisartan (MICARDIS) 80 MG tablet TAKE 1 TABLET (80 MG TOTAL) BY MOUTH DAILY. MUST SEE DOCTOR FOR MORE REFILLS 30 tablet 0   No current facility-administered medications on file prior to visit.    BP (!) 145/74   Pulse 72   Resp 18   Ht 5\' 8"  (1.727 m)   Wt 180 lb (81.6 kg)   SpO2 98%   BMI 27.37 kg/m         Objective:   Physical Exam  General- No acute distress. Pleasant patient. Neck- Full range of motion, no jvd Lungs- Clear, even and unlabored. Heart- regular rate and rhythm. Neurologic- CNII- XII grossly intact.  Skin- on elbow has appearance of whtish lichinied rash .2 circular red area rt medial thighs. 4 areas of circular rash  2 dry flaky area of skin rt lateral ankle. Similar area scattered anterior chest and back. Some papular appearance to lower back.      Assessment & Plan:    Patient Instructions  Persisting rash with only partial improvement of 6 day taper and topical triamcinolone. Since some improvement but not completely will add 6 day taper medrol and refill triamcinolone. Also went ahead and placed referral to dermatologist and may benefit from biopsy for exact diagnosis.  Let me know when you have appointment with dermatologist.  Htn- bp reasonably controlled today. Refilled both telmisartan and metoprolol today.  High cholesterol- placed future lipid panel and cmp to be done within 2 weeks fasting.  Follow up 3 months or sooner if needed.   , PA-C

## 2021-11-18 ENCOUNTER — Ambulatory Visit: Payer: Medicare Other | Admitting: Medical

## 2021-11-18 ENCOUNTER — Telehealth: Payer: Self-pay | Admitting: Medical

## 2021-11-18 ENCOUNTER — Encounter: Payer: Self-pay | Admitting: Medical

## 2021-11-18 DIAGNOSIS — S50311A Abrasion of right elbow, initial encounter: Secondary | ICD-10-CM | POA: Diagnosis not present

## 2021-11-18 DIAGNOSIS — Z23 Encounter for immunization: Secondary | ICD-10-CM | POA: Diagnosis not present

## 2021-11-18 DIAGNOSIS — I10 Essential (primary) hypertension: Secondary | ICD-10-CM | POA: Diagnosis not present

## 2021-11-18 DIAGNOSIS — S0081XA Abrasion of other part of head, initial encounter: Secondary | ICD-10-CM | POA: Diagnosis not present

## 2021-11-18 DIAGNOSIS — T148XXA Other injury of unspecified body region, initial encounter: Secondary | ICD-10-CM
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
## 2021-11-18 NOTE — Addendum Note (Signed)
Addended by: Maximino Sarin on: 11/18/2021 03:40 PM   Modules accepted: Orders
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
## 2021-11-18 NOTE — Telephone Encounter (Signed)
Donata Clay (daughter) called to let Ramon Dredge know that he has an appt scheduled with the dermatologist on 8.9.23 @ 8:30a.

## 2021-11-25 ENCOUNTER — Other Ambulatory Visit (INDEPENDENT_AMBULATORY_CARE_PROVIDER_SITE_OTHER): Payer: Medicare Other

## 2021-11-25 DIAGNOSIS — I10 Essential (primary) hypertension: Secondary | ICD-10-CM | POA: Diagnosis not present

## 2021-11-25 DIAGNOSIS — E785 Hyperlipidemia, unspecified: Secondary | ICD-10-CM

## 2021-11-25 LAB — COMPREHENSIVE METABOLIC PANEL
ALT: 19 U/L (ref 0–53)
AST: 15 U/L (ref 0–37)
Albumin: 4.1 g/dL (ref 3.5–5.2)
Alkaline Phosphatase: 71 U/L (ref 39–117)
BUN: 12 mg/dL (ref 6–23)
CO2: 30 mEq/L (ref 19–32)
Calcium: 9.3 mg/dL (ref 8.4–10.5)
Chloride: 102 mEq/L (ref 96–112)
Creatinine, Ser: 1.03 mg/dL (ref 0.40–1.50)
GFR: 72.69 mL/min (ref >60.00)
Glucose, Bld: 101 mg/dL — ABNORMAL HIGH (ref 70–99)
Potassium: 4.9 mEq/L (ref 3.5–5.1)
Sodium: 138 mEq/L (ref 135–145)
Total Bilirubin: 0.6 mg/dL (ref 0.2–1.2)
Total Protein: 6.5 g/dL (ref 6.0–8.3)

## 2021-11-25 LAB — LIPID PANEL
Cholesterol: 203 mg/dL — ABNORMAL HIGH (ref 0–200)
HDL: 42.2 mg/dL (ref >39.00)
LDL Cholesterol: 138 mg/dL — ABNORMAL HIGH (ref 0–99)
NonHDL: 160.57
Total CHOL/HDL Ratio: 5
Triglycerides: 114 mg/dL (ref 0.0–149.0)
VLDL: 22.8 mg/dL (ref 0.0–40.0)

## 2021-11-25 MED ORDER — ATORVASTATIN CALCIUM 20 MG PO TABS: 20.0000 mg | ORAL_TABLET | Freq: Every day | ORAL | 3 refills | Status: DC

## 2021-11-25 NOTE — Addendum Note (Signed)
Addended by: Gwenevere Abbot on: 11/25/2021 09:38 PM   Modules accepted: Orders

## 2022-02-11 ENCOUNTER — Ambulatory Visit: Payer: Medicare Other | Admitting: Medical

## 2022-02-13 ENCOUNTER — Emergency Department (HOSPITAL_BASED_OUTPATIENT_CLINIC_OR_DEPARTMENT_OTHER)
Admission: EM | Admit: 2022-02-13 | Discharge: 2022-02-13 | Disposition: A | Discharge status: Home / Self Care | Payer: Medicare Other | Attending: Emergency Medicine | Admitting: Emergency Medicine

## 2022-02-13 ENCOUNTER — Encounter (HOSPITAL_BASED_OUTPATIENT_CLINIC_OR_DEPARTMENT_OTHER): Payer: Self-pay | Admitting: Emergency Medicine

## 2022-02-13 ENCOUNTER — Emergency Department (HOSPITAL_BASED_OUTPATIENT_CLINIC_OR_DEPARTMENT_OTHER): Payer: Medicare Other

## 2022-02-13 DIAGNOSIS — I10 Essential (primary) hypertension: Secondary | ICD-10-CM | POA: Diagnosis not present

## 2022-02-13 DIAGNOSIS — Z79899 Other long term (current) drug therapy: Secondary | ICD-10-CM | POA: Insufficient documentation

## 2022-02-13 DIAGNOSIS — M25552 Pain in left hip: Secondary | ICD-10-CM | POA: Insufficient documentation

## 2022-02-13 LAB — URINALYSIS, ROUTINE W REFLEX MICROSCOPIC
Bilirubin Urine: NEGATIVE
Glucose, UA: NEGATIVE mg/dL
Hgb urine dipstick: NEGATIVE
Ketones, ur: NEGATIVE mg/dL
Leukocytes,Ua: NEGATIVE
Nitrite: NEGATIVE
Protein, ur: NEGATIVE mg/dL
Specific Gravity, Urine: 1.025 (ref 1.005–1.030)
pH: 7 (ref 5.0–8.0)

## 2022-02-13 NOTE — ED Provider Notes (Signed)
[facility] EMERGENCY DEPARTMENT Provider Note   CSN: [id number] Arrival date & time: 02/13/22  7893     History  Chief Complaint  Patient presents with   Hip Pain    Bryan Craig is a 71 y.o. male.  Patient here with left hip pain.  Radiates out into the groin.  Pain is only worse when he walks.  When he still he does not have pain.  He did do some manual activity recently and not sure if that causes discomfort.  He has a history of kidney stones but does not feel similar.  Does not have any blood in his urine.  Does not have any active pain now.  Denies any testicular pain or pain with urination.  Has not noticed any hernias.  Has a history of hypertension high cholesterol.  Denies any back pain.  Denies any numbness, weakness, loss of bowel or bladder.        Home Medications Prior to Admission medications   Medication Sig Start Date End Date Taking? Authorizing Provider  atorvastatin (LIPITOR) 20 MG tablet Take 1 tablet (20 mg total) by mouth daily. 11/25/21   Saguier, Ramon Dredge, PA-C  Difluprednate 0.05 % EMUL Administer 1 drop into the left eye daily. 08/06/15   [provider]  famciclovir (FAMVIR) 500 MG tablet Take 1 tablet (500 mg total) by mouth 3 (three) times daily. 06/22/18   Saguier, Ramon Dredge, PA-C  HYDROmorphone (DILAUDID) 4 MG tablet Take 0.5-1 tablets (2-4 mg total) by mouth every 4 (four) hours as needed for severe pain. 04/06/14   Molpus, John, MD  methylPREDNISolone (MEDROL) 4 MG tablet Standard 6 day taper dose 11/11/21   Saguier, Ramon Dredge, PA-C  metoprolol succinate (TOPROL-XL) 50 MG 24 hr tablet Take 1 tablet (50 mg total) by mouth daily. 11/11/21   Saguier, Ramon Dredge, PA-C  ondansetron (ZOFRAN ODT) 4 MG disintegrating tablet Take 1 tablet (4 mg total) by mouth every 8 (eight) hours as needed for nausea or vomiting. 03/03/21   Hyman Hopes, Margaux, PA-C  ondansetron (ZOFRAN ODT) 8 MG disintegrating tablet Take 1 tablet (8 mg total) by mouth every 8 (eight)  hours as needed for nausea or vomiting. 04/06/14   Molpus, John, MD  oxyCODONE-acetaminophen (PERCOCET/ROXICET) 5-325 MG tablet Take 1 tablet by mouth every 6 (six) hours as needed for severe pain. 03/03/21   Tanda Rockers, PA-C  tamsulosin (FLOMAX) 0.4 MG CAPS capsule Take 1 capsule daily until stone passes. 04/06/14   Molpus, John, MD  telmisartan (MICARDIS) 80 MG tablet Take 1 tablet (80 mg total) by mouth daily. 11/11/21   Saguier, Ramon Dredge, PA-C  triamcinolone cream (KENALOG) 0.1 % Apply 1 Application topically 2 (two) times daily. 11/11/21   Saguier, Ramon Dredge, PA-C      Allergies    Lisinopril and Penicillins    Review of Systems   Review of Systems  Physical Exam Updated Vital Signs BP 134/79   Pulse 67   Temp 97.8 F (36.6 C) (Oral)   Resp 14   SpO2 98%  Physical Exam Vitals and nursing note reviewed. Exam conducted with a chaperone present.  Constitutional:      General: He is not in acute distress.    Appearance: He is well-developed.  HENT:     Head: Normocephalic and atraumatic.  Eyes:     Extraocular Movements: Extraocular movements intact.     Conjunctiva/sclera: Conjunctivae normal.     Pupils: Pupils are equal, round, and reactive to light.  Cardiovascular:  Rate and Rhythm: Normal rate and regular rhythm.     Heart sounds: No murmur heard. Pulmonary:     Effort: Pulmonary effort is normal. No respiratory distress.     Breath sounds: Normal breath sounds.  Abdominal:     General: Abdomen is flat. There is no distension.     Palpations: Abdomen is soft.     Tenderness: There is no abdominal tenderness.  Genitourinary:    Penis: Normal.      Testes: Normal.  Musculoskeletal:        General: Tenderness present. No swelling. Normal range of motion.     Cervical back: Neck supple.     Comments: Tender in the left hip flexor area, no obvious hernia  Skin:    General: Skin is warm and dry.     Capillary Refill: Capillary refill takes less than 2 seconds.   Neurological:     General: No focal deficit present.     Mental Status: He is alert.     Sensory: No sensory deficit.     Motor: No weakness.     Comments: 5+ out of 5 strength throughout, normal sensation, no drift, normal finger-nose-finger, normal speech  Psychiatric:        Mood and Affect: Mood normal.     ED Results / Procedures / Treatments   Labs (all labs ordered are listed, but only abnormal results are displayed) Labs Reviewed  URINALYSIS, ROUTINE W REFLEX MICROSCOPIC    EKG None  Radiology DG Hip Unilat With Pelvis 2-3 Views Left  Result Date: 02/13/2022 CLINICAL DATA:  Left-sided pain that radiates to EXAM: DG HIP (WITH PELVIS) 2V LEFT COMPARISON:  CT AP 03/03/21 FINDINGS: There is no evidence of hip fracture or dislocation. There is no evidence of arthropathy or other focal bone abnormality. Unchanged sclerotic lesion of the left femoral neck with an apparent central lucency is unchanged compared to 03/03/2021. This could represent a synovial herniation pit. There are pelvic phleboliths. Moderate colonic stool burden.) IMPRESSION: No acute displaced fracture or dislocation. Electronically Signed   By: Marin Roberts M.D.   On: 02/13/2022 10:54    Procedures Procedures    Medications Ordered in ED Medications - No data to display  ED Course/ Medical Decision Making/ A&P                           Medical Decision Making Amount and/or Complexity of Data Reviewed Labs: ordered. Radiology: ordered.   [name] is here with left hip pain.  History of hypertension, high cholesterol, kidney stone.  Pain mostly in the left hip flexor area.  He only has pain when he ambulates.  No obvious hernia on exam.  His GU exam is unremarkable.  Testicular exam is unremarkable.  He has no abdominal tenderness.  He is very well-appearing.  He has normal vitals.  Does not have any pain with urination or kidney stone type pain.  X-ray was obtained that showed no acute fracture or  malalignment per my review and interpretation of x-ray.  Urinalysis negative for infection.  No hematuria.  Differential diagnosis likely that this is a muscular process.  May be a hip flexor strain.  I have no concern for kidney stone or diverticulitis or testicular process at this time.  No concern for vascular process including DVT.  He is neurovascular neuromuscular intact on exam.  He has no back pain.  Overall recommend conservative management with Tylenol and  rest.  Recommend follow-up with primary care doctor.  Discharged in good condition.  This chart was dictated using voice recognition software.  Despite best efforts to proofread,  errors can occur which can change the documentation meaning.         Final Clinical Impression(s) / ED Diagnoses Final diagnoses:  Left hip pain    Rx / DC Orders ED Discharge Orders     None         Virgina Norfolk, DO 02/13/22 1115

## 2022-02-13 NOTE — ED Triage Notes (Signed)
Left side pain that radiates to groin. Hurts more when he stand.states that las bm yesterday morning. States that he is constipated

## 2022-02-17 ENCOUNTER — Ambulatory Visit (INDEPENDENT_AMBULATORY_CARE_PROVIDER_SITE_OTHER): Payer: Medicare Other | Admitting: Medical

## 2022-02-17 VITALS — BP 120/76 | HR 78 | Temp 98.0°F | Resp 18 | Ht 68.0 in | Wt 184.0 lb

## 2022-02-17 DIAGNOSIS — L409 Psoriasis, unspecified: Secondary | ICD-10-CM | POA: Diagnosis not present

## 2022-02-17 DIAGNOSIS — R739 Hyperglycemia, unspecified: Secondary | ICD-10-CM

## 2022-02-17 DIAGNOSIS — I10 Essential (primary) hypertension: Secondary | ICD-10-CM

## 2022-02-17 DIAGNOSIS — E785 Hyperlipidemia, unspecified: Secondary | ICD-10-CM | POA: Diagnosis not present

## 2022-02-17 DIAGNOSIS — M25552 Pain in left hip: Secondary | ICD-10-CM

## 2022-02-17 LAB — HEMOGLOBIN A1C: Hgb A1c MFr Bld: 5.9 % (ref 4.6–6.5)

## 2022-02-17 NOTE — Patient Instructions (Addendum)
Resolved left hip pain. If pain returns let me know. In that event refer to sport med MD.  Psoriasis- much improved on skyrizi.  Htn- controlled continue current med. Telmisartan 80 mg daily and toprol xl 50 mg daily.  Hyperlipidemia- mild elevated in august. Continue current statin and concentrate on better diet.  Elevated sugar on review- get a1c today. Eat low sugar diet.  Follow up in 3 months or sooner if needed

## 2022-02-17 NOTE — Progress Notes (Signed)
Subjective:    Patient ID: Bryan Craig, male    DOB: 1950-11-26, 71 y.o.   MRN: [id number]  HPI  Pt in for follow up. 4 days ago went to ED. He had work up for hip pain. He states by time he got home and rested on bed for one hour. Left hip pain went away.    Below in " the A/P on review.  "Codie Krogh is here with left hip pain.  History of hypertension, high cholesterol, kidney stone.  Pain mostly in the left hip flexor area.  He only has pain when he ambulates.  No obvious hernia on exam.  His GU exam is unremarkable.  Testicular exam is unremarkable.  He has no abdominal tenderness.  He is very well-appearing.  He has normal vitals.  Does not have any pain with urination or kidney stone type pain.  X-ray was obtained that showed no acute fracture or malalignment per my review and interpretation of x-ray.  Urinalysis negative for infection.  No hematuria.  Differential diagnosis likely that this is a muscular process.  May be a hip flexor strain.  I have no concern for kidney stone or diverticulitis or testicular process at this time.  No concern for vascular process including DVT.  He is neurovascular neuromuscular intact on exam.  He has no back pain.  Overall recommend conservative management with Tylenol and rest.  Recommend follow-up with primary care doctor.  Discharged in good condition."  Pt xray hip showed no fracture or dislocation.   Pt has psoriasis. Pt has seen dermatologist. Pt has started skyrizi. Is helping a lot. He shows me pictures. Significant improvement.   Htn- bp controlled with telmisartan 80 mg daily and toprol xl 50 mg daily.  Hyperlipidemia- on atorvastin 20 mg daily.  Pt has mild elevated sugar.   Vaccines declined by pt. States does not want to feel bad with upcoming trip.   Review of Systems  Constitutional:  Negative for chills, fatigue and fever.  HENT:  Negative for congestion, drooling and ear discharge.   Respiratory:  Negative for cough,  chest tightness, shortness of breath and wheezing.   Cardiovascular:  Negative for chest pain and palpitations.  Gastrointestinal:  Negative for abdominal pain, diarrhea and nausea.  Genitourinary:  Negative for difficulty urinating.  Musculoskeletal:  Negative for back pain.  Skin:  Negative for rash.    Past Medical History:  Diagnosis Date   Hyperlipidemia    Hypertension    Kidney stone    Retinal detachment      Social History   Socioeconomic History   Marital status: Married    Spouse name: Not on file   Number of children: Not on file   Years of education: Not on file   Highest education level: Not on file  Occupational History   Not on file  Tobacco Use   Smoking status: Never   Smokeless tobacco: Never  Substance and Sexual Activity   Alcohol use: Yes    Comment: rare occasional social drinker.   Drug use: Never   Sexual activity: Yes  Other Topics Concern   Not on file  Social History Narrative   Not on file   Social Determinants of Health   Financial Resource Strain: Not on file  Food Insecurity: Not on file  Transportation Needs: Not on file  Physical Activity: Not on file  Stress: Not on file  Social Connections: Not on file  Intimate Partner Violence: Not on file  Past Surgical History:  Procedure Laterality Date   EYE SURGERY      No family history on file.  Allergies  Allergen Reactions   Lisinopril     REACTION: cough   Penicillins     Current Outpatient Medications on File Prior to Visit  Medication Sig Dispense Refill   atorvastatin (LIPITOR) 20 MG tablet Take 1 tablet (20 mg total) by mouth daily. 90 tablet 3   Difluprednate 0.05 % EMUL Administer 1 drop into the left eye daily.     famciclovir (FAMVIR) 500 MG tablet Take 1 tablet (500 mg total) by mouth 3 (three) times daily. 21 tablet 0   HYDROmorphone (DILAUDID) 4 MG tablet Take 0.5-1 tablets (2-4 mg total) by mouth every 4 (four) hours as needed for severe pain. 30 tablet  0   methylPREDNISolone (MEDROL) 4 MG tablet Standard 6 day taper dose 21 tablet 0   metoprolol succinate (TOPROL-XL) 50 MG 24 hr tablet Take 1 tablet (50 mg total) by mouth daily. 90 tablet 3   ondansetron (ZOFRAN ODT) 4 MG disintegrating tablet Take 1 tablet (4 mg total) by mouth every 8 (eight) hours as needed for nausea or vomiting. 20 tablet 0   ondansetron (ZOFRAN ODT) 8 MG disintegrating tablet Take 1 tablet (8 mg total) by mouth every 8 (eight) hours as needed for nausea or vomiting. 20 tablet 0   oxyCODONE-acetaminophen (PERCOCET/ROXICET) 5-325 MG tablet Take 1 tablet by mouth every 6 (six) hours as needed for severe pain. 15 tablet 0   tamsulosin (FLOMAX) 0.4 MG CAPS capsule Take 1 capsule daily until stone passes. 30 capsule 0   telmisartan (MICARDIS) 80 MG tablet Take 1 tablet (80 mg total) by mouth daily. 90 tablet 3   triamcinolone cream (KENALOG) 0.1 % Apply 1 Application topically 2 (two) times daily. 30 g 0   No current facility-administered medications on file prior to visit.    BP 120/76   Pulse 78   Temp 98 F (36.7 C)   Resp 18   Ht 5\' 8"  (1.727 m)   Wt 184 lb (83.5 kg)   SpO2 99%   BMI 27.98 kg/m        Objective:   Physical Exam  General Mental Status- Alert. General Appearance- Not in acute distress.   Skin Cleared up psoriasis on back.  Neck Carotid Arteries- Normal color. Moisture- Normal Moisture. No carotid bruits. No JVD.  Chest and Lung Exam Auscultation: Breath Sounds:-Normal.  Cardiovascular Auscultation:Rythm- Regular. Murmurs & Other Heart Sounds:Auscultation of the heart reveals- No Murmurs.  Abdomen Inspection:-Inspeection Normal. Palpation/Percussion:Note:No mass. Palpation and Percussion of the abdomen reveal- Non Tender, Non Distended + BS, no rebound or guarding.   Neurologic Cranial Nerve exam:- CN III-XII intact(No nystagmus), symmetric smile. Strength:- 5/5 equal and symmetric strength both upper and lower extremities.        Assessment & Plan:   Patient Instructions  Resolved left hip pain. If pain returns let me know. In that event refer to sport med MD.  Psoriasis- much improved on skyrizi.  Htn- controlled continue current med. Telmisartan 80 mg daily and toprol xl 50 mg daily.  Hyperlipidemia- mild elevated in august. Continue current statin and concentrate on better diet.  Elevated sugar on review- get a1c today. Eat low sugar diet.  Follow up in 3 months or sooner if needed    [employer], PA-C

## 2022-03-10 IMAGING — CT CT ABD-PELV W/ CM
2 of 5 series · 15 of 46 positions shown, 17 images · IV contrast (Omnipaque)
Comparison: Prior CT scan of the abdomen 04/27/2016 and 04/06/2014

CLINICAL DATA: Right lower quadrant abdominal/flank pain. Evaluate
for kidney stone or appendicitis

EXAM:
CT ABDOMEN AND PELVIS WITH CONTRAST
TECHNIQUE: Multidetector CT imaging of the abdomen and pelvis was performed
using the standard protocol following bolus administration of
intravenous contrast.
CONTRAST:  100mL OMNIPAQUE IOHEXOL 300 MG/ML  SOLN

[Series 2: axial st · axial · 0.84mm/px · z∈[-504,+6]mm · 12 of 116 slices shown, 14 images]
[im 7/116  soft-tissue]
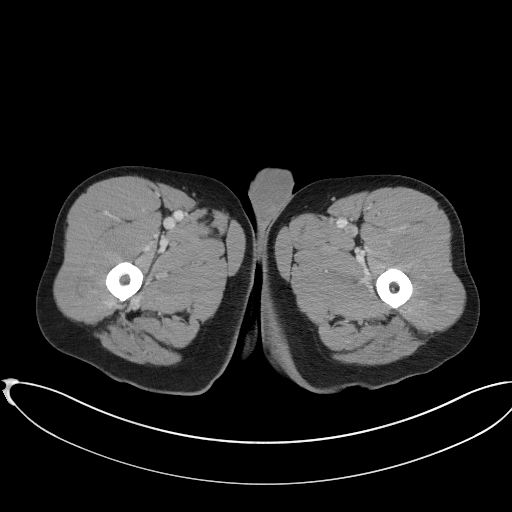
[im 7/116  bone]
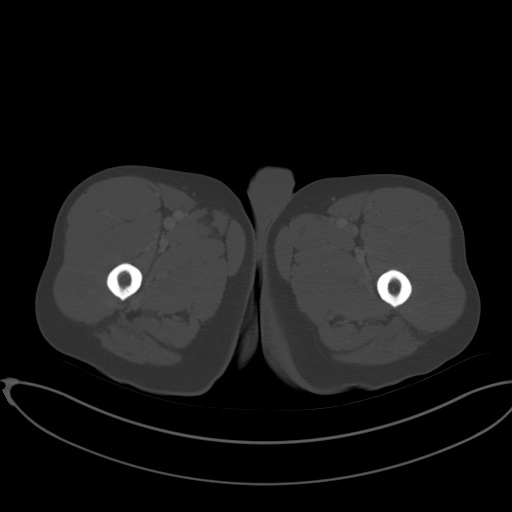
[im 19/116  soft-tissue]
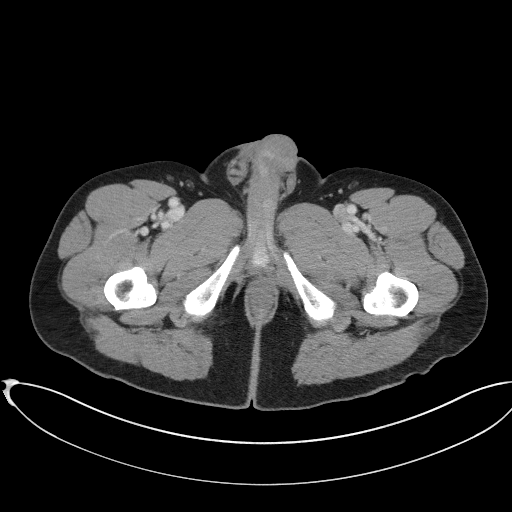
[im 25/116  soft-tissue]
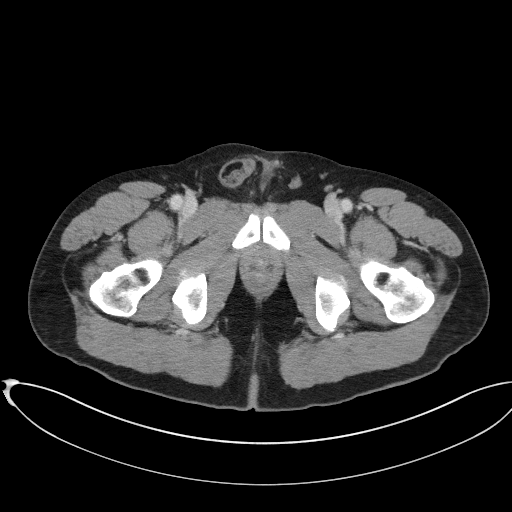
[im 37/116  soft-tissue]
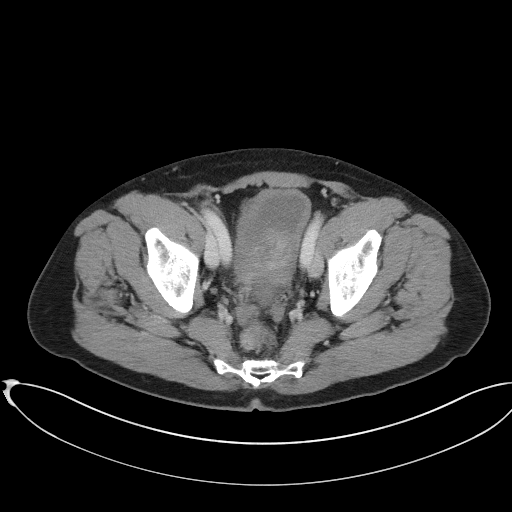
[im 43/116  soft-tissue]
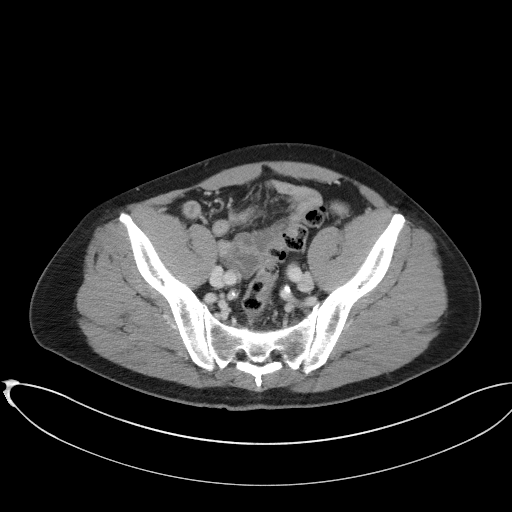
[im 55/116  soft-tissue]
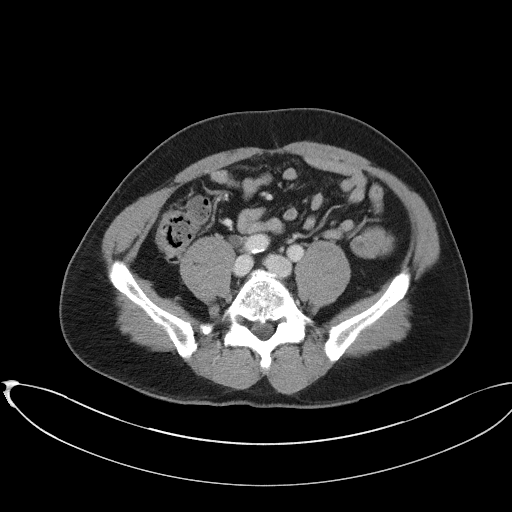
[im 61/116  soft-tissue]
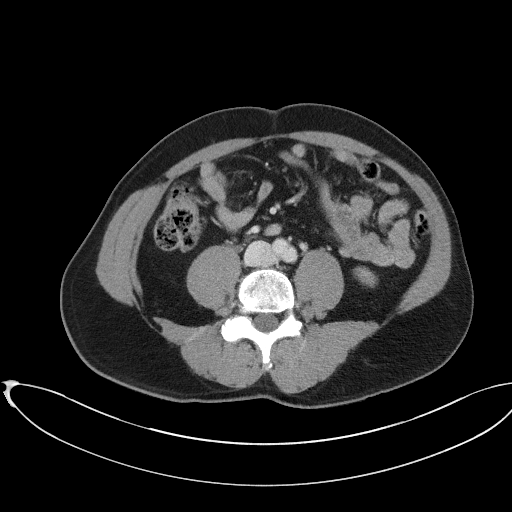
[im 73/116  soft-tissue]
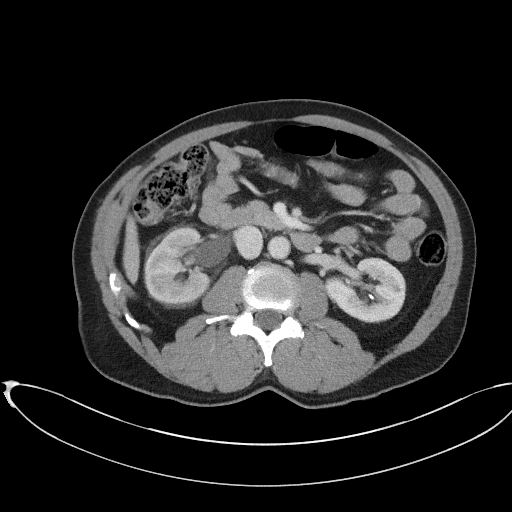
[im 79/116  soft-tissue]
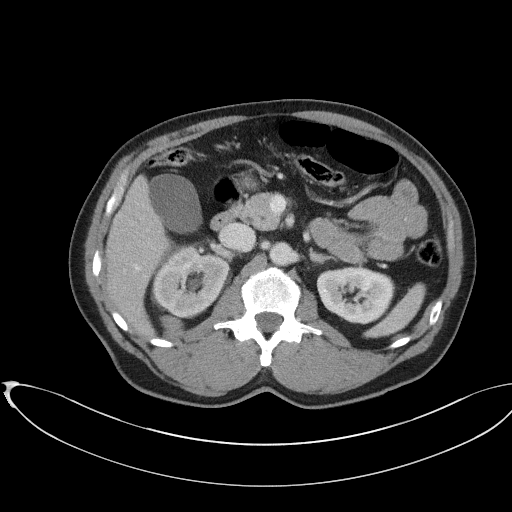
[im 79/116  bone]
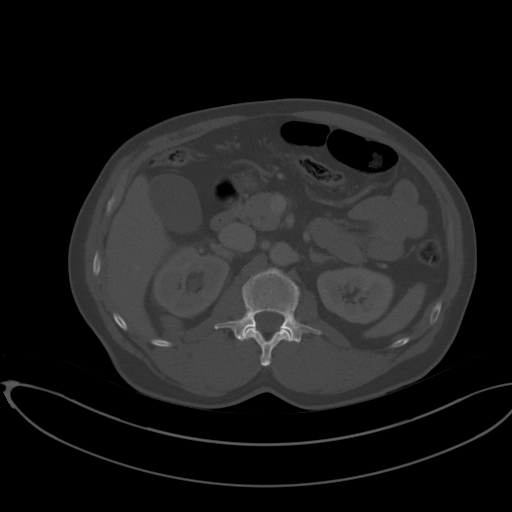
[im 91/116  soft-tissue]
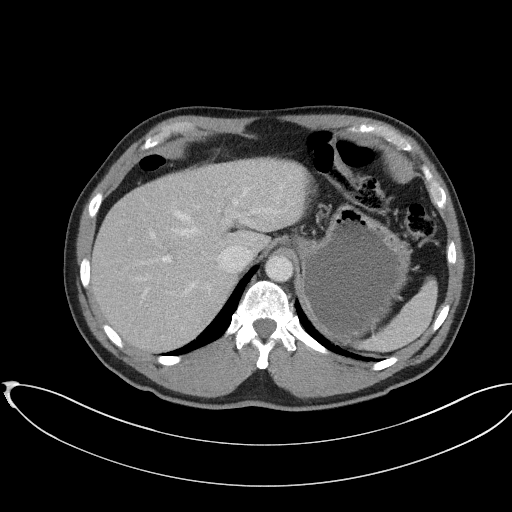
[im 97/116  soft-tissue]
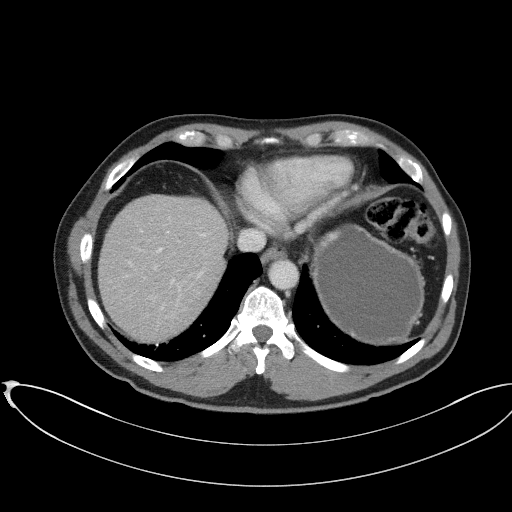
[im 109/116  soft-tissue]
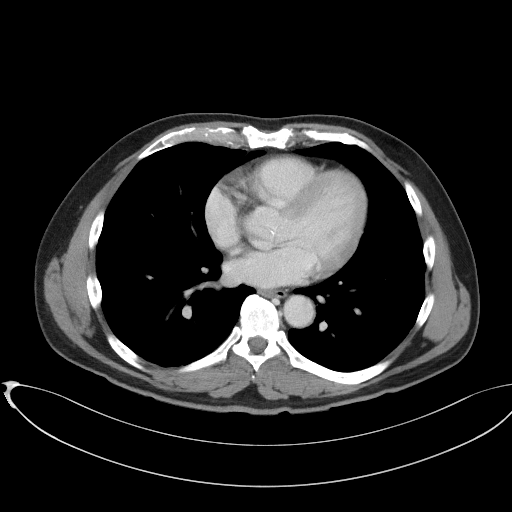

[Series 5: coronal st · coronal · 0.84mm/px · 3 of 90 slices shown]
[im 30/90  soft-tissue]
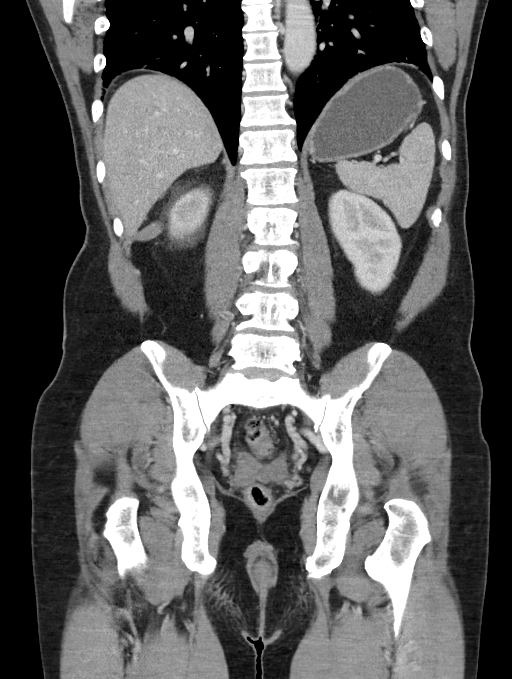
[im 40/90  soft-tissue]
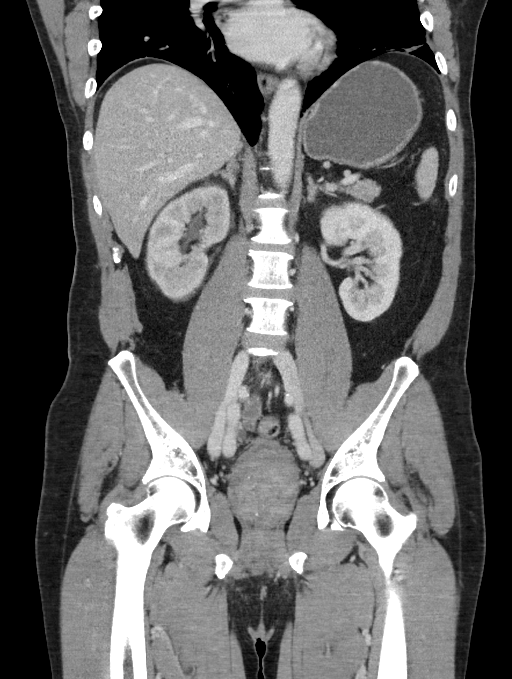
[im 50/90  soft-tissue]
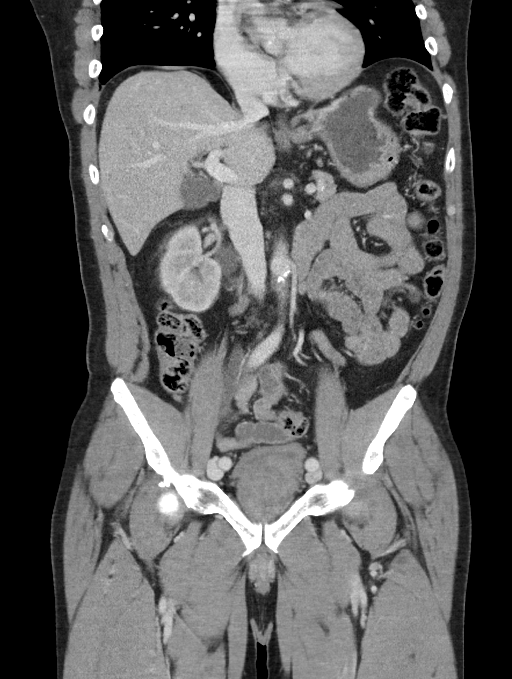

[15 of 46 positions shown; findings below may reference images not displayed]

FINDINGS: Lower chest: Small 2 mm right middle lobe pulmonary nodule (image 9
series 4) is stable dating back to 8620 and therefore benign.
Atherosclerotic plaques present along the coronary arteries. The
heart is normal in size. Normal distal thoracic esophagus.

Hepatobiliary: No focal liver abnormality is seen. No gallstones,
gallbladder wall thickening, or biliary dilatation.

Pancreas: Unremarkable. No pancreatic ductal dilatation or
surrounding inflammatory changes.

Spleen: Normal in size without focal abnormality.

Adrenals/Urinary Tract: Normal adrenal glands. Moderate right
hydronephrosis, renal edema and perinephric stranding. The ureter
remains dilated to the UVJ where there is a 4 mm obstructing stone.
Additional nonobstructing nephrolithiasis present on the left
including a 6 mm stone in the interpolar collecting system and a 3
mm stone in the lower pole collecting system. Additional punctate
nonobstructing stones present in the right upper and interpolar
collecting system. The bladder is decompressed.

Stomach/Bowel: Stomach is within normal limits. Appendix appears
normal. No evidence of bowel wall thickening, distention, or
inflammatory changes.

Vascular/Lymphatic: Mild atherosclerotic calcifications along the
infrarenal abdominal aorta. No evidence of aneurysm. Retroaortic
left renal vein. No suspicious lymphadenopathy.

Reproductive: Massive prostatomegaly. The prostate gland measures 7
x 6 5.4 cm (volume = 100 cm^3).

Other: No abdominal wall hernia or abnormality. No abdominopelvic
ascites.

Musculoskeletal: No acute or significant osseous findings.
IMPRESSION: 1. Obstructing 4 mm stone at the right UVJ resulting in moderate
right hydroureteronephrosis, renal edema and perinephric stranding.
2. Additional nonobstructing nephrolithiasis bilaterally with larger
stones on the left than the right.
3. Normal appearance of the appendix.
4. Aortic and coronary artery atherosclerotic calcifications.
5. Marked prostatomegaly. The prostate gland is approximately 100 g
in size.

Aortic Atherosclerosis (IO40U-7E2.2).

## 2022-05-20 ENCOUNTER — Ambulatory Visit: Payer: Medicare Other | Admitting: Medical

## 2022-07-02 DIAGNOSIS — H2513 Age-related nuclear cataract, bilateral: Secondary | ICD-10-CM | POA: Diagnosis not present

## 2022-07-16 ENCOUNTER — Telehealth: Payer: Self-pay | Admitting: Medical

## 2022-07-16 NOTE — Telephone Encounter (Signed)
Copied from [place] [id number]. Topic: Medicare AWV >> Jul 16, 2022 10:02 AM Devoria Glassing wrote: Reason for CRM: Called patient to schedule Medicare Annual Wellness Visit (AWV). Left message for patient to call back and schedule Medicare Annual Wellness Visit (AWV).  Last date of AWV: NONE  Please schedule an appointment at any time with Beatris Ship, CMA after 07/27/2022 on AWV schedule  .  If any questions, please contact me.  Thank you ,  Sherol Dade; [place] Direct Dial: [phone]

## 2022-07-21 ENCOUNTER — Telehealth: Payer: Self-pay

## 2022-07-21 ENCOUNTER — Encounter (HOSPITAL_BASED_OUTPATIENT_CLINIC_OR_DEPARTMENT_OTHER): Payer: Self-pay | Admitting: Emergency Medicine

## 2022-07-21 ENCOUNTER — Inpatient Hospital Stay (HOSPITAL_BASED_OUTPATIENT_CLINIC_OR_DEPARTMENT_OTHER)
Admission: EM | Admit: 2022-07-21 | Discharge: 2022-07-23 | DRG: 322 | Disposition: A | Discharge status: Home / Self Care | Payer: Medicare Other | Source: Ambulatory Visit | Attending: Student | Admitting: Student

## 2022-07-21 ENCOUNTER — Emergency Department (HOSPITAL_BASED_OUTPATIENT_CLINIC_OR_DEPARTMENT_OTHER): Payer: Medicare Other

## 2022-07-21 ENCOUNTER — Encounter: Payer: Self-pay | Admitting: Family Medicine

## 2022-07-21 ENCOUNTER — Other Ambulatory Visit: Payer: Self-pay

## 2022-07-21 ENCOUNTER — Ambulatory Visit (INDEPENDENT_AMBULATORY_CARE_PROVIDER_SITE_OTHER): Payer: Medicare Other | Admitting: Family Medicine

## 2022-07-21 ENCOUNTER — Telehealth: Payer: Self-pay | Admitting: Medical

## 2022-07-21 VITALS — BP 142/100 | HR 66 | Temp 97.4°F | Resp 16 | Wt 188.0 lb

## 2022-07-21 DIAGNOSIS — I214 Non-ST elevation (NSTEMI) myocardial infarction: Secondary | ICD-10-CM | POA: Diagnosis not present

## 2022-07-21 DIAGNOSIS — Z79899 Other long term (current) drug therapy: Secondary | ICD-10-CM | POA: Diagnosis not present

## 2022-07-21 DIAGNOSIS — I25110 Atherosclerotic heart disease of native coronary artery with unstable angina pectoris: Secondary | ICD-10-CM | POA: Diagnosis present

## 2022-07-21 DIAGNOSIS — I959 Hypotension, unspecified: Secondary | ICD-10-CM | POA: Diagnosis not present

## 2022-07-21 DIAGNOSIS — Z7982 Long term (current) use of aspirin: Secondary | ICD-10-CM

## 2022-07-21 DIAGNOSIS — Z87442 Personal history of urinary calculi: Secondary | ICD-10-CM

## 2022-07-21 DIAGNOSIS — R0602 Shortness of breath: Secondary | ICD-10-CM | POA: Diagnosis not present

## 2022-07-21 DIAGNOSIS — I10 Essential (primary) hypertension: Secondary | ICD-10-CM

## 2022-07-21 DIAGNOSIS — L409 Psoriasis, unspecified: Secondary | ICD-10-CM | POA: Diagnosis present

## 2022-07-21 DIAGNOSIS — I700 Atherosclerosis of aorta: Secondary | ICD-10-CM | POA: Diagnosis not present

## 2022-07-21 DIAGNOSIS — I77811 Abdominal aortic ectasia: Secondary | ICD-10-CM | POA: Diagnosis present

## 2022-07-21 DIAGNOSIS — I2510 Atherosclerotic heart disease of native coronary artery without angina pectoris: Secondary | ICD-10-CM | POA: Diagnosis not present

## 2022-07-21 DIAGNOSIS — Z8249 Family history of ischemic heart disease and other diseases of the circulatory system: Secondary | ICD-10-CM

## 2022-07-21 DIAGNOSIS — N400 Enlarged prostate without lower urinary tract symptoms: Secondary | ICD-10-CM | POA: Diagnosis present

## 2022-07-21 DIAGNOSIS — R079 Chest pain, unspecified: Secondary | ICD-10-CM | POA: Diagnosis not present

## 2022-07-21 DIAGNOSIS — R918 Other nonspecific abnormal finding of lung field: Secondary | ICD-10-CM | POA: Diagnosis present

## 2022-07-21 DIAGNOSIS — E785 Hyperlipidemia, unspecified: Secondary | ICD-10-CM | POA: Diagnosis present

## 2022-07-21 LAB — CBC WITH DIFFERENTIAL/PLATELET
Basophils Absolute: 0 10*3/uL (ref 0.0–0.1)
Basophils Relative: 0.7 % (ref 0.0–3.0)
Eosinophils Absolute: 0.1 10*3/uL (ref 0.0–0.7)
Eosinophils Relative: 1.7 % (ref 0.0–5.0)
HCT: 43 % (ref 39.0–52.0)
Hemoglobin: 15 g/dL (ref 13.0–17.0)
Lymphocytes Relative: 27.5 % (ref 12.0–46.0)
Lymphs Abs: 1.6 10*3/uL (ref 0.7–4.0)
MCHC: 34.9 g/dL (ref 30.0–36.0)
MCV: 89.6 fl (ref 78.0–100.0)
Monocytes Absolute: 0.5 10*3/uL (ref 0.1–1.0)
Monocytes Relative: 8.5 % (ref 3.0–12.0)
Neutro Abs: 3.5 10*3/uL (ref 1.4–7.7)
Neutrophils Relative %: 61.6 % (ref 43.0–77.0)
Platelets: 263 10*3/uL (ref 150.0–400.0)
RBC: 4.8 Mil/uL (ref 4.22–5.81)
RDW: 13.9 % (ref 11.5–15.5)
WBC: 5.7 10*3/uL (ref 4.0–10.5)

## 2022-07-21 LAB — COMPREHENSIVE METABOLIC PANEL
ALT: 19 U/L (ref 0–53)
AST: 19 U/L (ref 0–37)
Albumin: 4.5 g/dL (ref 3.5–5.2)
Alkaline Phosphatase: 75 U/L (ref 39–117)
BUN: 9 mg/dL (ref 6–23)
CO2: 29 mEq/L (ref 19–32)
Calcium: 9.7 mg/dL (ref 8.4–10.5)
Chloride: 102 mEq/L (ref 96–112)
Creatinine, Ser: 0.97 mg/dL (ref 0.40–1.50)
GFR: 77.77 mL/min (ref >60.00)
Glucose, Bld: 106 mg/dL — ABNORMAL HIGH (ref 70–99)
Potassium: 4.4 mEq/L (ref 3.5–5.1)
Sodium: 137 mEq/L (ref 135–145)
Total Bilirubin: 0.5 mg/dL (ref 0.2–1.2)
Total Protein: 7.3 g/dL (ref 6.0–8.3)

## 2022-07-21 LAB — D-DIMER, QUANTITATIVE
D-Dimer, Quant: 0.38 mcg/mL FEU (ref <0.50)
D-Dimer, Quant: 0.29 ug/mL-FEU (ref 0.00–0.50)

## 2022-07-21 LAB — LIPID PANEL
Cholesterol: 225 mg/dL — ABNORMAL HIGH (ref 0–200)
HDL: 41.2 mg/dL (ref >39.00)
NonHDL: 183.53
Total CHOL/HDL Ratio: 5
Triglycerides: 205 mg/dL — ABNORMAL HIGH (ref 0.0–149.0)
VLDL: 41 mg/dL — ABNORMAL HIGH (ref 0.0–40.0)

## 2022-07-21 LAB — LDL CHOLESTEROL, DIRECT: Direct LDL: 131 mg/dL

## 2022-07-21 LAB — TROPONIN I (HIGH SENSITIVITY)
High Sens Troponin I: 56 ng/L (ref 2–17)
Troponin I (High Sensitivity): 159 ng/L (ref <18)
Troponin I (High Sensitivity): 102 ng/L (ref <18)

## 2022-07-21 LAB — TSH: TSH: 1.21 u[IU]/mL (ref 0.35–5.50)

## 2022-07-21 MED ORDER — HYDROCHLOROTHIAZIDE 25 MG PO TABS: 25.0000 mg | ORAL_TABLET | Freq: Every day | ORAL | 3 refills | Status: DC

## 2022-07-21 MED ORDER — ONDANSETRON HCL 4 MG/2ML IJ SOLN: 4.0000 mg | Freq: Four times a day (QID) | INTRAMUSCULAR | Status: DC | PRN

## 2022-07-21 MED ORDER — IRBESARTAN 150 MG PO TABS
300.0000 mg | ORAL_TABLET | Freq: Every day | ORAL | Status: DC
  Administered 2022-07-22 – 2022-07-23 (×2): 300 mg via ORAL
  Filled 2022-07-21 (×2): qty 2

## 2022-07-21 MED ORDER — SODIUM CHLORIDE 0.9% FLUSH: 3.0000 mL | INTRAVENOUS | Status: DC | PRN

## 2022-07-21 MED ORDER — IOHEXOL 350 MG/ML SOLN
100.0000 mL | Freq: Once | INTRAVENOUS | Status: AC | PRN
  Administered 2022-07-21: 100 mL via INTRAVENOUS

## 2022-07-21 MED ORDER — SODIUM CHLORIDE 0.9 % IV SOLN: 250.0000 mL | INTRAVENOUS | Status: DC | PRN

## 2022-07-21 MED ORDER — ATORVASTATIN CALCIUM 80 MG PO TABS
80.0000 mg | ORAL_TABLET | Freq: Every day | ORAL | Status: DC
  Administered 2022-07-21 – 2022-07-22 (×2): 80 mg via ORAL
  Filled 2022-07-21 (×2): qty 1

## 2022-07-21 MED ORDER — SODIUM CHLORIDE 0.9% FLUSH
3.0000 mL | Freq: Two times a day (BID) | INTRAVENOUS | Status: DC
  Administered 2022-07-21 – 2022-07-22 (×3): 3 mL via INTRAVENOUS

## 2022-07-21 MED ORDER — ASPIRIN 325 MG PO TABS
325.0000 mg | ORAL_TABLET | Freq: Every day | ORAL | Status: DC
  Administered 2022-07-21: 325 mg via ORAL
  Filled 2022-07-21: qty 1

## 2022-07-21 MED ORDER — METOPROLOL SUCCINATE ER 50 MG PO TB24
50.0000 mg | ORAL_TABLET | Freq: Every day | ORAL | Status: DC
  Administered 2022-07-22 – 2022-07-23 (×2): 50 mg via ORAL
  Filled 2022-07-21 (×2): qty 1

## 2022-07-21 MED ORDER — ACETAMINOPHEN 325 MG PO TABS: 650.0000 mg | ORAL_TABLET | ORAL | Status: DC | PRN

## 2022-07-21 MED ORDER — NITROGLYCERIN 0.4 MG SL SUBL
0.4000 mg | SUBLINGUAL_TABLET | SUBLINGUAL | Status: DC | PRN
  Administered 2022-07-21: 0.4 mg via SUBLINGUAL
  Filled 2022-07-21: qty 1

## 2022-07-21 MED ORDER — ASPIRIN 81 MG PO TBEC
81.0000 mg | DELAYED_RELEASE_TABLET | Freq: Every day | ORAL | Status: DC
  Administered 2022-07-22 – 2022-07-23 (×2): 81 mg via ORAL
  Filled 2022-07-21 (×2): qty 1

## 2022-07-21 MED ORDER — OXYCODONE HCL 5 MG PO TABS: 5.0000 mg | ORAL_TABLET | ORAL | Status: DC | PRN

## 2022-07-21 MED ORDER — MORPHINE SULFATE (PF) 2 MG/ML IV SOLN: 2.0000 mg | INTRAVENOUS | Status: DC | PRN

## 2022-07-21 MED ORDER — METOPROLOL TARTRATE 25 MG PO TABS
25.0000 mg | ORAL_TABLET | Freq: Once | ORAL | Status: AC
  Administered 2022-07-21: 25 mg via ORAL
  Filled 2022-07-21: qty 1

## 2022-07-21 MED ORDER — HEPARIN (PORCINE) 25000 UT/250ML-% IV SOLN
1100.0000 [IU]/h | INTRAVENOUS | Status: DC
  Administered 2022-07-21 – 2022-07-22 (×3): 1100 [IU]/h via INTRAVENOUS
  Filled 2022-07-21 (×2): qty 250

## 2022-07-21 MED ORDER — HEPARIN BOLUS VIA INFUSION
4000.0000 [IU] | Freq: Once | INTRAVENOUS | Status: AC
  Administered 2022-07-21: 4000 [IU] via INTRAVENOUS

## 2022-07-21 NOTE — ED Notes (Signed)
Pain resolved after first Nitrol SL 0.4mg . pt reports feeling sweaty and fatigued, VS reassessed

## 2022-07-21 NOTE — Telephone Encounter (Signed)
Spoke with the pt- Pt aware and voices understanding.

## 2022-07-21 NOTE — Telephone Encounter (Signed)
Noted  

## 2022-07-21 NOTE — Progress Notes (Signed)
ANTICOAGULATION CONSULT NOTE - Initial Consult  Pharmacy Consult for heparin Indication: chest pain/ACS  Allergies  Allergen Reactions   Lisinopril     REACTION: cough   Penicillins    Patient Measurements:   Heparin Dosing Weight: 85.3 kg  Vital Signs: Temp: 97.8 F (36.6 C) (03/26 1522) Temp Source: Oral (03/26 1522) BP: 164/93 (03/26 1522) Pulse Rate: 65 (03/26 1522)  Labs: Recent Labs    07/21/22 1215 07/21/22 1523  HGB 15.0  --   HCT 43.0  --   PLT 263.0  --   CREATININE 0.97  --   TROPONINIHS  --  159*   Estimated Creatinine Clearance: 73.2 mL/min (by C-G formula based on SCr of 0.97 mg/dL).  Medical History: Past Medical History:  Diagnosis Date   Hyperlipidemia    Hypertension    Kidney stone    Retinal detachment     Medications:  Infusions:   heparin     Assessment: AV is a 72 year old male admitted for chest pain and found to have elevated troponin at 159. No anticoagulation prior to admission. Pharmacy consulted to dose heparin for ACS. Hgb 15, plt 263.   Goal of Therapy:  Heparin level 0.3-0.7 units/ml Monitor platelets by anticoagulation protocol: Yes   Plan:  Give 4000 units bolus x 1 Start heparin infusion at 1100 units/hr Check anti-Xa level in 8 hours and daily while on heparin Continue to monitor H&H and platelets  Jeneen Rinks [id number] PM

## 2022-07-21 NOTE — ED Notes (Signed)
Report called to Amy, RN at receiving facility.

## 2022-07-21 NOTE — Progress Notes (Signed)
Subjective:   By signing my name below, I, Shehryar Baig, attest that this documentation has been prepared under the direction and in the presence of Ann Held, DO. 07/21/2022   Patient ID: Bryan Craig, male    DOB: June 18, 1950, 72 y.o.   MRN: [id number]  Chief Complaint  Patient presents with   Chest Pain    Complains of some chest pain with chest "pressure"    Chest Pain  Pertinent negatives include no cough or shortness of breath.   Patient is in today for a office visit.   He complains of an episode of chest discomfort for 15-20 minutes this morning. After is started he laid down until he calmed down and his symptoms improved. He thinks walking up and down the stairs too fast may have triggered his episode. He denies having any shortness of breathe or cough. He notes his heart was not racing during his episode this morning. He has a history of heart murmer.    Past Medical History:  Diagnosis Date   Hyperlipidemia    Hypertension    Kidney stone    Retinal detachment     Past Surgical History:  Procedure Laterality Date   EYE SURGERY      No family history on file.  Social History   Socioeconomic History   Marital status: Married    Spouse name: Not on file   Number of children: Not on file   Years of education: Not on file   Highest education level: Not on file  Occupational History   Not on file  Tobacco Use   Smoking status: Never   Smokeless tobacco: Never  Substance and Sexual Activity   Alcohol use: Yes    Comment: rare occasional social drinker.   Drug use: Never   Sexual activity: Yes  Other Topics Concern   Not on file  Social History Narrative   Not on file   Social Determinants of Health   Financial Resource Strain: Not on file  Food Insecurity: Not on file  Transportation Needs: Not on file  Physical Activity: Not on file  Stress: Not on file  Social Connections: Not on file  Intimate Partner Violence: Not on file     Outpatient Medications Prior to Visit  Medication Sig Dispense Refill   atorvastatin (LIPITOR) 20 MG tablet Take 1 tablet (20 mg total) by mouth daily. 90 tablet 3   Difluprednate 0.05 % EMUL Administer 1 drop into the left eye daily.     metoprolol succinate (TOPROL-XL) 50 MG 24 hr tablet Take 1 tablet (50 mg total) by mouth daily. 90 tablet 3   Risankizumab-rzaa (SKYRIZI) 150 MG/ML SOSY Inject into the skin once a week.     telmisartan (MICARDIS) 80 MG tablet Take 1 tablet (80 mg total) by mouth daily. 90 tablet 3   triamcinolone cream (KENALOG) 0.1 % Apply 1 Application topically 2 (two) times daily. 30 g 0   famciclovir (FAMVIR) 500 MG tablet Take 1 tablet (500 mg total) by mouth 3 (three) times daily. 21 tablet 0   HYDROmorphone (DILAUDID) 4 MG tablet Take 0.5-1 tablets (2-4 mg total) by mouth every 4 (four) hours as needed for severe pain. 30 tablet 0   methylPREDNISolone (MEDROL) 4 MG tablet Standard 6 day taper dose 21 tablet 0   ondansetron (ZOFRAN ODT) 4 MG disintegrating tablet Take 1 tablet (4 mg total) by mouth every 8 (eight) hours as needed for nausea or vomiting. 20 tablet 0  ondansetron (ZOFRAN ODT) 8 MG disintegrating tablet Take 1 tablet (8 mg total) by mouth every 8 (eight) hours as needed for nausea or vomiting. 20 tablet 0   oxyCODONE-acetaminophen (PERCOCET/ROXICET) 5-325 MG tablet Take 1 tablet by mouth every 6 (six) hours as needed for severe pain. 15 tablet 0   tamsulosin (FLOMAX) 0.4 MG CAPS capsule Take 1 capsule daily until stone passes. 30 capsule 0   No facility-administered medications prior to visit.    Allergies  Allergen Reactions   Lisinopril     REACTION: cough   Penicillins     Review of Systems  Respiratory:  Negative for cough and shortness of breath.   Cardiovascular:  Positive for chest pain.       Objective:    Physical Exam Constitutional:      General: He is not in acute distress.    Appearance: Normal appearance. He is not  ill-appearing.  HENT:     Head: Normocephalic and atraumatic.     Right Ear: External ear normal.     Left Ear: External ear normal.  Eyes:     Extraocular Movements: Extraocular movements intact.     Pupils: Pupils are equal, round, and reactive to light.  Cardiovascular:     Rate and Rhythm: Normal rate and regular rhythm.     Heart sounds: Normal heart sounds. No murmur heard.    No gallop.  Pulmonary:     Effort: Pulmonary effort is normal. No respiratory distress.     Breath sounds: Normal breath sounds. No wheezing or rales.  Musculoskeletal:     Comments: 5/5 strength in both upper and lower extremities  Skin:    General: Skin is warm and dry.  Neurological:     Mental Status: He is alert and oriented to person, place, and time.  Psychiatric:        Judgment: Judgment normal.     BP (!) 152/97 (BP Location: Left Arm, Patient Position: Sitting, Cuff Size: Small)   Pulse 66   Temp (!) 97.4 F (36.3 C) (Oral)   Resp 16   Wt 188 lb (85.3 kg)   SpO2 98%   BMI 28.59 kg/m  Wt Readings from Last 3 Encounters:  07/21/22 188 lb (85.3 kg)  02/17/22 184 lb (83.5 kg)  11/18/21 179 lb 12.8 oz (81.6 kg)       Assessment & Plan:  Chest pain, unspecified type -     EKG 12-Lead    I, Shehryar Baig, personally preformed the services described in this documentation.  All medical record entries made by the scribe were at my direction and in my presence.  I have reviewed the chart and discharge instructions (if applicable) and agree that the record reflects my personal performance and is accurate and complete. 07/21/2022   I,Shehryar Baig,acting as a scribe for Ann Held, DO.,have documented all relevant documentation on the behalf of Ann Held, DO,as directed by  Ann Held, DO while in the presence of Ann Held, DO.   Shehryar [employer]

## 2022-07-21 NOTE — ED Triage Notes (Signed)
Patient presents to ED via POV from home. Seen at PCP this morning. Called back due to elevated troponin. Reports chest pain began this morning. Denies nausea/vomiting.

## 2022-07-21 NOTE — H&P (Addendum)
History and Physical    Bryan Craig [id number] DOB: [birth year] DOA: 07/21/2022  PCP: Mackie Pai, PA-C   Patient coming from: Home   Chief Complaint: Chest pain   HPI: Bryan Craig is a pleasant 72 y.o. male with medical history significant for hypertension, hyperlipidemia, psoriasis, and nephrolithiasis who presents to the emergency department for evaluation of chest pain.  Patient reports that he woke in his usual state but developed intense chest pressure with radiation to both upper extremities after walking up a flight of stairs in his house.  Symptoms resolved after approximately 30 minutes of rest.  He had recurrent symptoms while trying to use his stationary bicycle.  He had been resolution of the chest pressure with rest.  He went to his PCP clinic for evaluation of this where EKG was reassuring but troponin was elevated to 59 and he was sent to the ED.   He exercises by cycling regularly but was only able to cycle for 10 minutes 2 days ago due to fatigue.  This was unusual for him but he was not experiencing any chest discomfort until today.  There has not been any nausea, shortness of breath, or diaphoresis, but he feels profoundly fatigued during the episodes.  [facility] ED Course: Upon arrival to the ED, patient is found to be afebrile and saturating well on room air with elevated blood pressure.  EKG demonstrates sinus rhythm and chest x-ray negative for acute cardiopulmonary disease.  CTA chest/abdomen/pelvis reveals focal ectasia of infrarenal abdominal aorta, lung nodules, and enlarged prostate.  Labs are most notable for troponin of 159 and then 102.  Cardiology (Dr. Margaretann Loveless) was consulted by the ED physician, patient was treated with aspirin and sublingual nitro x 1, started on IV heparin, and transferred [facility] for admission.  Systolic blood pressure dropped to 60 after 1 sublingual nitroglycerin, he was given a fluid bolus, and recovered.  Review of  Systems:  All other systems reviewed and apart from HPI, are negative.  Past Medical History:  Diagnosis Date   Hyperlipidemia    Hypertension    Kidney stone    Retinal detachment     Past Surgical History:  Procedure Laterality Date   EYE SURGERY      Social History:   reports that he has never smoked. He has never used smokeless tobacco. He reports current alcohol use. He reports that he does not use drugs.  Allergies  Allergen Reactions   Lisinopril     REACTION: cough   Penicillins     History reviewed. No pertinent family history.   Prior to Admission medications   Medication Sig Start Date End Date Taking? Authorizing Provider  atorvastatin (LIPITOR) 20 MG tablet Take 1 tablet (20 mg total) by mouth daily. 11/25/21   Saguier, Percell Miller, PA-C  Difluprednate 0.05 % EMUL Administer 1 drop into the left eye daily. 08/06/15   [provider]  hydrochlorothiazide (HYDRODIURIL) 25 MG tablet Take 1 tablet (25 mg total) by mouth daily. 07/21/22   Ann Held, DO  metoprolol succinate (TOPROL-XL) 50 MG 24 hr tablet Take 1 tablet (50 mg total) by mouth daily. 11/11/21   Saguier, Percell Miller, PA-C  Risankizumab-rzaa [facility]) 150 MG/ML SOSY Inject into the skin once a week.    [provider]  telmisartan (MICARDIS) 80 MG tablet Take 1 tablet (80 mg total) by mouth daily. 11/11/21   Saguier, Percell Miller, PA-C  triamcinolone cream (KENALOG) 0.1 % Apply 1 Application topically 2 (two) times daily.  11/11/21   Mackie Pai, PA-C    Physical Exam: Vitals:   07/21/22 1930 07/21/22 2056 07/21/22 2101 07/21/22 2137  BP: (!) 163/97  (!) 148/101 (!) 155/98  Pulse: 70  70 68  Resp: (!) 21  20   Temp:   98.3 F (36.8 C)   TempSrc:   Oral   SpO2: 99%  98%   Weight:  84.7 kg    Height:  5\' 7"  (1.702 m)      Constitutional: NAD, calm  Eyes: PERTLA, lids and conjunctivae normal ENMT: Mucous membranes are moist. Posterior pharynx clear of any exudate or lesions.   Neck:  supple, no masses  Respiratory: clear to auscultation bilaterally, no wheezing, no crackles. No accessory muscle use.  Cardiovascular: S1 & S2 heard, regular rate and rhythm. No extremity edema.   Abdomen: No distension, no tenderness, soft. Bowel sounds active.  Musculoskeletal: no clubbing / cyanosis. No joint deformity upper and lower extremities.   Skin: no significant rashes, lesions, ulcers. Warm, dry, well-perfused. Neurologic: CN 2-12 grossly intact. Moving all extremities. Alert and oriented.  Psychiatric: Pleasant. Cooperative.    Labs and Imaging on Admission: I have personally reviewed following labs and imaging studies  CBC: Recent Labs  Lab 07/21/22 1215  WBC 5.7  NEUTROABS 3.5  HGB 15.0  HCT 43.0  MCV 89.6  PLT [id number]   Basic Metabolic Panel: Recent Labs  Lab 07/21/22 1215  NA 137  K 4.4  CL 102  CO2 29  GLUCOSE 106*  BUN 9  CREATININE 0.97  CALCIUM 9.7   GFR: Estimated Creatinine Clearance: 71.6 mL/min (by C-G formula based on SCr of 0.97 mg/dL). Liver Function Tests: Recent Labs  Lab 07/21/22 1215  AST 19  ALT 19  ALKPHOS 75  BILITOT 0.5  PROT 7.3  ALBUMIN 4.5   No results for input(s): "LIPASE", "AMYLASE" in the last 168 hours. No results for input(s): "AMMONIA" in the last 168 hours. Coagulation Profile: No results for input(s): "INR", "PROTIME" in the last 168 hours. Cardiac Enzymes: No results for input(s): "CKTOTAL", "CKMB", "CKMBINDEX", "TROPONINI" in the last 168 hours. BNP (last 3 results) No results for input(s): "PROBNP" in the last 8760 hours. HbA1C: No results for input(s): "HGBA1C" in the last 72 hours. CBG: No results for input(s): "GLUCAP" in the last 168 hours. Lipid Profile: Recent Labs    07/21/22 1215  CHOL 225*  HDL 41.20  TRIG 205.0*  CHOLHDL 5  LDLDIRECT 131.0   Thyroid Function Tests: Recent Labs    07/21/22 1215  TSH 1.21   Anemia Panel: No results for input(s): "VITAMINB12", "FOLATE", "FERRITIN",  "TIBC", "IRON", "RETICCTPCT" in the last 72 hours. Urine analysis:    Component Value Date/Time   COLORURINE YELLOW 02/13/2022 [place] 02/13/2022 0948   LABSPEC 1.025 02/13/2022 0948   PHURINE 7.0 02/13/2022 0948   GLUCOSEU NEGATIVE 02/13/2022 0948   HGBUR NEGATIVE 02/13/2022 0948   BILIRUBINUR NEGATIVE 02/13/2022 0948   KETONESUR NEGATIVE 02/13/2022 0948   PROTEINUR NEGATIVE 02/13/2022 0948   UROBILINOGEN 0.2 04/06/2014 0410   NITRITE NEGATIVE 02/13/2022 0948   LEUKOCYTESUR NEGATIVE 02/13/2022 0948   Sepsis Labs: @LABRCNTIP (procalcitonin:4,lacticidven:4) )No results found for this or any previous visit (from the past 240 hour(s)).   Radiological Exams on Admission: CT Angio Chest/Abd/Pel for Dissection W and/or Wo Contrast  Result Date: 07/21/2022 CLINICAL DATA:  Acute aortic syndrome.  Shortness of breath. EXAM: CT ANGIOGRAPHY CHEST, ABDOMEN AND PELVIS TECHNIQUE: Non-contrast CT of the chest  was initially obtained. Multidetector CT imaging through the chest, abdomen and pelvis was performed using the standard protocol during bolus administration of intravenous contrast. Multiplanar reconstructed images and MIPs were obtained and reviewed to evaluate the vascular anatomy. RADIATION DOSE REDUCTION: This exam was performed according to the departmental dose-optimization program which includes automated exposure control, adjustment of the mA and/or kV according to patient size and/or use of iterative reconstruction technique. CONTRAST:  130mL OMNIPAQUE IOHEXOL 350 MG/ML SOLN COMPARISON:  Report outside study 04/27/2016. Abdomen pelvis CT 03/03/2021. Chest x-ray earlier 07/21/2022 FINDINGS: CTA CHEST FINDINGS Cardiovascular: Pulsation artifact along the ascending aorta. No mediastinal hematoma. There is mild atherosclerotic plaque identified along the thoracic aorta. No dissection or aneurysm formation. Diameter of the ascending aorta proximally at its origin has a diameter of  2.5 cm. Ascending aorta at the level of the right pulmonary artery 3.1 x 3.0 cm. Diameter of the aortic arch distally has a measurement of 2.7 cm. The descending thoracic aorta at the level of the right pulmonary artery measures 2.6 x 2.5 cm. The origin of the great vessels is preserved. Mild coronary artery calcifications are seen. The heart is nonenlarged. No pericardial effusion. Mediastinum/Nodes: No specific abnormal lymph node enlargement seen in the axillary region, hilum or mediastinum. No pericardial effusion. Normal caliber thoracic esophagus. Lungs/Pleura: No consolidation, pneumothorax or effusion. There is some basilar atelectasis. 3 mm nodule right lung anteriorly on series 8, image 40. Few other tiny nodules as well similar size. Musculoskeletal: Mild degenerative changes are seen along the spine. Sclerotic focus within the vertebral body at T3, possible bone island. Review of the MIP images confirms the above findings. CTA ABDOMEN AND PELVIS FINDINGS VASCULAR Aorta: Mild atherosclerotic changes along the abdominal aorta. There is a focal area of ectasia of the infrarenal abdominal aorta measuring 2.7 by 1.9 cm. This is at the level of the origin of the IMA. No dissection. Celiac: Patent without evidence of aneurysm, dissection, vasculitis or significant stenosis. SMA: Patent without evidence of aneurysm, dissection, vasculitis or significant stenosis. Renals: Both renal arteries are patent without evidence of aneurysm, dissection, vasculitis, fibromuscular dysplasia or significant stenosis. IMA: Patent without evidence of aneurysm, dissection, vasculitis or significant stenosis. Inflow: Ectatic right common iliac artery measuring up to 20 mm in diameter. The left is also ectatic but to a lesser extent measuring a diameter of 16 mm. Mild plaque. No significant stenosis. Veins: No obvious venous abnormality within the limitations of this arterial phase study. Retroaortic left renal vein. Review of the  MIP images confirms the above findings. NON-VASCULAR Hepatobiliary: With the limits of the early phase of the contrast bolus, the liver is grossly preserved. The gallbladder is nondilated. Pancreas: Unremarkable. No pancreatic ductal dilatation or surrounding inflammatory changes. Spleen: Normal in size without focal abnormality. Adrenals/Urinary Tract: The right adrenal gland is preserved. The left is slightly thickened and nodular. Unchanged from previous. No collecting system dilatation of either kidney. There are some nonobstructing renal stones as seen previously. Example on the left measures 6 mm and right 2 mm. The ureters have normal course and caliber down to the distended urinary bladder. Stomach/Bowel: There is a large right inguinal hernia involving mesenteric fat and loops of small bowel. No obstruction. Scattered colonic stool. Slightly redundant course to the sigmoid colon. Few colonic diverticula. There is also loops of transverse colon extending between the anterior liver margin in the anterior abdominal wall. Normal appendix. The stomach is moderately distended with debris and fluid. Again no bowel dilatation.  Lymphatic: No specific abnormal lymph node enlargement. Reproductive: Enlarged prostate with mass effect along the base of the bladder. Prostate size again proximally 7 x 6 x 5.4 cm. Other: Small fat containing umbilical hernia. No free air or free fluid. Musculoskeletal: Mild degenerative changes are seen along the spine and pelvis. Review of the MIP images confirms the above findings. IMPRESSION: Mild atherosclerotic calcified plaque. No thoracic aortic dissection or aneurysm formation. 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 Recommend follow-up ultrasound every 5 years. This recommendation follows ACR consensus guidelines: White Paper of the ACR Incidental Findings Committee II on Vascular Findings. J Am Coll Radiol 2013; 10:789-794.  Ectatic bilateral common iliac arteries, right-greater-than-left. Bilateral nonobstructing renal stones. Large right inguinal hernia involving small bowel without obstruction. Tiny lung nodules measuring up to 3 mm. No follow-up needed if patient is low-risk (and has no known or suspected primary neoplasm). Non-contrast chest CT can be considered in 12 months if patient is high-risk. This recommendation follows the consensus statement: Guidelines for Management of Incidental Pulmonary Nodules Detected on CT Images: From the Fleischner Society 2017; Radiology 2017; 284:228-243. Enlarged prostate. Please correlate with the history in the patient's PSA Electronically Signed   By: Jill Side M.D.   On: 07/21/2022 17:35   DG Chest Portable 1 View  Result Date: 07/21/2022 CLINICAL DATA:  Shortness of breath EXAM: PORTABLE CHEST 1 VIEW COMPARISON:  None Available. FINDINGS: The heart size and mediastinal contours are within normal limits. Both lungs are clear. The visualized skeletal structures are unremarkable. IMPRESSION: No acute abnormality of the lungs in AP portable projection. Electronically Signed   By: Delanna Ahmadi M.D.   On: 07/21/2022 15:57    EKG: Independently reviewed. Sinus rhythm.   Assessment/Plan   1. NSTEMI  - Presents with exertional chest pain and found to have elevated troponin  - He was treated with ASA and started on IV heparin in ED and cardiology was consulted by ED MD  - Continue cardiac monitoring, continue IV heparin, give Lipitor 80 now, give oral Lopressor now (BP is elevated) and resume Toprol in am, continue aspirin, avoid nitroglycerin (became hypotensive after 1 SL nitro in ED), use oxycodone or morphine if needed, follow-up on cardiology recommendations   2. Hypertension  - Continue beta-blocker and ARB   3. Incidental CT findings  - Ectasia of abdominal aorta, pulmonary nodules, and enlarged prostate noted on CT in ED  - Outpatient follow-up recommended    DVT  prophylaxis: IV heparin  Code Status: Full  Level of Care: Level of care: Telemetry Cardiac Family Communication: Wife and daughter at bedside  Disposition Plan:  Patient is from: home  Anticipated d/c is to: Home  Anticipated d/c date is: Will depend on cardiology recommendations  Patient currently: Pending cardiology consultation  Consults called: Cardiology  Admission status: Observation     Vianne Bulls, MD [facility]  07/21/2022, 10:09 PM

## 2022-07-21 NOTE — ED Notes (Signed)
[facility] at bedside for transport. 

## 2022-07-21 NOTE — Telephone Encounter (Signed)
FYI: This call has been transferred to Access Nurse. Once the result note has been entered staff can address the message at that time.  Patient called in with the following symptoms:  Red Word:chest pain   Please advise at Home [phone]  Pt has appt at 11:40am today with Carollee Herter, MD  Message is routed to Provider Pool and [facility] Triage

## 2022-07-21 NOTE — Telephone Encounter (Signed)
CRITICAL VALUE STICKER  CRITICAL VALUE: Troponin: 56  RECEIVER (on-site recipient of call): Kristine Garbe, [place] NOTIFIED: 07/21/22, 1:59 PM  MESSENGER (representative from lab): [place]  MD NOTIFIED: Percell Miller (PCP), Dr Etter Sjogren (treating Provider)  TIME OF NOTIFICATION: 1:59 PM  RESPONSE: pending

## 2022-07-21 NOTE — ED Provider Notes (Addendum)
[facility] [place] [facility] Provider Note   CSN: [id number] Arrival date & time: 07/21/22  1505     History  Chief Complaint  Patient presents with   Chest Pain    Bryan Craig is a 72 y.o. male.   Chest Pain    72 year old male with medical history significant for HTN, HLD, nephrolithiasis who presents to the emergency department with chest pain and elevated troponin outpatient.  The patient states that he woke up this morning and developed substernal chest pain, described as a pressure, no radiation.  No cough, fever or chills.  The patient was seen by his PCP earlier this morning and had an EKG and troponin testing and was told to go to the emergency department because his troponin outpatient was elevated.  He states that his chest pain has since resolved.  He denies any lower extremity swelling.  He denies any history of ACS, CHF, PE.  Home Medications Prior to Admission medications   Medication Sig Start Date End Date Taking? Authorizing Provider  atorvastatin (LIPITOR) 20 MG tablet Take 1 tablet (20 mg total) by mouth daily. 11/25/21   Saguier, Percell Miller, PA-C  Difluprednate 0.05 % EMUL Administer 1 drop into the left eye daily. 08/06/15   [provider]  hydrochlorothiazide (HYDRODIURIL) 25 MG tablet Take 1 tablet (25 mg total) by mouth daily. 07/21/22   Ann Held, DO  metoprolol succinate (TOPROL-XL) 50 MG 24 hr tablet Take 1 tablet (50 mg total) by mouth daily. 11/11/21   Saguier, Percell Miller, PA-C  Risankizumab-rzaa [facility]) 150 MG/ML SOSY Inject into the skin once a week.    [provider]  telmisartan (MICARDIS) 80 MG tablet Take 1 tablet (80 mg total) by mouth daily. 11/11/21   Saguier, Percell Miller, PA-C  triamcinolone cream (KENALOG) 0.1 % Apply 1 Application topically 2 (two) times daily. 11/11/21   Saguier, Percell Miller, PA-C      Allergies    Lisinopril and Penicillins    Review of Systems   Review of Systems   Cardiovascular:  Positive for chest pain.  All other systems reviewed and are negative.   Physical Exam Updated Vital Signs BP (!) 127/92   Pulse 65   Temp 97.8 F (36.6 C) (Oral)   Resp 20   SpO2 96%  Physical Exam Vitals and nursing note reviewed.  Constitutional:      General: He is not in acute distress.    Appearance: He is well-developed.  HENT:     Head: Normocephalic and atraumatic.  Eyes:     Conjunctiva/sclera: Conjunctivae normal.  Cardiovascular:     Rate and Rhythm: Normal rate and regular rhythm.     Heart sounds: No murmur heard. Pulmonary:     Effort: Pulmonary effort is normal. No respiratory distress.     Breath sounds: Normal breath sounds.  Abdominal:     Palpations: Abdomen is soft.     Tenderness: There is no abdominal tenderness.  Musculoskeletal:        General: No swelling.     Cervical back: Neck supple.     Right lower leg: No edema.     Left lower leg: No edema.  Skin:    General: Skin is warm and dry.     Capillary Refill: Capillary refill takes less than 2 seconds.  Neurological:     Mental Status: He is alert.  Psychiatric:        Mood and Affect: Mood normal.  ED Results / Procedures / Treatments   Labs (all labs ordered are listed, but only abnormal results are displayed) Labs Reviewed  TROPONIN I (HIGH SENSITIVITY) - Abnormal; Notable for the following components:      Result Value   Troponin I (High Sensitivity) 159 (*)    All other components within normal limits  D-DIMER, QUANTITATIVE  HEPARIN LEVEL (UNFRACTIONATED)  TROPONIN I (HIGH SENSITIVITY)    EKG EKG Interpretation  Date/Time:  Tuesday July 21 2022 15:19:27 EDT Ventricular Rate:  62 PR Interval:  169 QRS Duration: 86 QT Interval:  378 QTC Calculation: 384 R Axis:   15 Text Interpretation: Sinus rhythm Abnormal R-wave progression, early transition No previous ECGs available Confirmed by Regan Lemming ([id number]) on 07/21/2022 3:36:59 PM  Radiology CT  Angio Chest/Abd/Pel for Dissection W and/or Wo Contrast  Result Date: 07/21/2022 CLINICAL DATA:  Acute aortic syndrome.  Shortness of breath. EXAM: CT ANGIOGRAPHY CHEST, ABDOMEN AND PELVIS TECHNIQUE: Non-contrast CT of the chest was initially obtained. Multidetector CT imaging through the chest, abdomen and pelvis was performed using the standard protocol during bolus administration of intravenous contrast. Multiplanar reconstructed images and MIPs were obtained and reviewed to evaluate the vascular anatomy. RADIATION DOSE REDUCTION: This exam was performed according to the departmental dose-optimization program which includes automated exposure control, adjustment of the mA and/or kV according to patient size and/or use of iterative reconstruction technique. CONTRAST:  126mL OMNIPAQUE IOHEXOL 350 MG/ML SOLN COMPARISON:  Report outside study 04/27/2016. Abdomen pelvis CT 03/03/2021. Chest x-ray earlier 07/21/2022 FINDINGS: CTA CHEST FINDINGS Cardiovascular: Pulsation artifact along the ascending aorta. No mediastinal hematoma. There is mild atherosclerotic plaque identified along the thoracic aorta. No dissection or aneurysm formation. Diameter of the ascending aorta proximally at its origin has a diameter of 2.5 cm. Ascending aorta at the level of the right pulmonary artery 3.1 x 3.0 cm. Diameter of the aortic arch distally has a measurement of 2.7 cm. The descending thoracic aorta at the level of the right pulmonary artery measures 2.6 x 2.5 cm. The origin of the great vessels is preserved. Mild coronary artery calcifications are seen. The heart is nonenlarged. No pericardial effusion. Mediastinum/Nodes: No specific abnormal lymph node enlargement seen in the axillary region, hilum or mediastinum. No pericardial effusion. Normal caliber thoracic esophagus. Lungs/Pleura: No consolidation, pneumothorax or effusion. There is some basilar atelectasis. 3 mm nodule right lung anteriorly on series 8, image 40. Few  other tiny nodules as well similar size. Musculoskeletal: Mild degenerative changes are seen along the spine. Sclerotic focus within the vertebral body at T3, possible bone island. Review of the MIP images confirms the above findings. CTA ABDOMEN AND PELVIS FINDINGS VASCULAR Aorta: Mild atherosclerotic changes along the abdominal aorta. There is a focal area of ectasia of the infrarenal abdominal aorta measuring 2.7 by 1.9 cm. This is at the level of the origin of the IMA. No dissection. Celiac: Patent without evidence of aneurysm, dissection, vasculitis or significant stenosis. SMA: Patent without evidence of aneurysm, dissection, vasculitis or significant stenosis. Renals: Both renal arteries are patent without evidence of aneurysm, dissection, vasculitis, fibromuscular dysplasia or significant stenosis. IMA: Patent without evidence of aneurysm, dissection, vasculitis or significant stenosis. Inflow: Ectatic right common iliac artery measuring up to 20 mm in diameter. The left is also ectatic but to a lesser extent measuring a diameter of 16 mm. Mild plaque. No significant stenosis. Veins: No obvious venous abnormality within the limitations of this arterial phase study. Retroaortic left renal vein. Review of  the MIP images confirms the above findings. NON-VASCULAR Hepatobiliary: With the limits of the early phase of the contrast bolus, the liver is grossly preserved. The gallbladder is nondilated. Pancreas: Unremarkable. No pancreatic ductal dilatation or surrounding inflammatory changes. Spleen: Normal in size without focal abnormality. Adrenals/Urinary Tract: The right adrenal gland is preserved. The left is slightly thickened and nodular. Unchanged from previous. No collecting system dilatation of either kidney. There are some nonobstructing renal stones as seen previously. Example on the left measures 6 mm and right 2 mm. The ureters have normal course and caliber down to the distended urinary bladder.  Stomach/Bowel: There is a large right inguinal hernia involving mesenteric fat and loops of small bowel. No obstruction. Scattered colonic stool. Slightly redundant course to the sigmoid colon. Few colonic diverticula. There is also loops of transverse colon extending between the anterior liver margin in the anterior abdominal wall. Normal appendix. The stomach is moderately distended with debris and fluid. Again no bowel dilatation. Lymphatic: No specific abnormal lymph node enlargement. Reproductive: Enlarged prostate with mass effect along the base of the bladder. Prostate size again proximally 7 x 6 x 5.4 cm. Other: Small fat containing umbilical hernia. No free air or free fluid. Musculoskeletal: Mild degenerative changes are seen along the spine and pelvis. Review of the MIP images confirms the above findings. IMPRESSION: Mild atherosclerotic calcified plaque. No thoracic aortic dissection or aneurysm formation. 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 Recommend follow-up ultrasound every 5 years. This recommendation follows ACR consensus guidelines: White Paper of the ACR Incidental Findings Committee II on Vascular Findings. J Am Coll Radiol 2013; 10:789-794. Ectatic bilateral common iliac arteries, right-greater-than-left. Bilateral nonobstructing renal stones. Large right inguinal hernia involving small bowel without obstruction. Tiny lung nodules measuring up to 3 mm. No follow-up needed if patient is low-risk (and has no known or suspected primary neoplasm). Non-contrast chest CT can be considered in 12 months if patient is high-risk. This recommendation follows the consensus statement: Guidelines for Management of Incidental Pulmonary Nodules Detected on CT Images: From the Fleischner Society 2017; Radiology 2017; 284:228-243. Enlarged prostate. Please correlate with the history in the patient's PSA Electronically Signed   By: Jill Side M.D.    On: 07/21/2022 17:35   DG Chest Portable 1 View  Result Date: 07/21/2022 CLINICAL DATA:  Shortness of breath EXAM: PORTABLE CHEST 1 VIEW COMPARISON:  None Available. FINDINGS: The heart size and mediastinal contours are within normal limits. Both lungs are clear. The visualized skeletal structures are unremarkable. IMPRESSION: No acute abnormality of the lungs in AP portable projection. Electronically Signed   By: Delanna Ahmadi M.D.   On: 07/21/2022 15:57    Procedures .Critical Care  Performed by: Regan Lemming, MD Authorized by: Regan Lemming, MD   Critical care provider statement:    Critical care time (minutes):  30   Critical care was necessary to treat or prevent imminent or life-threatening deterioration of the following conditions: NSTEMI on heparin requiring transfer.   Critical care was time spent personally by me on the following activities:  Development of treatment plan with patient or surrogate, discussions with consultants, evaluation of patient's response to treatment, examination of patient, ordering and review of laboratory studies, ordering and review of radiographic studies, ordering and performing treatments and interventions, pulse oximetry, re-evaluation of patient's condition and review of old charts   Care discussed with: admitting provider  Medications Ordered in ED Medications  aspirin tablet 325 mg (325 mg Oral Given 07/21/22 1614)  nitroGLYCERIN (NITROSTAT) SL tablet 0.4 mg (0.4 mg Sublingual Given 07/21/22 1614)  heparin ADULT infusion 100 units/mL (25000 units/213mL) (1,100 Units/hr Intravenous New Bag/Given 07/21/22 1751)  heparin bolus via infusion 4,000 Units (4,000 Units Intravenous Bolus from Bag 07/21/22 1751)  iohexol (OMNIPAQUE) 350 MG/ML injection 100 mL (100 mLs Intravenous Contrast Given 07/21/22 1653)    ED Course/ Medical Decision Making/ A&P Clinical Course as of 07/21/22 1756  Tue Jul 21, 2022  1611 Troponin I (High Sensitivity)(!!): 159  [JL]    Clinical Course User Index [JL] Regan Lemming, MD                             Medical Decision Making Amount and/or Complexity of Data Reviewed Labs: ordered. Decision-making details documented in ED Course. Radiology: ordered.  Risk OTC drugs. Prescription drug management. Decision regarding hospitalization.     72 year old male with medical history significant for HTN, HLD, nephrolithiasis who presents to the emergency department with chest pain and elevated troponin outpatient.  The patient states that he woke up this morning and developed substernal chest pain, described as a pressure, no radiation.  No cough, fever or chills.  The patient was seen by his PCP earlier this morning and had an EKG and troponin testing and was told to go to the emergency department because his troponin outpatient was elevated.  He states that his chest pain has since resolved.  He denies any lower extremity swelling.  He denies any history of ACS, CHF, PE.  On arrival, the patient was afebrile, not tachycardic, sinus rhythm noted on cardiac telemetry, hypertensive BP 164/93, saturating well on room air.  Physical exam generally unremarkable.  Patient had recurrence of chest pain while in the emergency department and was administered aspirin and nitroglycerin.  His EKG did not reveal evidence of STEMI, sinus rhythm, ventricular rate 62, abnormal R wave progression, no prior EKGs for comparison, no significantly abnormal intervals.  Initial troponin has been elevated outpatient, repeat was uptrending to 159, D-dimer was negative.  A chest x-ray was performed which revealed no acute abnormality.  Concern for NSTEMI in the setting of patient with ongoing chest discomfort.  No ripping tearing component or radiation to the back, no concern for aortic dissection, intact symmetric bilateral pulses.  No concern for PE with normal D-dimer, no cough or shortness of breath. He was administered a single sublingual  nitroglycerin which bottomed out his blood pressure and he was administered an IV fluid bolus with subsequent improvement.  The nitroglycerin did resolve his pain.   The patient reports new onset back pain. CTA ordered to evaluate for dissection: IMPRESSION:  Mild atherosclerotic calcified plaque. No thoracic aortic dissection  or aneurysm formation.    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 Recommend follow-up ultrasound every 5  years. This recommendation follows ACR consensus guidelines: White  Paper of the ACR Incidental Findings Committee II on Vascular  Findings. J Am Coll Radiol 2013; 10:789-794.    Ectatic bilateral common iliac arteries, right-greater-than-left.    Bilateral nonobstructing renal stones.    Large right inguinal hernia involving small bowel without  obstruction.    Tiny lung nodules measuring up to 3 mm. No follow-up needed if  patient is low-risk (and has no known or suspected primary  neoplasm). Non-contrast chest CT can be considered in 12 months if  patient is high-risk. This recommendation follows the consensus  statement: Guidelines for Management of Incidental Pulmonary Nodules  Detected on CT Images: From the Fleischner Society 2017; Radiology  2017; 284:228-243.    Enlarged prostate. Please correlate with the history in the  patient's PSA   Negative dissection workup, presentation consistent with NSTEMI. The patient was started on heparin and hospitalist medicine was consulted for admission.  Spoke with Dr. Margaretann Loveless of cardiology who will see the patient in consultation inpatient.   Final Clinical Impression(s) / ED Diagnoses Final diagnoses:  NSTEMI (non-ST elevated myocardial infarction) [facility])    Rx / [place] Orders ED Discharge Orders     None         Regan Lemming, MD 07/21/22 1745    Regan Lemming, MD 07/21/22 1756

## 2022-07-21 NOTE — ED Notes (Signed)
ED Provider at bedside. 

## 2022-07-21 NOTE — Telephone Encounter (Signed)
Pt called. LDVM to go to the ER

## 2022-07-21 NOTE — Telephone Encounter (Signed)
Initial Comment Caller says that about an hour ago he had a feeling like a pressure/pain sensation on his chest that left him weak; escpecially on both of his arms. Translation Yes Translation Language Spanish Nurse Assessment Nurse: Lucky Cowboy, RN, Levada Dy Date/Time (Eastern Time): 07/21/2022 11:13:27 AM Confirm and document reason for call. If symptomatic, describe symptoms. ---Caller stated that he has been having pain and pressure in his chest that is going into his arms, started this morning. Denies any pain or pressure now. The pain lasted 10-15 min, never had it before. Does the patient have any new or worsening symptoms? ---Yes Will a triage be completed? ---Yes Related visit to physician within the last 2 weeks? ---No Does the PT have any chronic conditions? (i.e. diabetes, asthma, this includes High risk factors for pregnancy, etc.) ---Yes List chronic conditions. ---HTN, high cholesterol Is this a behavioral health or substance abuse call? ---No Guidelines Guideline Title Affirmed Question Affirmed Notes Nurse Date/Time Eilene Ghazi Time) Chest Pain [1] Chest pain lasts > 5 minutes AND [2] age > 67 Dew, RN, Levada Dy 07/21/2022 11:16:52 AM Disp. Time Eilene Ghazi Time) Disposition Final User 07/21/2022 11:08:53 AM Send to Urgent Deatra Ina, Rosie PLEASE NOTE: All timestamps contained within this report are represented as [place] Standard Time. CONFIDENTIALTY NOTICE: This fax transmission is intended only for the addressee. It contains information that is legally privileged, confidential or otherwise protected from use or disclosure. If you are not the intended recipient, you are strictly prohibited from reviewing, disclosing, copying using or disseminating any of this information or taking any action in reliance on or regarding this information. If you have received this fax in error, please notify us immediately by telephone so that we can arrange for its return to [place]. Phone:  [phone], Toll-Free: [phone], Fax: [phone] Page: 2 of 2 Call Id: [id number] [place]. Time Eilene Ghazi Time) Disposition Final User 07/21/2022 11:21:37 AM Call EMS 911 Now Yes Dew, RN, Levada Dy 07/21/2022 11:23:45 AM 911 Outcome Documentation Dew, RN, Levada Dy Reason: dtr is driving to ER instead Final Disposition 07/21/2022 11:21:37 AM Call EMS 911 Now Yes Dew, RN, Marin Shutter Disagree/Comply Disagree Caller Understands Yes PreDisposition Go to ED Care Advice Given Per Guideline CALL EMS 911 NOW: CARE ADVICE given per Chest Pain (Adult) guideline. * Immediate medical attention is needed. You need to hang up and call 911 (or an ambulance). Comments User: Raford Pitcher, RN Date/Time Eilene Ghazi Time): 07/21/2022 11:24:39 AM Dtr hung up before they told me which ER that they were driving to. Of note, she said they have an app't in 10 min, then said they were going to the ER, so I'm not sure which is the case because they hung up before I could get an answer. Referrals GO TO FACILITY UNDECIDED

## 2022-07-21 NOTE — Plan of Care (Signed)

## 2022-07-21 NOTE — ED Notes (Signed)
[place] for transport talked to [place] at 6:00

## 2022-07-21 NOTE — Patient Instructions (Signed)
Nonspecific Chest Pain, Adult Chest pain is an uncomfortable, tight, or painful feeling in the chest. The pain can feel like a crushing, aching, or squeezing pressure. A person can feel a burning or tingling sensation. Chest pain can also be felt in your back, neck, jaw, shoulder, or arm. This pain can be worse when you move, sneeze, or take a deep breath. Chest pain can be caused by a condition that is life-threatening. This must be treated right away. It can also be caused by something that is not life-threatening. If you have chest pain, it can be hard to know the difference, so it is important to get help right away to make sure that you do not have a serious condition. Some life-threatening causes of chest pain include: Heart attack. A tear in the body's main blood vessel (aortic dissection). Inflammation around your heart (pericarditis). A problem in the lungs, such as a blood clot (pulmonary embolism) or a collapsed lung (pneumothorax). Some non life-threatening causes of chest pain include: Heartburn. Anxiety or stress. Damage to the bones, muscles, and cartilage that make up your chest wall. Pneumonia or bronchitis. Shingles infection (varicella-zoster virus). Your chest pain may come and go. It may also be constant. Your health care provider will do tests and other studies to find the cause of your pain. Treatment will depend on the cause of your chest pain. Follow these instructions at home: Medicines Take over-the-counter and prescription medicines only as told by your health care provider. If you were prescribed an antibiotic medicine, take it as told by your health care provider. Do not stop taking the antibiotic even if you start to feel better. Activity Avoid any activities that cause chest pain. Do not lift anything that is heavier than 10 lb (4.5 kg), or the limit that you are told, until your health care provider says that it is safe. Rest as directed by your health care  provider. Return to your normal activities only as told by your health care provider. Ask your health care provider what activities are safe for you. Lifestyle     Do not use any products that contain nicotine or tobacco, such as cigarettes, e-cigarettes, and chewing tobacco. If you need help quitting, ask your health care provider. Do not drink alcohol. Make healthy lifestyle changes as recommended. These may include: Getting regular exercise. Ask your health care provider to suggest some exercises that are safe for you. Eating a heart-healthy diet. This includes plenty of fresh fruits and vegetables, whole grains, low-fat (lean) protein, and low-fat dairy products. A dietitian can help you find healthy eating options. Maintaining a healthy weight. Managing any other health conditions you may have, such as high blood pressure (hypertension) or diabetes. Reducing stress, such as with yoga or relaxation techniques. General instructions Pay attention to any changes in your symptoms. It is up to you to get the results of any tests that were done. Ask your health care provider, or the department that is doing the tests, when your results will be ready. Keep all follow-up visits as told by your health care provider. This is important. You may be asked to go for further testing if your chest pain does not go away. Contact a health care provider if: Your chest pain does not go away. You feel depressed. You have a fever. You notice changes in your symptoms or develop new symptoms. Get help right away if: Your chest pain gets worse. You have a cough that gets worse, or you   cough up blood. You have severe pain in your abdomen. You faint. You have sudden, unexplained chest discomfort. You have sudden, unexplained discomfort in your arms, back, neck, or jaw. You have shortness of breath at any time. You suddenly start to sweat, or your skin gets clammy. You feel nausea or you vomit. You  suddenly feel lightheaded or dizzy. You have severe weakness, or unexplained weakness or fatigue. Your heart begins to beat quickly, or it feels like it is skipping beats. These symptoms may represent a serious problem that is an emergency. Do not wait to see if the symptoms will go away. Get medical help right away. Call your local emergency services (911 in the [place] Do not drive yourself to the hospital. Summary Chest pain can be caused by a condition that is serious and requires urgent treatment. It may also be caused by something that is not life-threatening. Your health care provider may do lab tests and other studies to find the cause of your pain. Follow your health care provider's instructions on taking medicines, making lifestyle changes, and getting emergency treatment if symptoms become worse. Keep all follow-up visits as told by your health care provider. This includes visits for any further testing if your chest pain does not go away. This information is not intended to replace advice given to you by your health care provider. Make sure you discuss any questions you have with your health care provider. Document Revised: 02/26/2022 Document Reviewed: 02/26/2022 Elsevier Patient Education  2023 [employer] Inc.  

## 2022-07-21 NOTE — ED Notes (Signed)
Pt very sensitive to Nitro, NS bolus started, pt became diaphoretic at time of reassessment.   Pt now starting to feel better.  BP trending upward

## 2022-07-22 ENCOUNTER — Ambulatory Visit (HOSPITAL_BASED_OUTPATIENT_CLINIC_OR_DEPARTMENT_OTHER): Payer: Medicare Other | Attending: Family Medicine

## 2022-07-22 ENCOUNTER — Encounter (HOSPITAL_COMMUNITY)
Admission: EM | Disposition: A | Discharge status: Home / Self Care | Payer: Self-pay | Source: Ambulatory Visit | Attending: Student

## 2022-07-22 DIAGNOSIS — N400 Enlarged prostate without lower urinary tract symptoms: Secondary | ICD-10-CM

## 2022-07-22 DIAGNOSIS — Z87442 Personal history of urinary calculi: Secondary | ICD-10-CM | POA: Diagnosis not present

## 2022-07-22 DIAGNOSIS — L409 Psoriasis, unspecified: Secondary | ICD-10-CM | POA: Diagnosis present

## 2022-07-22 DIAGNOSIS — I214 Non-ST elevation (NSTEMI) myocardial infarction: Secondary | ICD-10-CM | POA: Diagnosis present

## 2022-07-22 DIAGNOSIS — I959 Hypotension, unspecified: Secondary | ICD-10-CM

## 2022-07-22 DIAGNOSIS — I2510 Atherosclerotic heart disease of native coronary artery without angina pectoris: Secondary | ICD-10-CM | POA: Diagnosis not present

## 2022-07-22 DIAGNOSIS — Z8249 Family history of ischemic heart disease and other diseases of the circulatory system: Secondary | ICD-10-CM | POA: Diagnosis not present

## 2022-07-22 DIAGNOSIS — R918 Other nonspecific abnormal finding of lung field: Secondary | ICD-10-CM | POA: Diagnosis present

## 2022-07-22 DIAGNOSIS — Z7982 Long term (current) use of aspirin: Secondary | ICD-10-CM | POA: Diagnosis not present

## 2022-07-22 DIAGNOSIS — R079 Chest pain, unspecified: Secondary | ICD-10-CM | POA: Insufficient documentation

## 2022-07-22 DIAGNOSIS — I10 Essential (primary) hypertension: Secondary | ICD-10-CM | POA: Diagnosis present

## 2022-07-22 DIAGNOSIS — Z79899 Other long term (current) drug therapy: Secondary | ICD-10-CM | POA: Diagnosis not present

## 2022-07-22 DIAGNOSIS — I77811 Abdominal aortic ectasia: Secondary | ICD-10-CM | POA: Diagnosis present

## 2022-07-22 DIAGNOSIS — I25110 Atherosclerotic heart disease of native coronary artery with unstable angina pectoris: Secondary | ICD-10-CM | POA: Diagnosis present

## 2022-07-22 DIAGNOSIS — E785 Hyperlipidemia, unspecified: Secondary | ICD-10-CM | POA: Diagnosis present

## 2022-07-22 HISTORY — PX: LEFT HEART CATH AND CORONARY ANGIOGRAPHY: CATH118249

## 2022-07-22 HISTORY — PX: CORONARY STENT INTERVENTION: CATH118234

## 2022-07-22 LAB — CBC
HCT: 44.1 % (ref 39.0–52.0)
Hemoglobin: 15.5 g/dL (ref 13.0–17.0)
MCH: 30.9 pg (ref 26.0–34.0)
MCHC: 35.1 g/dL (ref 30.0–36.0)
MCV: 88 fL (ref 80.0–100.0)
Platelets: 242 10*3/uL (ref 150–400)
RBC: 5.01 MIL/uL (ref 4.22–5.81)
RDW: 12.7 % (ref 11.5–15.5)
WBC: 9.9 10*3/uL (ref 4.0–10.5)
nRBC: 0 % (ref 0.0–0.2)

## 2022-07-22 LAB — HEPARIN LEVEL (UNFRACTIONATED)
Heparin Unfractionated: 0.36 IU/mL (ref 0.30–0.70)
Heparin Unfractionated: 0.34 IU/mL (ref 0.30–0.70)

## 2022-07-22 LAB — BASIC METABOLIC PANEL
Anion gap: 12 (ref 5–15)
BUN: 7 mg/dL — ABNORMAL LOW (ref 8–23)
CO2: 24 mmol/L (ref 22–32)
Calcium: 9.1 mg/dL (ref 8.9–10.3)
Chloride: 99 mmol/L (ref 98–111)
Creatinine, Ser: 0.9 mg/dL (ref 0.61–1.24)
GFR, Estimated: >60 mL/min (ref >60)
Glucose, Bld: 123 mg/dL — ABNORMAL HIGH (ref 70–99)
Potassium: 4.4 mmol/L (ref 3.5–5.1)
Sodium: 135 mmol/L (ref 135–145)

## 2022-07-22 LAB — HEMOGLOBIN A1C
Hgb A1c MFr Bld: 5.8 % — ABNORMAL HIGH (ref 4.8–5.6)
Mean Plasma Glucose: 120 mg/dL

## 2022-07-22 LAB — MAGNESIUM: Magnesium: 2 mg/dL (ref 1.7–2.4)

## 2022-07-22 LAB — POCT ACTIVATED CLOTTING TIME: Activated Clotting Time: 287 seconds

## 2022-07-22 SURGERY — LEFT HEART CATH AND CORONARY ANGIOGRAPHY
Anesthesia: LOCAL

## 2022-07-22 MED ORDER — SODIUM CHLORIDE 0.9 % WEIGHT BASED INFUSION: 1.0000 mL/kg/h | INTRAVENOUS | Status: DC

## 2022-07-22 MED ORDER — HEPARIN (PORCINE) IN NACL 1000-0.9 UT/500ML-% IV SOLN
INTRAVENOUS | Status: DC | PRN
  Administered 2022-07-22 (×2): 500 mL

## 2022-07-22 MED ORDER — SODIUM CHLORIDE 0.9 % WEIGHT BASED INFUSION
3.0000 mL/kg/h | INTRAVENOUS | Status: DC
  Administered 2022-07-22: 3 mL/kg/h via INTRAVENOUS

## 2022-07-22 MED ORDER — TIROFIBAN (AGGRASTAT) BOLUS VIA INFUSION
INTRAVENOUS | Status: DC | PRN
  Administered 2022-07-22: 2117.5 ug via INTRAVENOUS

## 2022-07-22 MED ORDER — VERAPAMIL HCL 2.5 MG/ML IV SOLN
INTRAVENOUS | Status: DC | PRN
  Administered 2022-07-22: 10 mL via INTRA_ARTERIAL

## 2022-07-22 MED ORDER — SODIUM CHLORIDE 0.9% FLUSH: 3.0000 mL | INTRAVENOUS | Status: DC | PRN

## 2022-07-22 MED ORDER — ACETAMINOPHEN 325 MG PO TABS: 650.0000 mg | ORAL_TABLET | ORAL | Status: DC | PRN

## 2022-07-22 MED ORDER — LABETALOL HCL 5 MG/ML IV SOLN: 10.0000 mg | INTRAVENOUS | Status: AC | PRN

## 2022-07-22 MED ORDER — FENTANYL CITRATE (PF) 100 MCG/2ML IJ SOLN
INTRAMUSCULAR | Status: AC
  Filled 2022-07-22: qty 2

## 2022-07-22 MED ORDER — LIDOCAINE HCL (PF) 1 % IJ SOLN
INTRAMUSCULAR | Status: AC
  Filled 2022-07-22: qty 30

## 2022-07-22 MED ORDER — METOPROLOL TARTRATE 5 MG/5ML IV SOLN
5.0000 mg | Freq: Once | INTRAVENOUS | Status: AC
  Administered 2022-07-22: 5 mg via INTRAVENOUS
  Filled 2022-07-22: qty 5

## 2022-07-22 MED ORDER — MIDAZOLAM HCL 2 MG/2ML IJ SOLN
INTRAMUSCULAR | Status: AC
  Filled 2022-07-22: qty 2

## 2022-07-22 MED ORDER — HEPARIN SODIUM (PORCINE) 1000 UNIT/ML IJ SOLN
INTRAMUSCULAR | Status: AC
  Filled 2022-07-22: qty 10

## 2022-07-22 MED ORDER — TIROFIBAN HCL IN NACL 5-0.9 MG/100ML-% IV SOLN
INTRAVENOUS | Status: DC | PRN
  Administered 2022-07-22: .15 ug/kg/min via INTRAVENOUS

## 2022-07-22 MED ORDER — FENTANYL CITRATE (PF) 100 MCG/2ML IJ SOLN
INTRAMUSCULAR | Status: DC | PRN
  Administered 2022-07-22: 50 ug via INTRAVENOUS

## 2022-07-22 MED ORDER — TICAGRELOR 90 MG PO TABS
90.0000 mg | ORAL_TABLET | Freq: Two times a day (BID) | ORAL | Status: DC
  Administered 2022-07-22 – 2022-07-23 (×2): 90 mg via ORAL
  Filled 2022-07-22 (×2): qty 1

## 2022-07-22 MED ORDER — IOHEXOL 350 MG/ML SOLN
INTRAVENOUS | Status: DC | PRN
  Administered 2022-07-22: 125 mL

## 2022-07-22 MED ORDER — SODIUM CHLORIDE 0.9 % IV SOLN: INTRAVENOUS | Status: AC

## 2022-07-22 MED ORDER — HYDRALAZINE HCL 20 MG/ML IJ SOLN: 10.0000 mg | INTRAMUSCULAR | Status: AC | PRN

## 2022-07-22 MED ORDER — NITROGLYCERIN 1 MG/10 ML FOR IR/CATH LAB
INTRA_ARTERIAL | Status: AC
  Filled 2022-07-22: qty 10

## 2022-07-22 MED ORDER — SODIUM CHLORIDE 0.9% FLUSH: 3.0000 mL | Freq: Two times a day (BID) | INTRAVENOUS | Status: DC

## 2022-07-22 MED ORDER — ONDANSETRON HCL 4 MG/2ML IJ SOLN: 4.0000 mg | Freq: Four times a day (QID) | INTRAMUSCULAR | Status: DC | PRN

## 2022-07-22 MED ORDER — TIROFIBAN HCL IN NACL 5-0.9 MG/100ML-% IV SOLN
INTRAVENOUS | Status: AC
  Filled 2022-07-22: qty 100

## 2022-07-22 MED ORDER — TICAGRELOR 90 MG PO TABS
ORAL_TABLET | ORAL | Status: DC | PRN
  Administered 2022-07-22: 180 mg via ORAL

## 2022-07-22 MED ORDER — SODIUM CHLORIDE 0.9 % WEIGHT BASED INFUSION: 3.0000 mL/kg/h | INTRAVENOUS | Status: DC

## 2022-07-22 MED ORDER — TICAGRELOR 90 MG PO TABS
ORAL_TABLET | ORAL | Status: AC
  Filled 2022-07-22: qty 1

## 2022-07-22 MED ORDER — VERAPAMIL HCL 2.5 MG/ML IV SOLN
INTRAVENOUS | Status: AC
  Filled 2022-07-22: qty 2

## 2022-07-22 MED ORDER — MIDAZOLAM HCL 2 MG/2ML IJ SOLN
INTRAMUSCULAR | Status: DC | PRN
  Administered 2022-07-22: 2 mg via INTRAVENOUS

## 2022-07-22 MED ORDER — LIDOCAINE HCL (PF) 1 % IJ SOLN
INTRAMUSCULAR | Status: DC | PRN
  Administered 2022-07-22: 2 mL via INTRADERMAL

## 2022-07-22 MED ORDER — HEPARIN SODIUM (PORCINE) 1000 UNIT/ML IJ SOLN
INTRAMUSCULAR | Status: DC | PRN
  Administered 2022-07-22 (×2): 5000 [IU] via INTRAVENOUS

## 2022-07-22 MED ORDER — SODIUM CHLORIDE 0.9 % IV SOLN: 250.0000 mL | INTRAVENOUS | Status: DC | PRN

## 2022-07-22 SURGICAL SUPPLY — 19 items
BALL SAPPHIRE NC24 2.75X26 (BALLOONS) ×1
BALLN SAPPHIRE 2.0X15 (BALLOONS) ×1
BALLOON SAPPHIRE 2.0X15 (BALLOONS) IMPLANT
BALLOON SAPPHIRE NC24 2.75X26 (BALLOONS) IMPLANT
CATH 5FR JL3.5 JR4 ANG PIG MP (CATHETERS) IMPLANT
CATH VISTA GUIDE 6FR XBLAD3.5 (CATHETERS) IMPLANT
DEVICE RAD COMP TR BAND LRG (VASCULAR PRODUCTS) IMPLANT
GLIDESHEATH SLEND SS 6F .021 (SHEATH) IMPLANT
GUIDEWIRE INQWIRE 1.5J.035X260 (WIRE) IMPLANT
INQWIRE 1.5J .035X260CM (WIRE) ×1
KIT ENCORE 26 ADVANTAGE (KITS) IMPLANT
KIT HEART LEFT (KITS) ×1 IMPLANT
PACK CARDIAC CATHETERIZATION (CUSTOM PROCEDURE TRAY) ×1 IMPLANT
STENT SYNERGY XD 2.50X32 (Permanent Stent) IMPLANT
SYNERGY XD 2.50X32 (Permanent Stent) ×1 IMPLANT
SYR MEDRAD MARK 7 150ML (SYRINGE) ×1 IMPLANT
TRANSDUCER W/STOPCOCK (MISCELLANEOUS) ×1 IMPLANT
TUBING CIL FLEX 10 FLL-RA (TUBING) ×1 IMPLANT
WIRE COUGAR XT STRL 190CM (WIRE) IMPLANT

## 2022-07-22 NOTE — H&P (View-Only) (Signed)
Cardiology Consultation   Patient ID: Bryan Craig MRN: [id number]; DOB: 1950-05-15  Admit date: 07/21/2022 Date of Consult: 07/22/2022  PCP:  Saguier, Edward, [place] Providers Cardiologist:  None   }     Patient Profile:   Bryan Craig is a 71 y.o. male with a hx of hypertension, hyperlipidemia, psoriasis, nephrolithiasis who is being seen 07/22/2022 for the evaluation of chest pain at the request of Dr. Cyndia Skeeters.  History of Present Illness:   Bryan Craig is a very active person and a nonsmoker who rides his bike frequently and has had no prior complaints of chest pain before. He has no prior cardiac history however does have a father who had MI.  Patient states since the wintertime he has had a "funny feeling, tiredness" in his chest that is associated with exertional activity specifically when he is riding his bike, and then relieved by rest.  Yesterday he states he was resting and started to feeling 5 out of 10 chest pain that was constant and sharp.  He said this pain continued to wax and wane throughout the day and now is feeling achy like somebody hit him.  This chest pain was associated with radiating pain to the back, minor shortness of breath, weakness, and sweatiness.  He still currently has some chest pain however says it is minor.  Denies any pleuritic chest pain, pain with sternal rub, burning after eating spicy or acidic foods, or while laying down, hematuria.  Has not had a recent fever or viral illness.  ED workup: EKG showing normal sinus rhythm without any signs of ischemic changes.  Chest x-ray negative, CTA of chest abdomen pelvis indicating focal ectasia of the infrarenal abdominal aorta, lung nodules, enlarged prostate.  Troponin elevated at 159-102.  He received aspirin and nitro and was placed on IV heparin.  Blood pressure dropped to 60 systolic after nitroglycerin was administered, and then given fluid bolus with recovery of BP.  Vital signs  stable.  Past Medical History:  Diagnosis Date   Hyperlipidemia    Hypertension    Kidney stone    Retinal detachment     Past Surgical History:  Procedure Laterality Date   EYE SURGERY       Inpatient Medications: Scheduled Meds:  aspirin EC  81 mg Oral Daily   atorvastatin  80 mg Oral QHS   irbesartan  300 mg Oral Daily   metoprolol succinate  50 mg Oral Daily   sodium chloride flush  3 mL Intravenous Q12H   Continuous Infusions:  sodium chloride     heparin 1,100 Units/hr (07/21/22 1751)   PRN Meds: sodium chloride, acetaminophen, morphine injection, ondansetron (ZOFRAN) IV, oxyCODONE, sodium chloride flush  Allergies:    Allergies  Allergen Reactions   Lisinopril     REACTION: cough   Penicillins     Social History:   Social History   Socioeconomic History   Marital status: Married    Spouse name: Not on file   Number of children: Not on file   Years of education: Not on file   Highest education level: Not on file  Occupational History   Not on file  Tobacco Use   Smoking status: Never   Smokeless tobacco: Never  Substance and Sexual Activity   Alcohol use: Yes    Comment: rare occasional social drinker.   Drug use: Never   Sexual activity: Yes  Other Topics Concern   Not on file  Social  History Narrative   Not on file   Social Determinants of Health   Financial Resource Strain: Not on file  Food Insecurity: Patient Declined (07/21/2022)   Hunger Vital Sign    Worried About Running Out of Food in the Last Year: Patient declined    [place] in the Last Year: Patient declined  Transportation Needs: Patient Declined (07/21/2022)   PRAPARE - Hydrologist (Medical): Patient declined    Lack of Transportation (Non-Medical): Patient declined  Physical Activity: Not on file  Stress: Not on file  Social Connections: Not on file  Intimate Partner Violence: Patient Declined (07/21/2022)   Humiliation, Afraid, Rape,  and Kick questionnaire    Fear of Current or Ex-Partner: Patient declined    Emotionally Abused: Patient declined    Physically Abused: Patient declined    Sexually Abused: Patient declined    Family History:   Father had a MI  ROS:  Please see the history of present illness.   All other ROS reviewed and negative.     Physical Exam/Data:   Vitals:   07/21/22 2352 07/22/22 0137 07/22/22 0510 07/22/22 0630  BP: (!) 150/95 (!) 152/96 (!) 153/100 136/70  Pulse: 61 73 68 (!) 58  Resp:  20 20   Temp:  98.3 F (36.8 C) 98.3 F (36.8 C)   TempSrc:  Oral Oral   SpO2: 99% 99% 97% 97%  Weight:      Height:        Intake/Output Summary (Last 24 hours) at 07/22/2022 0843 Last data filed at 07/21/2022 1931 Gross per 24 hour  Intake --  Output 900 ml  Net -900 ml      07/21/2022    8:56 PM 07/21/2022   11:40 AM 02/17/2022   11:00 AM  Last 3 Weights  Weight (lbs) 186 lb 11.2 oz 188 lb 184 lb  Weight (kg) 84.687 kg 85.276 kg 83.462 kg     Body mass index is 29.24 kg/m.  General:  Well nourished, well developed, in no acute distress HEENT: normal Neck: no JVD Vascular: No carotid bruits; Distal pulses 2+ bilaterally Cardiac:  normal S1, S2; RRR; no murmur  Lungs:  clear to auscultation bilaterally, no wheezing, rhonchi or rales  Abd: soft, nontender, no hepatomegaly  Ext: no edema Musculoskeletal:  No deformities, BUE and BLE strength normal and equal Skin: warm and dry  Neuro:  CNs 2-12 intact, no focal abnormalities noted Psych:  Normal affect   EKG:  The EKG was personally reviewed and demonstrates: Normal sinus rhythm with a heart rate of 62, normal axis, T wave inversion in aVR Telemetry:  Telemetry was personally reviewed and demonstrates: Normal sinus rhythm   Relevant CV Studies:   Laboratory Data:  High Sensitivity Troponin:   Recent Labs  Lab 07/21/22 1523 07/21/22 1730  TROPONINIHS 159* 102*     Chemistry Recent Labs  Lab 07/21/22 1215  07/22/22 0217  NA 137 135  K 4.4 4.4  CL 102 99  CO2 29 24  GLUCOSE 106* 123*  BUN 9 7*  CREATININE 0.97 0.90  CALCIUM 9.7 9.1  MG  --  2.0  GFRNONAA  --  >60  ANIONGAP  --  12    Recent Labs  Lab 07/21/22 1215  PROT 7.3  ALBUMIN 4.5  AST 19  ALT 19  ALKPHOS 75  BILITOT 0.5   Lipids  Recent Labs  Lab 07/21/22 1215  CHOL 225*  TRIG  205.0*  HDL 41.20  CHOLHDL 5    Hematology Recent Labs  Lab 07/21/22 1215 07/22/22 0217  WBC 5.7 9.9  RBC 4.80 5.01  HGB 15.0 15.5  HCT 43.0 44.1  MCV 89.6 88.0  MCH  --  30.9  MCHC 34.9 35.1  RDW 13.9 12.7  PLT 263.0 242   Thyroid  Recent Labs  Lab 07/21/22 1215  TSH 1.21    BNPNo results for input(s): "BNP", "PROBNP" in the last 168 hours.  DDimer  Recent Labs  Lab 07/21/22 1215 07/21/22 1524  DDIMER 0.38 0.29     Radiology/Studies:  CT Angio Chest/Abd/Pel for Dissection W and/or Wo Contrast  Result Date: 07/21/2022 CLINICAL DATA:  Acute aortic syndrome.  Shortness of breath. EXAM: CT ANGIOGRAPHY CHEST, ABDOMEN AND PELVIS TECHNIQUE: Non-contrast CT of the chest was initially obtained. Multidetector CT imaging through the chest, abdomen and pelvis was performed using the standard protocol during bolus administration of intravenous contrast. Multiplanar reconstructed images and MIPs were obtained and reviewed to evaluate the vascular anatomy. RADIATION DOSE REDUCTION: This exam was performed according to the departmental dose-optimization program which includes automated exposure control, adjustment of the mA and/or kV according to patient size and/or use of iterative reconstruction technique. CONTRAST:  166mL OMNIPAQUE IOHEXOL 350 MG/ML SOLN COMPARISON:  Report outside study 04/27/2016. Abdomen pelvis CT 03/03/2021. Chest x-ray earlier 07/21/2022 FINDINGS: CTA CHEST FINDINGS Cardiovascular: Pulsation artifact along the ascending aorta. No mediastinal hematoma. There is mild atherosclerotic plaque identified along the  thoracic aorta. No dissection or aneurysm formation. Diameter of the ascending aorta proximally at its origin has a diameter of 2.5 cm. Ascending aorta at the level of the right pulmonary artery 3.1 x 3.0 cm. Diameter of the aortic arch distally has a measurement of 2.7 cm. The descending thoracic aorta at the level of the right pulmonary artery measures 2.6 x 2.5 cm. The origin of the great vessels is preserved. Mild coronary artery calcifications are seen. The heart is nonenlarged. No pericardial effusion. Mediastinum/Nodes: No specific abnormal lymph node enlargement seen in the axillary region, hilum or mediastinum. No pericardial effusion. Normal caliber thoracic esophagus. Lungs/Pleura: No consolidation, pneumothorax or effusion. There is some basilar atelectasis. 3 mm nodule right lung anteriorly on series 8, image 40. Few other tiny nodules as well similar size. Musculoskeletal: Mild degenerative changes are seen along the spine. Sclerotic focus within the vertebral body at T3, possible bone island. Review of the MIP images confirms the above findings. CTA ABDOMEN AND PELVIS FINDINGS VASCULAR Aorta: Mild atherosclerotic changes along the abdominal aorta. There is a focal area of ectasia of the infrarenal abdominal aorta measuring 2.7 by 1.9 cm. This is at the level of the origin of the IMA. No dissection. Celiac: Patent without evidence of aneurysm, dissection, vasculitis or significant stenosis. SMA: Patent without evidence of aneurysm, dissection, vasculitis or significant stenosis. Renals: Both renal arteries are patent without evidence of aneurysm, dissection, vasculitis, fibromuscular dysplasia or significant stenosis. IMA: Patent without evidence of aneurysm, dissection, vasculitis or significant stenosis. Inflow: Ectatic right common iliac artery measuring up to 20 mm in diameter. The left is also ectatic but to a lesser extent measuring a diameter of 16 mm. Mild plaque. No significant stenosis.  Veins: No obvious venous abnormality within the limitations of this arterial phase study. Retroaortic left renal vein. Review of the MIP images confirms the above findings. NON-VASCULAR Hepatobiliary: With the limits of the early phase of the contrast bolus, the liver is grossly preserved. The gallbladder  is nondilated. Pancreas: Unremarkable. No pancreatic ductal dilatation or surrounding inflammatory changes. Spleen: Normal in size without focal abnormality. Adrenals/Urinary Tract: The right adrenal gland is preserved. The left is slightly thickened and nodular. Unchanged from previous. No collecting system dilatation of either kidney. There are some nonobstructing renal stones as seen previously. Example on the left measures 6 mm and right 2 mm. The ureters have normal course and caliber down to the distended urinary bladder. Stomach/Bowel: There is a large right inguinal hernia involving mesenteric fat and loops of small bowel. No obstruction. Scattered colonic stool. Slightly redundant course to the sigmoid colon. Few colonic diverticula. There is also loops of transverse colon extending between the anterior liver margin in the anterior abdominal wall. Normal appendix. The stomach is moderately distended with debris and fluid. Again no bowel dilatation. Lymphatic: No specific abnormal lymph node enlargement. Reproductive: Enlarged prostate with mass effect along the base of the bladder. Prostate size again proximally 7 x 6 x 5.4 cm. Other: Small fat containing umbilical hernia. No free air or free fluid. Musculoskeletal: Mild degenerative changes are seen along the spine and pelvis. Review of the MIP images confirms the above findings. IMPRESSION: Mild atherosclerotic calcified plaque. No thoracic aortic dissection or aneurysm formation. 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 Recommend follow-up ultrasound every 5 years. This recommendation follows ACR  consensus guidelines: White Paper of the ACR Incidental Findings Committee II on Vascular Findings. J Am Coll Radiol 2013; 10:789-794. Ectatic bilateral common iliac arteries, right-greater-than-left. Bilateral nonobstructing renal stones. Large right inguinal hernia involving small bowel without obstruction. Tiny lung nodules measuring up to 3 mm. No follow-up needed if patient is low-risk (and has no known or suspected primary neoplasm). Non-contrast chest CT can be considered in 12 months if patient is high-risk. This recommendation follows the consensus statement: Guidelines for Management of Incidental Pulmonary Nodules Detected on CT Images: From the Fleischner Society 2017; Radiology 2017; 284:228-243. Enlarged prostate. Please correlate with the history in the patient's PSA Electronically Signed   By: Jill Side M.D.   On: 07/21/2022 17:35   DG Chest Portable 1 View  Result Date: 07/21/2022 CLINICAL DATA:  Shortness of breath EXAM: PORTABLE CHEST 1 VIEW COMPARISON:  None Available. FINDINGS: The heart size and mediastinal contours are within normal limits. Both lungs are clear. The visualized skeletal structures are unremarkable. IMPRESSION: No acute abnormality of the lungs in AP portable projection. Electronically Signed   By: Delanna Ahmadi M.D.   On: 07/21/2022 15:57     Assessment and Plan:   NSTEMI Patient presenting with unstable anginal symptoms with risk factors including family history of cardiac disease, hyperlipidemia, hypertension.  EKG shows nonischemic changes however has had elevated troponins that have down trended from 159-102.  Given RF and concerning HPI, plan for heart catheterization.  Procedure has been explained in great detail. All questions have been answered and patient is agreeable to plan.  Plan for heart catheterization and TTE Continue IV heparin, aspirin 81mg , Troprol XL 50 mg daily,  atorvastatin 80 mg Avoid using nitroglycerin due to hypotensive  episode.  Shared Decision Making/Informed Consent{ The risks [stroke (1 in 1000), death (1 in 1000), kidney failure [usually temporary] (1 in 500), bleeding (1 in 200), allergic reaction [possibly serious] (1 in 200)], benefits (diagnostic support and management of coronary artery disease) and alternatives of a cardiac catheterization were discussed in detail with Bryan Craig and he is willing  to proceed.   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 Understands a lot of english, but more comfortable with interpreter*   Risk Assessment/Risk Scores:   TIMI Risk Score for Unstable Angina or Non-ST Elevation MI:   The patient's TIMI risk score is 5, which indicates a 26% risk of all cause mortality, new or recurrent myocardial infarction or need for urgent revascularization in the next 14 days.{  For questions or updates, please contact [place] Please consult www.Amion.com for contact info under    Signed, Bonnee Quin, PA-C  07/22/2022 8:43 AM  \ Agree with note by Lonzo Cloud, PA-C  Patient admitted with unstable angina.  Positive cardiac risk factors.  Enzymes elevated.  Exam benign.  Currently on IV heparin.  Plan coronary angiography today. The patient understands that risks included but are not limited to stroke (1 in 1000), death (1 in 109), kidney failure [usually temporary] (1 in 500), bleeding (1 in 200), allergic reaction [possibly serious] (1 in 200).  The patient understands and agrees to proceed   Lorretta Harp, M.D., [place], [facility], [place], [place] [address] [place], [place]  [id number]  [phone] 07/22/2022 11:00 AM

## 2022-07-22 NOTE — Consult Note (Addendum)
Cardiology Consultation   Patient ID: Kamarr Arts MRN: [id number]; DOB: [birth year]  Admit date: 07/21/2022 Date of Consult: 07/22/2022  PCP:  Saguier, Edward, [place] Providers Cardiologist:  None   }     Patient Profile:   Bryan Craig is a 72 y.o. male with a hx of hypertension, hyperlipidemia, psoriasis, nephrolithiasis who is being seen 07/22/2022 for the evaluation of chest pain at the request of Dr. Cyndia Skeeters.  History of Present Illness:   Mr. Tio is a very active person and a nonsmoker who rides his bike frequently and has had no prior complaints of chest pain before. He has no prior cardiac history however does have a father who had MI.  Patient states since the wintertime he has had a "funny feeling, tiredness" in his chest that is associated with exertional activity specifically when he is riding his bike, and then relieved by rest.  Yesterday he states he was resting and started to feeling 5 out of 10 chest pain that was constant and sharp.  He said this pain continued to wax and wane throughout the day and now is feeling achy like somebody hit him.  This chest pain was associated with radiating pain to the back, minor shortness of breath, weakness, and sweatiness.  He still currently has some chest pain however says it is minor.  Denies any pleuritic chest pain, pain with sternal rub, burning after eating spicy or acidic foods, or while laying down, hematuria.  Has not had a recent fever or viral illness.  ED workup: EKG showing normal sinus rhythm without any signs of ischemic changes.  Chest x-ray negative, CTA of chest abdomen pelvis indicating focal ectasia of the infrarenal abdominal aorta, lung nodules, enlarged prostate.  Troponin elevated at 159-102.  He received aspirin and nitro and was placed on IV heparin.  Blood pressure dropped to 60 systolic after nitroglycerin was administered, and then given fluid bolus with recovery of BP.  Vital signs  stable.  Past Medical History:  Diagnosis Date   Hyperlipidemia    Hypertension    Kidney stone    Retinal detachment     Past Surgical History:  Procedure Laterality Date   EYE SURGERY       Inpatient Medications: Scheduled Meds:  aspirin EC  81 mg Oral Daily   atorvastatin  80 mg Oral QHS   irbesartan  300 mg Oral Daily   metoprolol succinate  50 mg Oral Daily   sodium chloride flush  3 mL Intravenous Q12H   Continuous Infusions:  sodium chloride     heparin 1,100 Units/hr (07/21/22 1751)   PRN Meds: sodium chloride, acetaminophen, morphine injection, ondansetron (ZOFRAN) IV, oxyCODONE, sodium chloride flush  Allergies:    Allergies  Allergen Reactions   Lisinopril     REACTION: cough   Penicillins     Social History:   Social History   Socioeconomic History   Marital status: Married    Spouse name: Not on file   Number of children: Not on file   Years of education: Not on file   Highest education level: Not on file  Occupational History   Not on file  Tobacco Use   Smoking status: Never   Smokeless tobacco: Never  Substance and Sexual Activity   Alcohol use: Yes    Comment: rare occasional social drinker.   Drug use: Never   Sexual activity: Yes  Other Topics Concern   Not on file  Social  History Narrative   Not on file   Social Determinants of Health   Financial Resource Strain: Not on file  Food Insecurity: Patient Declined (07/21/2022)   Hunger Vital Sign    Worried About Running Out of Food in the Last Year: Patient declined    [place] in the Last Year: Patient declined  Transportation Needs: Patient Declined (07/21/2022)   PRAPARE - Hydrologist (Medical): Patient declined    Lack of Transportation (Non-Medical): Patient declined  Physical Activity: Not on file  Stress: Not on file  Social Connections: Not on file  Intimate Partner Violence: Patient Declined (07/21/2022)   Humiliation, Afraid, Rape,  and Kick questionnaire    Fear of Current or Ex-Partner: Patient declined    Emotionally Abused: Patient declined    Physically Abused: Patient declined    Sexually Abused: Patient declined    Family History:   Father had a MI  ROS:  Please see the history of present illness.   All other ROS reviewed and negative.     Physical Exam/Data:   Vitals:   07/21/22 2352 07/22/22 0137 07/22/22 0510 07/22/22 0630  BP: (!) 150/95 (!) 152/96 (!) 153/100 136/70  Pulse: 61 73 68 (!) 58  Resp:  20 20   Temp:  98.3 F (36.8 C) 98.3 F (36.8 C)   TempSrc:  Oral Oral   SpO2: 99% 99% 97% 97%  Weight:      Height:        Intake/Output Summary (Last 24 hours) at 07/22/2022 0843 Last data filed at 07/21/2022 1931 Gross per 24 hour  Intake --  Output 900 ml  Net -900 ml      07/21/2022    8:56 PM 07/21/2022   11:40 AM 02/17/2022   11:00 AM  Last 3 Weights  Weight (lbs) 186 lb 11.2 oz 188 lb 184 lb  Weight (kg) 84.687 kg 85.276 kg 83.462 kg     Body mass index is 29.24 kg/m.  General:  Well nourished, well developed, in no acute distress HEENT: normal Neck: no JVD Vascular: No carotid bruits; Distal pulses 2+ bilaterally Cardiac:  normal S1, S2; RRR; no murmur  Lungs:  clear to auscultation bilaterally, no wheezing, rhonchi or rales  Abd: soft, nontender, no hepatomegaly  Ext: no edema Musculoskeletal:  No deformities, BUE and BLE strength normal and equal Skin: warm and dry  Neuro:  CNs 2-12 intact, no focal abnormalities noted Psych:  Normal affect   EKG:  The EKG was personally reviewed and demonstrates: Normal sinus rhythm with a heart rate of 62, normal axis, T wave inversion in aVR Telemetry:  Telemetry was personally reviewed and demonstrates: Normal sinus rhythm   Relevant CV Studies:   Laboratory Data:  High Sensitivity Troponin:   Recent Labs  Lab 07/21/22 1523 07/21/22 1730  TROPONINIHS 159* 102*     Chemistry Recent Labs  Lab 07/21/22 1215  07/22/22 0217  NA 137 135  K 4.4 4.4  CL 102 99  CO2 29 24  GLUCOSE 106* 123*  BUN 9 7*  CREATININE 0.97 0.90  CALCIUM 9.7 9.1  MG  --  2.0  GFRNONAA  --  >60  ANIONGAP  --  12    Recent Labs  Lab 07/21/22 1215  PROT 7.3  ALBUMIN 4.5  AST 19  ALT 19  ALKPHOS 75  BILITOT 0.5   Lipids  Recent Labs  Lab 07/21/22 1215  CHOL 225*  TRIG  205.0*  HDL 41.20  CHOLHDL 5    Hematology Recent Labs  Lab 07/21/22 1215 07/22/22 0217  WBC 5.7 9.9  RBC 4.80 5.01  HGB 15.0 15.5  HCT 43.0 44.1  MCV 89.6 88.0  MCH  --  30.9  MCHC 34.9 35.1  RDW 13.9 12.7  PLT 263.0 242   Thyroid  Recent Labs  Lab 07/21/22 1215  TSH 1.21    BNPNo results for input(s): "BNP", "PROBNP" in the last 168 hours.  DDimer  Recent Labs  Lab 07/21/22 1215 07/21/22 1524  DDIMER 0.38 0.29     Radiology/Studies:  CT Angio Chest/Abd/Pel for Dissection W and/or Wo Contrast  Result Date: 07/21/2022 CLINICAL DATA:  Acute aortic syndrome.  Shortness of breath. EXAM: CT ANGIOGRAPHY CHEST, ABDOMEN AND PELVIS TECHNIQUE: Non-contrast CT of the chest was initially obtained. Multidetector CT imaging through the chest, abdomen and pelvis was performed using the standard protocol during bolus administration of intravenous contrast. Multiplanar reconstructed images and MIPs were obtained and reviewed to evaluate the vascular anatomy. RADIATION DOSE REDUCTION: This exam was performed according to the departmental dose-optimization program which includes automated exposure control, adjustment of the mA and/or kV according to patient size and/or use of iterative reconstruction technique. CONTRAST:  127mL OMNIPAQUE IOHEXOL 350 MG/ML SOLN COMPARISON:  Report outside study 04/27/2016. Abdomen pelvis CT 03/03/2021. Chest x-ray earlier 07/21/2022 FINDINGS: CTA CHEST FINDINGS Cardiovascular: Pulsation artifact along the ascending aorta. No mediastinal hematoma. There is mild atherosclerotic plaque identified along the  thoracic aorta. No dissection or aneurysm formation. Diameter of the ascending aorta proximally at its origin has a diameter of 2.5 cm. Ascending aorta at the level of the right pulmonary artery 3.1 x 3.0 cm. Diameter of the aortic arch distally has a measurement of 2.7 cm. The descending thoracic aorta at the level of the right pulmonary artery measures 2.6 x 2.5 cm. The origin of the great vessels is preserved. Mild coronary artery calcifications are seen. The heart is nonenlarged. No pericardial effusion. Mediastinum/Nodes: No specific abnormal lymph node enlargement seen in the axillary region, hilum or mediastinum. No pericardial effusion. Normal caliber thoracic esophagus. Lungs/Pleura: No consolidation, pneumothorax or effusion. There is some basilar atelectasis. 3 mm nodule right lung anteriorly on series 8, image 40. Few other tiny nodules as well similar size. Musculoskeletal: Mild degenerative changes are seen along the spine. Sclerotic focus within the vertebral body at T3, possible bone island. Review of the MIP images confirms the above findings. CTA ABDOMEN AND PELVIS FINDINGS VASCULAR Aorta: Mild atherosclerotic changes along the abdominal aorta. There is a focal area of ectasia of the infrarenal abdominal aorta measuring 2.7 by 1.9 cm. This is at the level of the origin of the IMA. No dissection. Celiac: Patent without evidence of aneurysm, dissection, vasculitis or significant stenosis. SMA: Patent without evidence of aneurysm, dissection, vasculitis or significant stenosis. Renals: Both renal arteries are patent without evidence of aneurysm, dissection, vasculitis, fibromuscular dysplasia or significant stenosis. IMA: Patent without evidence of aneurysm, dissection, vasculitis or significant stenosis. Inflow: Ectatic right common iliac artery measuring up to 20 mm in diameter. The left is also ectatic but to a lesser extent measuring a diameter of 16 mm. Mild plaque. No significant stenosis.  Veins: No obvious venous abnormality within the limitations of this arterial phase study. Retroaortic left renal vein. Review of the MIP images confirms the above findings. NON-VASCULAR Hepatobiliary: With the limits of the early phase of the contrast bolus, the liver is grossly preserved. The gallbladder  is nondilated. Pancreas: Unremarkable. No pancreatic ductal dilatation or surrounding inflammatory changes. Spleen: Normal in size without focal abnormality. Adrenals/Urinary Tract: The right adrenal gland is preserved. The left is slightly thickened and nodular. Unchanged from previous. No collecting system dilatation of either kidney. There are some nonobstructing renal stones as seen previously. Example on the left measures 6 mm and right 2 mm. The ureters have normal course and caliber down to the distended urinary bladder. Stomach/Bowel: There is a large right inguinal hernia involving mesenteric fat and loops of small bowel. No obstruction. Scattered colonic stool. Slightly redundant course to the sigmoid colon. Few colonic diverticula. There is also loops of transverse colon extending between the anterior liver margin in the anterior abdominal wall. Normal appendix. The stomach is moderately distended with debris and fluid. Again no bowel dilatation. Lymphatic: No specific abnormal lymph node enlargement. Reproductive: Enlarged prostate with mass effect along the base of the bladder. Prostate size again proximally 7 x 6 x 5.4 cm. Other: Small fat containing umbilical hernia. No free air or free fluid. Musculoskeletal: Mild degenerative changes are seen along the spine and pelvis. Review of the MIP images confirms the above findings. IMPRESSION: Mild atherosclerotic calcified plaque. No thoracic aortic dissection or aneurysm formation. 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 Recommend follow-up ultrasound every 5 years. This recommendation follows ACR  consensus guidelines: White Paper of the ACR Incidental Findings Committee II on Vascular Findings. J Am Coll Radiol 2013; 10:789-794. Ectatic bilateral common iliac arteries, right-greater-than-left. Bilateral nonobstructing renal stones. Large right inguinal hernia involving small bowel without obstruction. Tiny lung nodules measuring up to 3 mm. No follow-up needed if patient is low-risk (and has no known or suspected primary neoplasm). Non-contrast chest CT can be considered in 12 months if patient is high-risk. This recommendation follows the consensus statement: Guidelines for Management of Incidental Pulmonary Nodules Detected on CT Images: From the Fleischner Society 2017; Radiology 2017; 284:228-243. Enlarged prostate. Please correlate with the history in the patient's PSA Electronically Signed   By: Jill Side M.D.   On: 07/21/2022 17:35   DG Chest Portable 1 View  Result Date: 07/21/2022 CLINICAL DATA:  Shortness of breath EXAM: PORTABLE CHEST 1 VIEW COMPARISON:  None Available. FINDINGS: The heart size and mediastinal contours are within normal limits. Both lungs are clear. The visualized skeletal structures are unremarkable. IMPRESSION: No acute abnormality of the lungs in AP portable projection. Electronically Signed   By: Delanna Ahmadi M.D.   On: 07/21/2022 15:57     Assessment and Plan:   NSTEMI Patient presenting with unstable anginal symptoms with risk factors including family history of cardiac disease, hyperlipidemia, hypertension.  EKG shows nonischemic changes however has had elevated troponins that have down trended from 159-102.  Given RF and concerning HPI, plan for heart catheterization.  Procedure has been explained in great detail. All questions have been answered and patient is agreeable to plan.  Plan for heart catheterization and TTE Continue IV heparin, aspirin 81mg , Troprol XL 50 mg daily,  atorvastatin 80 mg Avoid using nitroglycerin due to hypotensive  episode.  Shared Decision Making/Informed Consent{ The risks [stroke (1 in 1000), death (1 in 1000), kidney failure [usually temporary] (1 in 500), bleeding (1 in 200), allergic reaction [possibly serious] (1 in 200)], benefits (diagnostic support and management of coronary artery disease) and alternatives of a cardiac catheterization were discussed in detail with Mr. Stepka and he is willing  to proceed.   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 Understands a lot of english, but more comfortable with interpreter*   Risk Assessment/Risk Scores:   TIMI Risk Score for Unstable Angina or Non-ST Elevation MI:   The patient's TIMI risk score is 5, which indicates a 26% risk of all cause mortality, new or recurrent myocardial infarction or need for urgent revascularization in the next 14 days.{  For questions or updates, please contact [place] Please consult [email or web address] for contact info under    Signed, Bonnee Quin, PA-C  07/22/2022 8:43 AM  \ Agree with note by Lonzo Cloud, PA-C  Patient admitted with unstable angina.  Positive cardiac risk factors.  Enzymes elevated.  Exam benign.  Currently on IV heparin.  Plan coronary angiography today. The patient understands that risks included but are not limited to stroke (1 in 1000), death (1 in 72), kidney failure [usually temporary] (1 in 500), bleeding (1 in 200), allergic reaction [possibly serious] (1 in 200).  The patient understands and agrees to proceed   Lorretta Harp, M.D., [place], [facility], [place], [place] [address] [place], [place]  [address]  [phone] 07/22/2022 11:00 AM

## 2022-07-22 NOTE — Progress Notes (Signed)
ANTICOAGULATION CONSULT NOTE  Pharmacy Consult for heparin Indication: chest pain/ACS  Allergies  Allergen Reactions   Lisinopril     REACTION: cough   Penicillins    Patient Measurements: Height: 5\' 7"  (170.2 cm) Weight: 84.7 kg (186 lb 11.2 oz) IBW/kg (Calculated) : 66.1 Heparin Dosing Weight: 85.3 kg  Vital Signs: Temp: 98.3 F (36.8 C) (03/27 0137) Temp Source: Oral (03/27 0137) BP: 152/96 (03/27 0137) Pulse Rate: 73 (03/27 0137)  Labs: Recent Labs    07/21/22 1215 07/21/22 1523 07/21/22 1730 07/22/22 0217  HGB 15.0  --   --  15.5  HCT 43.0  --   --  44.1  PLT 263.0  --   --  242  HEPARINUNFRC  --   --   --  0.36  CREATININE 0.97  --   --  0.90  TROPONINIHS  --  159* 102*  --     Estimated Creatinine Clearance: 77.1 mL/min (by C-G formula based on SCr of 0.9 mg/dL).  Medical History: Past Medical History:  Diagnosis Date   Hyperlipidemia    Hypertension    Kidney stone    Retinal detachment     Medications:  Infusions:   sodium chloride     heparin 1,100 Units/hr (07/21/22 1751)   Assessment: AV is a 72 year old male admitted for chest pain and found to have elevated troponin at 159. No anticoagulation prior to admission. Pharmacy consulted to dose heparin for ACS. Hgb 15, plt 263.   3/27 AM update:  Heparin level therapeutic   Goal of Therapy:  Heparin level 0.3-0.7 units/ml Monitor platelets by anticoagulation protocol: Yes   Plan:  Cont heparin at 1100 units/hr Confirmatory heparin level in 6-8 hours  Narda Bonds, PharmD, [place] Pharmacist Phone: [phone]

## 2022-07-22 NOTE — Interval H&P Note (Signed)
History and Physical Interval Note:  07/22/2022 2:21 PM  Bryan Craig  has presented today for surgery, with the diagnosis of NSTEMI.  The various methods of treatment have been discussed with the patient and family. After consideration of risks, benefits and other options for treatment, the patient has consented to  Procedure(s): LEFT HEART CATH AND CORONARY ANGIOGRAPHY (N/A) as a surgical intervention.  The patient's history has been reviewed, patient examined, no change in status, stable for surgery.  I have reviewed the patient's chart and labs.  Questions were answered to the patient's satisfaction.    Cath Lab Visit (complete for each Cath Lab visit)  Clinical Evaluation Leading to the Procedure:   ACS: Yes.    Non-ACS:    Anginal Classification: CCS III  Anti-ischemic medical therapy: Minimal Therapy (1 class of medications)  Non-Invasive Test Results: No non-invasive testing performed  Prior CABG: No previous CABG        Bryan Craig

## 2022-07-22 NOTE — Progress Notes (Signed)
PROGRESS NOTE  Bryan Craig [id number] DOB: 11/03/1950   PCP: Mackie Pai, PA-C  Patient is from: Home.  Lives with his wife.  DOA: 07/21/2022 LOS: 0  Chief complaints Chief Complaint  Patient presents with   Chest Pain     Brief Narrative / Interim history: 72 year old M with PMH of HTN, HLD, psoriasis and nephrolithiasis directed to ED by PCP for elevated troponin after he saw his PCP with typical chest pain.  Reportedly, his troponin was elevated to 59.  In ED, stable vitals.  EKG sinus rhythm.  CXR without acute finding.  CTA chest/abdomen/pelvis showed focal ectasia of infrarenal abdominal aorta, lung nodules and enlarged prostate.  Troponin elevated to 159 and trended down to 102.  Cardiology consulted.  Patient was started on IV heparin and admitted.  SBP dropped to 60s after 1 sublingual nitro but recovered with IV normal saline bolus.  The next day, patient underwent LHC that showed 99% ramus lesion that was treated with DES and other nonobstructing CAD elsewhere.  Cardiology recommended DAPT with aspirin and Brilinta for 1 year, high intensity statin and beta-blocker   Subjective: Seen and examined earlier this morning.  No major events overnight of this morning.  Continues to endorse chest pain on and off.  Rates his pain as mild.  Denies dyspnea, diaphoresis or nausea.  Objective: Vitals:   07/22/22 1519 07/22/22 1524 07/22/22 1529 07/22/22 1548  BP: (!) 139/94 (!) 140/90 (!) 139/93 138/89  Pulse: 67 71 67 73  Resp: (!) 28 (!) 21 (!) 23 16  Temp:      TempSrc:      SpO2: 98% 98%  98%  Weight:      Height:        Examination:  GENERAL: No apparent distress.  Nontoxic. HEENT: MMM.  Vision and hearing grossly intact.  NECK: Supple.  No apparent JVD.  RESP:  No IWOB.  Fair aeration bilaterally. CVS:  RRR. Heart sounds normal.  ABD/GI/GU: BS+. Abd soft, NTND.  MSK/EXT:  Moves extremities. No apparent deformity. No edema.  SKIN: no apparent skin lesion or  wound NEURO: Awake, alert and oriented appropriately.  No apparent focal neuro deficit. PSYCH: Calm. Normal affect.   Procedures:  3/27-LHC that showed 99% ramus lesion that was treated with DES and other nonobstructing CAD elsewhere.  Cardiology recommended DAPT with aspirin and Brilinta for 1 year, high intensity statin and beta-blocker  Microbiology summarized: None  Assessment and plan: Principal Problem:   NSTEMI (non-ST elevated myocardial infarction) ([place]) Active Problems:   Hypertension   Abdominal aortic ectasia (H[facility])   Lung nodules   Enlarged prostate   NSTEMI: Presents with typical chest pain and elevated troponin.  Significant risk factors including HTN, and HLD.  LDL elevated to 131.  A1c 5.9% in 01/2022. -LHC with ramus lesion with 99% stenosis treated with DES. -Appreciate cardiology rec: Aspirin and Brilinta for 1 year, high intensity statin and beta-blocker -Follow TTE and A1c.   Hypotension/history of hypertension: Hypotension after nitro.  Resolved with IV fluid. -Continue beta-blocker and ARB   Hyperlipidemia: LDL 131. -Continue high intensity statin   Ectasia of infrarenal abdominal aorta: Noted on CT. -Outpatient follow-up.  Pulmonary nodules -Outpatient follow-up.  Enlarged prostate -Outpatient follow-up.  Body mass index is 29.24 kg/m.          DVT prophylaxis:  On full dose anticoagulation  Code Status: Full code Family Communication: Updated patient's wife at bedside Level of care: Telemetry Cardiac Status is: Observation The  patient will require care spanning > 2 midnights and should be moved to inpatient because: Non-STEMI   Final disposition: Home Consultants:  Cardiology  35 minutes with more than 50% spent in reviewing records, counseling patient/family and coordinating care.   Sch Meds:  Scheduled Meds:  aspirin EC  81 mg Oral Daily   atorvastatin  80 mg Oral QHS   irbesartan  300 mg Oral Daily   metoprolol succinate   50 mg Oral Daily   sodium chloride flush  3 mL Intravenous Q12H   sodium chloride flush  3 mL Intravenous Q12H   ticagrelor  90 mg Oral BID   Continuous Infusions:  sodium chloride     sodium chloride 75 mL/hr at 07/22/22 1555   sodium chloride     PRN Meds:.sodium chloride, sodium chloride, acetaminophen, hydrALAZINE, labetalol, morphine injection, ondansetron (ZOFRAN) IV, oxyCODONE, sodium chloride flush, sodium chloride flush  Antimicrobials: Anti-infectives (From admission, onward)    None        I have personally reviewed the following labs and images: CBC: Recent Labs  Lab 07/21/22 1215 07/22/22 0217  WBC 5.7 9.9  NEUTROABS 3.5  --   HGB 15.0 15.5  HCT 43.0 44.1  MCV 89.6 88.0  PLT 263.0 242   BMP &GFR Recent Labs  Lab 07/21/22 1215 07/22/22 0217  NA 137 135  K 4.4 4.4  CL 102 99  CO2 29 24  GLUCOSE 106* 123*  BUN 9 7*  CREATININE 0.97 0.90  CALCIUM 9.7 9.1  MG  --  2.0   Estimated Creatinine Clearance: 77.1 mL/min (by C-G formula based on SCr of 0.9 mg/dL). Liver & Pancreas: Recent Labs  Lab 07/21/22 1215  AST 19  ALT 19  ALKPHOS 75  BILITOT 0.5  PROT 7.3  ALBUMIN 4.5   No results for input(s): "LIPASE", "AMYLASE" in the last 168 hours. No results for input(s): "AMMONIA" in the last 168 hours. Diabetic: No results for input(s): "HGBA1C" in the last 72 hours. No results for input(s): "GLUCAP" in the last 168 hours. Cardiac Enzymes: No results for input(s): "CKTOTAL", "CKMB", "CKMBINDEX", "TROPONINI" in the last 168 hours. No results for input(s): "PROBNP" in the last 8760 hours. Coagulation Profile: No results for input(s): "INR", "PROTIME" in the last 168 hours. Thyroid Function Tests: Recent Labs    07/21/22 1215  TSH 1.21   Lipid Profile: Recent Labs    07/21/22 1215  CHOL 225*  HDL 41.20  TRIG 205.0*  CHOLHDL 5  LDLDIRECT 131.0   Anemia Panel: No results for input(s): "VITAMINB12", "FOLATE", "FERRITIN", "TIBC", "IRON",  "RETICCTPCT" in the last 72 hours. Urine analysis:    Component Value Date/Time   COLORURINE YELLOW 02/13/2022 [place] 02/13/2022 0948   LABSPEC 1.025 02/13/2022 0948   PHURINE 7.0 02/13/2022 0948   GLUCOSEU NEGATIVE 02/13/2022 0948   HGBUR NEGATIVE 02/13/2022 0948   BILIRUBINUR NEGATIVE 02/13/2022 0948   KETONESUR NEGATIVE 02/13/2022 0948   PROTEINUR NEGATIVE 02/13/2022 0948   UROBILINOGEN 0.2 04/06/2014 0410   NITRITE NEGATIVE 02/13/2022 0948   LEUKOCYTESUR NEGATIVE 02/13/2022 0948   Sepsis Labs: Invalid input(s): "PROCALCITONIN", "LACTICIDVEN"  Microbiology: No results found for this or any previous visit (from the past 240 hour(s)).  Radiology Studies: CT Angio Chest/Abd/Pel for Dissection W and/or Wo Contrast  Result Date: 07/21/2022 CLINICAL DATA:  Acute aortic syndrome.  Shortness of breath. EXAM: CT ANGIOGRAPHY CHEST, ABDOMEN AND PELVIS TECHNIQUE: Non-contrast CT of the chest was initially obtained. Multidetector CT imaging through  the chest, abdomen and pelvis was performed using the standard protocol during bolus administration of intravenous contrast. Multiplanar reconstructed images and MIPs were obtained and reviewed to evaluate the vascular anatomy. RADIATION DOSE REDUCTION: This exam was performed according to the departmental dose-optimization program which includes automated exposure control, adjustment of the mA and/or kV according to patient size and/or use of iterative reconstruction technique. CONTRAST:  18mL OMNIPAQUE IOHEXOL 350 MG/ML SOLN COMPARISON:  Report outside study 04/27/2016. Abdomen pelvis CT 03/03/2021. Chest x-ray earlier 07/21/2022 FINDINGS: CTA CHEST FINDINGS Cardiovascular: Pulsation artifact along the ascending aorta. No mediastinal hematoma. There is mild atherosclerotic plaque identified along the thoracic aorta. No dissection or aneurysm formation. Diameter of the ascending aorta proximally at its origin has a diameter of 2.5 cm.  Ascending aorta at the level of the right pulmonary artery 3.1 x 3.0 cm. Diameter of the aortic arch distally has a measurement of 2.7 cm. The descending thoracic aorta at the level of the right pulmonary artery measures 2.6 x 2.5 cm. The origin of the great vessels is preserved. Mild coronary artery calcifications are seen. The heart is nonenlarged. No pericardial effusion. Mediastinum/Nodes: No specific abnormal lymph node enlargement seen in the axillary region, hilum or mediastinum. No pericardial effusion. Normal caliber thoracic esophagus. Lungs/Pleura: No consolidation, pneumothorax or effusion. There is some basilar atelectasis. 3 mm nodule right lung anteriorly on series 8, image 40. Few other tiny nodules as well similar size. Musculoskeletal: Mild degenerative changes are seen along the spine. Sclerotic focus within the vertebral body at T3, possible bone island. Review of the MIP images confirms the above findings. CTA ABDOMEN AND PELVIS FINDINGS VASCULAR Aorta: Mild atherosclerotic changes along the abdominal aorta. There is a focal area of ectasia of the infrarenal abdominal aorta measuring 2.7 by 1.9 cm. This is at the level of the origin of the IMA. No dissection. Celiac: Patent without evidence of aneurysm, dissection, vasculitis or significant stenosis. SMA: Patent without evidence of aneurysm, dissection, vasculitis or significant stenosis. Renals: Both renal arteries are patent without evidence of aneurysm, dissection, vasculitis, fibromuscular dysplasia or significant stenosis. IMA: Patent without evidence of aneurysm, dissection, vasculitis or significant stenosis. Inflow: Ectatic right common iliac artery measuring up to 20 mm in diameter. The left is also ectatic but to a lesser extent measuring a diameter of 16 mm. Mild plaque. No significant stenosis. Veins: No obvious venous abnormality within the limitations of this arterial phase study. Retroaortic left renal vein. Review of the MIP  images confirms the above findings. NON-VASCULAR Hepatobiliary: With the limits of the early phase of the contrast bolus, the liver is grossly preserved. The gallbladder is nondilated. Pancreas: Unremarkable. No pancreatic ductal dilatation or surrounding inflammatory changes. Spleen: Normal in size without focal abnormality. Adrenals/Urinary Tract: The right adrenal gland is preserved. The left is slightly thickened and nodular. Unchanged from previous. No collecting system dilatation of either kidney. There are some nonobstructing renal stones as seen previously. Example on the left measures 6 mm and right 2 mm. The ureters have normal course and caliber down to the distended urinary bladder. Stomach/Bowel: There is a large right inguinal hernia involving mesenteric fat and loops of small bowel. No obstruction. Scattered colonic stool. Slightly redundant course to the sigmoid colon. Few colonic diverticula. There is also loops of transverse colon extending between the anterior liver margin in the anterior abdominal wall. Normal appendix. The stomach is moderately distended with debris and fluid. Again no bowel dilatation. Lymphatic: No specific abnormal lymph node enlargement.  Reproductive: Enlarged prostate with mass effect along the base of the bladder. Prostate size again proximally 7 x 6 x 5.4 cm. Other: Small fat containing umbilical hernia. No free air or free fluid. Musculoskeletal: Mild degenerative changes are seen along the spine and pelvis. Review of the MIP images confirms the above findings. IMPRESSION: Mild atherosclerotic calcified plaque. No thoracic aortic dissection or aneurysm formation. 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 Recommend follow-up ultrasound every 5 years. This recommendation follows ACR consensus guidelines: White Paper of the ACR Incidental Findings Committee II on Vascular Findings. J Am Coll Radiol 2013; 10:789-794.  Ectatic bilateral common iliac arteries, right-greater-than-left. Bilateral nonobstructing renal stones. Large right inguinal hernia involving small bowel without obstruction. Tiny lung nodules measuring up to 3 mm. No follow-up needed if patient is low-risk (and has no known or suspected primary neoplasm). Non-contrast chest CT can be considered in 12 months if patient is high-risk. This recommendation follows the consensus statement: Guidelines for Management of Incidental Pulmonary Nodules Detected on CT Images: From the Fleischner Society 2017; Radiology 2017; 284:228-243. Enlarged prostate. Please correlate with the history in the patient's PSA Electronically Signed   By: Jill Side M.D.   On: 07/21/2022 17:35      Axie Hayne T. [employer]  If 7PM-7AM, please contact night-coverage [email or web address] 07/22/2022, 3:56 PM

## 2022-07-22 NOTE — TOC Progression Note (Signed)
Transition of Care [facility]) - Progression Note    Patient Details  Name: Bryan Craig MRN: [id number] Date of Birth: 1951-02-20  Transition of [facility] [place]) CM/SW Contact  Zenon Mayo, RN Phone Number: 07/22/2022, 11:41 AM  Clinical Narrative:    From home , NSTEMI, on hep drip, may have a poss cath, Cards to follow.  TOC following.        Expected Discharge Plan and Services                                               Social Determinants of Health (SDOH) Interventions SDOH Screenings   Food Insecurity: Patient Declined (07/21/2022)  Housing: Low Risk  (07/21/2022)  Transportation Needs: Patient Declined (07/21/2022)  Utilities: Patient Declined (07/21/2022)  Depression (PHQ2-9): Low Risk  (11/03/2021)  Tobacco Use: Low Risk  (07/21/2022)    Readmission Risk Interventions     No data to display

## 2022-07-22 NOTE — Plan of Care (Signed)

## 2022-07-22 NOTE — Assessment & Plan Note (Signed)
EKG nsr No symptoms while in office Check labs  If cp returns -- go to Er

## 2022-07-23 ENCOUNTER — Inpatient Hospital Stay (HOSPITAL_COMMUNITY): Payer: Medicare Other

## 2022-07-23 ENCOUNTER — Other Ambulatory Visit (HOSPITAL_COMMUNITY): Payer: Self-pay

## 2022-07-23 ENCOUNTER — Encounter (HOSPITAL_COMMUNITY): Payer: Self-pay | Admitting: Cardiovascular Disease

## 2022-07-23 ENCOUNTER — Telehealth: Payer: Self-pay

## 2022-07-23 DIAGNOSIS — N400 Enlarged prostate without lower urinary tract symptoms: Secondary | ICD-10-CM | POA: Diagnosis not present

## 2022-07-23 DIAGNOSIS — R918 Other nonspecific abnormal finding of lung field: Secondary | ICD-10-CM | POA: Diagnosis not present

## 2022-07-23 DIAGNOSIS — I10 Essential (primary) hypertension: Secondary | ICD-10-CM | POA: Diagnosis not present

## 2022-07-23 DIAGNOSIS — I214 Non-ST elevation (NSTEMI) myocardial infarction: Secondary | ICD-10-CM | POA: Diagnosis not present

## 2022-07-23 LAB — LIPOPROTEIN A (LPA): Lipoprotein (a): 256.8 nmol/L — ABNORMAL HIGH (ref <75.0)

## 2022-07-23 LAB — ECHOCARDIOGRAM COMPLETE
AR max vel: 3.19 cm2
AV Area VTI: 3.12 cm2
AV Area mean vel: 2.97 cm2
AV Mean grad: 2.9 mmHg
AV Peak grad: 5.4 mmHg
Ao pk vel: 1.16 m/s
Area-P 1/2: 4.68 cm2
Calc EF: 52.3 %
Height: 67 in
S' Lateral: 2.9 cm
Single Plane A2C EF: 52.1 %
Single Plane A4C EF: 53.4 %
Weight: 2836.8 oz

## 2022-07-23 LAB — RENAL FUNCTION PANEL
Albumin: 3.4 g/dL — ABNORMAL LOW (ref 3.5–5.0)
Anion gap: 8 (ref 5–15)
BUN: 9 mg/dL (ref 8–23)
CO2: 25 mmol/L (ref 22–32)
Calcium: 8.9 mg/dL (ref 8.9–10.3)
Chloride: 101 mmol/L (ref 98–111)
Creatinine, Ser: 0.89 mg/dL (ref 0.61–1.24)
GFR, Estimated: >60 mL/min (ref >60)
Glucose, Bld: 112 mg/dL — ABNORMAL HIGH (ref 70–99)
Phosphorus: 2.7 mg/dL (ref 2.5–4.6)
Potassium: 3.3 mmol/L — ABNORMAL LOW (ref 3.5–5.1)
Sodium: 134 mmol/L — ABNORMAL LOW (ref 135–145)

## 2022-07-23 LAB — CBC
HCT: 42.8 % (ref 39.0–52.0)
Hemoglobin: 15.3 g/dL (ref 13.0–17.0)
MCH: 30.9 pg (ref 26.0–34.0)
MCHC: 35.7 g/dL (ref 30.0–36.0)
MCV: 86.5 fL (ref 80.0–100.0)
Platelets: 231 10*3/uL (ref 150–400)
RBC: 4.95 MIL/uL (ref 4.22–5.81)
RDW: 13 % (ref 11.5–15.5)
WBC: 8.9 10*3/uL (ref 4.0–10.5)
nRBC: 0 % (ref 0.0–0.2)

## 2022-07-23 LAB — MAGNESIUM: Magnesium: 2 mg/dL (ref 1.7–2.4)

## 2022-07-23 MED ORDER — ASPIRIN 81 MG PO TBEC
81.0000 mg | DELAYED_RELEASE_TABLET | Freq: Every day | ORAL | 3 refills | Status: AC
  Filled 2022-07-23 – 2022-10-18 (×2): qty 90, 90d supply, fill #0
  Filled 2023-01-15: qty 90, 90d supply, fill #1
  Filled 2023-04-14 – 2023-04-15 (×2): qty 90, 90d supply, fill #2

## 2022-07-23 MED ORDER — TICAGRELOR 90 MG PO TABS
90.0000 mg | ORAL_TABLET | Freq: Two times a day (BID) | ORAL | 3 refills | Status: DC
  Filled 2022-07-23 – 2022-10-18 (×2): qty 180, 90d supply, fill #0
  Filled 2023-01-15: qty 180, 90d supply, fill #1

## 2022-07-23 MED ORDER — ATORVASTATIN CALCIUM 80 MG PO TABS
80.0000 mg | ORAL_TABLET | Freq: Every day | ORAL | 1 refills | Status: DC
  Filled 2022-07-23 – 2022-10-18 (×2): qty 90, 90d supply, fill #0

## 2022-07-23 MED ORDER — POTASSIUM CHLORIDE 20 MEQ PO PACK
40.0000 meq | PACK | ORAL | Status: DC
  Administered 2022-07-23: 40 meq via ORAL
  Filled 2022-07-23: qty 2

## 2022-07-23 NOTE — TOC Benefit Eligibility Note (Signed)
Patient Teacher, English as a foreign language completed.    The patient is currently admitted and upon discharge could be taking Brilinta 90 mg.  The current 30 day co-pay is $0.00.   The patient is insured through [employer] Part D   This test claim was processed through [place] amounts may vary at other pharmacies due to pharmacy/plan contracts, or as the patient moves through the different stages of their insurance plan.  Lyndel Safe, [place] Patient Advocate Specialist [place] Patient Advocate Team Direct Number: [phone]  Fax: [phone]

## 2022-07-23 NOTE — Telephone Encounter (Signed)
-----   Message from Darreld Mclean, [place] sent at 07/23/2022 11:35 AM EDT ----- Regarding: Hospital F/U Patient was discharged from the hospital today after a NSTEMI. Per Dr. Gwenlyn Found, he needs a TOC visit in about 1 week with an APP and then follow-up with him in 4-6 weeks. I am sorry I was unable to arrange this before he was discharged by the Internal Medicine team. Can you please call patient and help arrange this?  Thank you so much! Callie

## 2022-07-23 NOTE — Progress Notes (Signed)
Echocardiogram 2D Echocardiogram has been performed.  Bryan Craig 07/23/2022, 10:20 AM

## 2022-07-23 NOTE — Progress Notes (Signed)
CARDIAC REHAB PHASE I   PRE:  Rate/Rhythm: 81 SR   BP:  Supine: 106/73   SaO2: 96 RA  MODE:  Ambulation: 470 ft   POST:  Rate/Rhythm: 92 SR  BP:  Sitting: 115/85   SaO2: 98 RA  Pt received in bed with daughter and wife present in room. Pt amb in hallway independently and tolerated well with no s/sx. Pt returned to room and educated on risk factors, exercise guidelines, angina/NTG, stent, MI booklet, restrictions, nutrition, and CRP2. CRP2 referral to be placed to [facility]. Pt and family asked several appropriate questions. Pt left in bed with call bell in reach.   Pt and wife had concerns regarding nitroglycerin use since pt BP dropped after receiving it in ED.   [place], [place] 07/23/2022 9:33 AM

## 2022-07-23 NOTE — TOC Transition Note (Signed)
Transition of Care [facility]) - CM/SW Discharge Note   Patient Details  Name: Bryan Craig MRN: [id number] Date of Birth: 1950/09/22  Transition of [facility]) CM/SW Contact:  Verdell Carmine, RN Phone Number: 07/23/2022, 9:57 AM   Clinical Narrative:    Patient had Cath one DES. Will be on Brinlinta. Eligibility done, zero co-pay.  No further needs identified.    Final next level of care: Home/Self Care Barriers to Discharge: No Barriers Identified   Patient Goals and CMS Choice      Discharge Placement    Home self care                     Discharge Plan and Services Additional resources added to the After Visit Summary for                                       Social Determinants of Health (SDOH) Interventions SDOH Screenings   Food Insecurity: Patient Declined (07/21/2022)  Housing: Low Risk  (07/21/2022)  Transportation Needs: Patient Declined (07/21/2022)  Utilities: Patient Declined (07/21/2022)  Depression (PHQ2-9): Low Risk  (11/03/2021)  Tobacco Use: Low Risk  (07/23/2022)     Readmission Risk Interventions     No data to display

## 2022-07-23 NOTE — Discharge Summary (Signed)
Physician Discharge Summary  Bryan Craig [id number] DOB: Sep 11, 1950 DOA: 07/21/2022  PCP: Mackie Pai, PA-C  Admit date: 07/21/2022 Discharge date: 07/23/2022 Admitted From: Home Disposition: Home Recommendations for Outpatient Follow-up:  Follow up with PCP in 1 week Cardiology to arrange outpatient follow-up Check BP, BMP and CBC at follow-up Please follow up on the following pending results: Echocardiogram  Home Health: Not indicated Equipment/Devices: Not indicated  Discharge Condition: Stable CODE STATUS: Full code  Follow-up Information     Saguier, Iris Pert. Schedule an appointment as soon as possible for a visit in 1 week(s).   Specialties: Internal Medicine, Family Medicine Contact information: [address] [place] [address] [facility] course 72 year old M with PMH of HTN, HLD, psoriasis and nephrolithiasis directed to ED by PCP for elevated troponin after he saw his PCP with typical chest pain.  Reportedly, his troponin was elevated to 59.  In ED, stable vitals.  EKG sinus rhythm.  CXR without acute finding.  CTA chest/abdomen/pelvis showed focal ectasia of infrarenal abdominal aorta, lung nodules and enlarged prostate.  Troponin elevated to 159 and trended down to 102.  Cardiology consulted.  Patient was started on IV heparin and admitted.  SBP dropped to 60s after 1 sublingual nitro but recovered with IV normal saline bolus.   The next day, patient underwent LHC that showed 99% ramus lesion that was treated with DES and other nonobstructing CAD elsewhere.  Cardiology recommended DAPT with aspirin and Brilinta for 1 year and high intensity statin.  Patient will continue Coreg and losartan.   See individual problem list below for more.   Problems addressed during this hospitalization Principal Problem:   NSTEMI (non-ST elevated myocardial infarction) [facility]) Active Problems:   Hypertension    Abdominal aortic ectasia (HCC)   Lung nodules   Enlarged prostate   NSTEMI: Presents with typical chest pain and elevated troponin.  Significant risk factors including HTN, and HLD.  LDL elevated to 131.  A1c 5.8%. -LHC with ramus lesion with 99% stenosis treated with DES. -Appreciate cardiology rec: Aspirin and Brilinta for 1 year, high intensity statin and beta-blocker -Cardiology to arrange outpatient follow-up.   Hypotension/history of hypertension: Hypotension after nitro resolved. -Continue Coreg and Micardis.   Hyperlipidemia: LDL 131. -Continue high intensity statin   Ectasia of infrarenal abdominal aorta: Noted on CT. -Outpatient follow-up.   Pulmonary nodules -Outpatient follow-up.   Enlarged prostate -Outpatient follow-up.           Time spent 40  Vital signs Vitals:   07/22/22 2042 07/23/22 0449 07/23/22 0733 07/23/22 0753  BP: 117/86 114/76 106/73   Pulse: 70 84 96 87  Temp: 97.8 F (36.6 C) 98.2 F (36.8 C) 97.9 F (36.6 C)   Resp: 17 18 18 18   Height:      Weight:  80.4 kg    SpO2: 96% 95% 97% 97%  TempSrc: Oral Oral Oral   BMI (Calculated):  27.76       Discharge exam  GENERAL: No apparent distress.  Nontoxic. HEENT: MMM.  Vision and hearing grossly intact.  NECK: Supple.  No apparent JVD.  RESP:  No IWOB.  Fair aeration bilaterally. CVS:  RRR. Heart sounds normal.  ABD/GI/GU: BS+. Abd soft, NTND.  MSK/EXT:  Moves extremities. No apparent deformity. No edema.  SKIN: no apparent skin lesion or wound NEURO: Awake and alert. Oriented appropriately.  No apparent focal neuro deficit. PSYCH: Calm. Normal affect.   Discharge Instructions Discharge Instructions     Amb Referral to Cardiac Rehabilitation   Complete by: As directed    Diagnosis:  Coronary Stents NSTEMI     After initial evaluation and assessments completed: Virtual Based Care may be provided alone or in conjunction with Phase 2 Cardiac Rehab based on patient barriers.: Yes    Intensive Cardiac Rehabilitation (ICR) [place] location only OR Traditional Cardiac Rehabilitation (TCR) *If criteria for ICR are not met will enroll in TCR [facility] only): Yes   Call MD for:  difficulty breathing, headache or visual disturbances   Complete by: As directed    Call MD for:  extreme fatigue   Complete by: As directed    Call MD for:  persistant dizziness or light-headedness   Complete by: As directed    Call MD for:  severe uncontrolled pain   Complete by: As directed    Diet - low sodium heart healthy   Complete by: As directed    Discharge instructions   Complete by: As directed    It has been a pleasure taking care of you!  You were hospitalized due to chest pain for which you have been treated with stent placement.  We are discharging you on medications to reduce your risk of further heart attack.  Please take your medications as prescribed.  Review your new medication list and the directions on your medications before you take them.  Avoid any over-the-counter pain medication other than plain Tylenol.    Take care,   Increase activity slowly   Complete by: As directed       Allergies as of 07/23/2022       Reactions   Lisinopril    REACTION: cough   Penicillins         Medication List     STOP taking these medications    hydrochlorothiazide 25 MG tablet Commonly known as: HYDRODIURIL       TAKE these medications    Aspirin Low Dose 81 MG tablet Generic drug: aspirin EC Take 1 tablet (81 mg total) by mouth daily. Swallow whole.   atorvastatin 80 MG tablet Commonly known as: LIPITOR Tome 1 tableta (80 mg en total) por va oral antes de acostarse. (Take 1 tablet (80 mg total) by mouth at bedtime.) What changed:  medication strength how much to take when to take this   Brilinta 90 MG Tabs tablet Generic drug: ticagrelor Take 1 tablet (90 mg total) by mouth 2 (two) times daily.   Difluprednate 0.05 % Emul Administer 1 drop into the left eye  daily.   metoprolol succinate 50 MG 24 hr tablet Commonly known as: TOPROL-XL Take 1 tablet (50 mg total) by mouth daily.   Skyrizi 150 MG/ML Sosy Generic drug: Risankizumab-rzaa Inject into the skin once a week.   telmisartan 80 MG tablet Commonly known as: MICARDIS Take 1 tablet (80 mg total) by mouth daily.   triamcinolone cream 0.1 % Commonly known as: KENALOG Apply 1 Application topically 2 (two) times daily. What changed:  when to take this reasons to take this        Consultations: Cardiology  Procedures/Studies:   CARDIAC CATHETERIZATION  Result Date: 07/22/2022   Prox RCA lesion is 30% stenosed.   Mid RCA lesion is 30% stenosed.   Prox Cx to Mid Cx lesion is 30% stenosed.   Ost Cx to Prox Cx lesion is 30% stenosed.  Prox LAD to Mid LAD lesion is 40% stenosed.   Ramus lesion is 99% stenosed.   A drug-eluting stent was successfully placed using a SYNERGY XD [id number].   Post intervention, there is a 0% residual stenosis. NSTEMI secondary to thrombotic sub-total occlusion of the intermediate branch Successful PTCA/DES x 1 intermediate branch The LAD is a large vessel that courses to the apex. There is mild stenosis in the mid LAD. This is not flow limiting The Circumflex is a large caliber vessel with mild proximal and mid stenosis The RCA is a large dominant vessel with mild proximal and mid stenosis. LVEDP=8 mmHg Recommendations: Will continue DAPT with ASA and Brilinta for one year. Continue high intensity statin and beta blocker.   CT Angio Chest/Abd/Pel for Dissection W and/or Wo Contrast  Result Date: 07/21/2022 CLINICAL DATA:  Acute aortic syndrome.  Shortness of breath. EXAM: CT ANGIOGRAPHY CHEST, ABDOMEN AND PELVIS TECHNIQUE: Non-contrast CT of the chest was initially obtained. Multidetector CT imaging through the chest, abdomen and pelvis was performed using the standard protocol during bolus administration of intravenous contrast. Multiplanar reconstructed images  and MIPs were obtained and reviewed to evaluate the vascular anatomy. RADIATION DOSE REDUCTION: This exam was performed according to the departmental dose-optimization program which includes automated exposure control, adjustment of the mA and/or kV according to patient size and/or use of iterative reconstruction technique. CONTRAST:  171mL OMNIPAQUE IOHEXOL 350 MG/ML SOLN COMPARISON:  Report outside study 04/27/2016. Abdomen pelvis CT 03/03/2021. Chest x-ray earlier 07/21/2022 FINDINGS: CTA CHEST FINDINGS Cardiovascular: Pulsation artifact along the ascending aorta. No mediastinal hematoma. There is mild atherosclerotic plaque identified along the thoracic aorta. No dissection or aneurysm formation. Diameter of the ascending aorta proximally at its origin has a diameter of 2.5 cm. Ascending aorta at the level of the right pulmonary artery 3.1 x 3.0 cm. Diameter of the aortic arch distally has a measurement of 2.7 cm. The descending thoracic aorta at the level of the right pulmonary artery measures 2.6 x 2.5 cm. The origin of the great vessels is preserved. Mild coronary artery calcifications are seen. The heart is nonenlarged. No pericardial effusion. Mediastinum/Nodes: No specific abnormal lymph node enlargement seen in the axillary region, hilum or mediastinum. No pericardial effusion. Normal caliber thoracic esophagus. Lungs/Pleura: No consolidation, pneumothorax or effusion. There is some basilar atelectasis. 3 mm nodule right lung anteriorly on series 8, image 40. Few other tiny nodules as well similar size. Musculoskeletal: Mild degenerative changes are seen along the spine. Sclerotic focus within the vertebral body at T3, possible bone island. Review of the MIP images confirms the above findings. CTA ABDOMEN AND PELVIS FINDINGS VASCULAR Aorta: Mild atherosclerotic changes along the abdominal aorta. There is a focal area of ectasia of the infrarenal abdominal aorta measuring 2.7 by 1.9 cm. This is at the  level of the origin of the IMA. No dissection. Celiac: Patent without evidence of aneurysm, dissection, vasculitis or significant stenosis. SMA: Patent without evidence of aneurysm, dissection, vasculitis or significant stenosis. Renals: Both renal arteries are patent without evidence of aneurysm, dissection, vasculitis, fibromuscular dysplasia or significant stenosis. IMA: Patent without evidence of aneurysm, dissection, vasculitis or significant stenosis. Inflow: Ectatic right common iliac artery measuring up to 20 mm in diameter. The left is also ectatic but to a lesser extent measuring a diameter of 16 mm. Mild plaque. No significant stenosis. Veins: No obvious venous abnormality within the limitations of this arterial phase study. Retroaortic left renal vein. Review of the MIP images confirms the  above findings. NON-VASCULAR Hepatobiliary: With the limits of the early phase of the contrast bolus, the liver is grossly preserved. The gallbladder is nondilated. Pancreas: Unremarkable. No pancreatic ductal dilatation or surrounding inflammatory changes. Spleen: Normal in size without focal abnormality. Adrenals/Urinary Tract: The right adrenal gland is preserved. The left is slightly thickened and nodular. Unchanged from previous. No collecting system dilatation of either kidney. There are some nonobstructing renal stones as seen previously. Example on the left measures 6 mm and right 2 mm. The ureters have normal course and caliber down to the distended urinary bladder. Stomach/Bowel: There is a large right inguinal hernia involving mesenteric fat and loops of small bowel. No obstruction. Scattered colonic stool. Slightly redundant course to the sigmoid colon. Few colonic diverticula. There is also loops of transverse colon extending between the anterior liver margin in the anterior abdominal wall. Normal appendix. The stomach is moderately distended with debris and fluid. Again no bowel dilatation. Lymphatic: No  specific abnormal lymph node enlargement. Reproductive: Enlarged prostate with mass effect along the base of the bladder. Prostate size again proximally 7 x 6 x 5.4 cm. Other: Small fat containing umbilical hernia. No free air or free fluid. Musculoskeletal: Mild degenerative changes are seen along the spine and pelvis. Review of the MIP images confirms the above findings. IMPRESSION: Mild atherosclerotic calcified plaque. No thoracic aortic dissection or aneurysm formation. 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 Recommend follow-up ultrasound every 5 years. This recommendation follows ACR consensus guidelines: White Paper of the ACR Incidental Findings Committee II on Vascular Findings. J Am Coll Radiol 2013; 10:789-794. Ectatic bilateral common iliac arteries, right-greater-than-left. Bilateral nonobstructing renal stones. Large right inguinal hernia involving small bowel without obstruction. Tiny lung nodules measuring up to 3 mm. No follow-up needed if patient is low-risk (and has no known or suspected primary neoplasm). Non-contrast chest CT can be considered in 12 months if patient is high-risk. This recommendation follows the consensus statement: Guidelines for Management of Incidental Pulmonary Nodules Detected on CT Images: From the Fleischner Society 2017; Radiology 2017; 284:228-243. Enlarged prostate. Please correlate with the history in the patient's PSA Electronically Signed   By: Jill Side M.D.   On: 07/21/2022 17:35   DG Chest Portable 1 View  Result Date: 07/21/2022 CLINICAL DATA:  Shortness of breath EXAM: PORTABLE CHEST 1 VIEW COMPARISON:  None Available. FINDINGS: The heart size and mediastinal contours are within normal limits. Both lungs are clear. The visualized skeletal structures are unremarkable. IMPRESSION: No acute abnormality of the lungs in AP portable projection. Electronically Signed   By: Delanna Ahmadi M.D.   On:  07/21/2022 15:57       The results of significant diagnostics from this hospitalization (including imaging, microbiology, ancillary and laboratory) are listed below for reference.     Microbiology: No results found for this or any previous visit (from the past 240 hour(s)).   Labs:  CBC: Recent Labs  Lab 07/21/22 1215 07/22/22 0217 07/23/22 0147  WBC 5.7 9.9 8.9  NEUTROABS 3.5  --   --   HGB 15.0 15.5 15.3  HCT 43.0 44.1 42.8  MCV 89.6 88.0 86.5  PLT 263.0 242 231   BMP &GFR Recent Labs  Lab 07/21/22 1215 07/22/22 0217 07/23/22 0147  NA 137 135 134*  K 4.4 4.4 3.3*  CL 102 99 101  CO2 29 24 25   GLUCOSE 106* 123* 112*  BUN 9 7* 9  CREATININE 0.97 0.90 0.89  CALCIUM 9.7 9.1 8.9  MG  --  2.0 2.0  PHOS  --   --  2.7   Estimated Creatinine Clearance: 76.2 mL/min (by C-G formula based on SCr of 0.89 mg/dL). Liver & Pancreas: Recent Labs  Lab 07/21/22 1215 07/23/22 0147  AST 19  --   ALT 19  --   ALKPHOS 75  --   BILITOT 0.5  --   PROT 7.3  --   ALBUMIN 4.5 3.4*   No results for input(s): "LIPASE", "AMYLASE" in the last 168 hours. No results for input(s): "AMMONIA" in the last 168 hours. Diabetic: Recent Labs    07/22/22 0217  HGBA1C 5.8*   No results for input(s): "GLUCAP" in the last 168 hours. Cardiac Enzymes: No results for input(s): "CKTOTAL", "CKMB", "CKMBINDEX", "TROPONINI" in the last 168 hours. No results for input(s): "PROBNP" in the last 8760 hours. Coagulation Profile: No results for input(s): "INR", "PROTIME" in the last 168 hours. Thyroid Function Tests: Recent Labs    07/21/22 1215  TSH 1.21   Lipid Profile: Recent Labs    07/21/22 1215  CHOL 225*  HDL 41.20  TRIG 205.0*  CHOLHDL 5  LDLDIRECT 131.0   Anemia Panel: No results for input(s): "VITAMINB12", "FOLATE", "FERRITIN", "TIBC", "IRON", "RETICCTPCT" in the last 72 hours. Urine analysis:    Component Value Date/Time   COLORURINE YELLOW 02/13/2022 [place] 02/13/2022 0948   LABSPEC 1.025 02/13/2022 0948   PHURINE 7.0 02/13/2022 0948   GLUCOSEU NEGATIVE 02/13/2022 0948   HGBUR NEGATIVE 02/13/2022 0948   BILIRUBINUR NEGATIVE 02/13/2022 0948   KETONESUR NEGATIVE 02/13/2022 0948   PROTEINUR NEGATIVE 02/13/2022 0948   UROBILINOGEN 0.2 04/06/2014 0410   NITRITE NEGATIVE 02/13/2022 0948   LEUKOCYTESUR NEGATIVE 02/13/2022 0948   Sepsis Labs: Invalid input(s): "PROCALCITONIN", "LACTICIDVEN"   SIGNED:  Mercy Riding, MD  [facility] 07/23/2022, 2:29 PM

## 2022-07-23 NOTE — Progress Notes (Signed)
Discharge instructions reviewed with pt, wife, and daughter. Handout on radial care post cath reviewed and given to pt. Questions answered.  Copy of instructions given to pt/wife/daughter.  Scripts filled by [facility] and pt will be taken by the pharmacy to pick up meds on way to main entrance for discharge.  Pt to be d/c'd via wheelchair with belongings, with family.             To be escorted by hospital volunteer.

## 2022-07-23 NOTE — Progress Notes (Signed)
Rounding Note    Patient Name: Bryan Craig Date of Encounter: 07/23/2022  [facility] Cardiologist: Dr. Quay Burow  Subjective   Postop day 1 ramus intermedius branch PCI drug-eluting stent by Dr. Angelena Form.  Patient is asymptomatic this morning.  Inpatient Medications    Scheduled Meds:  aspirin EC  81 mg Oral Daily   atorvastatin  80 mg Oral QHS   irbesartan  300 mg Oral Daily   metoprolol succinate  50 mg Oral Daily   potassium chloride  40 mEq Oral Q4H   sodium chloride flush  3 mL Intravenous Q12H   sodium chloride flush  3 mL Intravenous Q12H   ticagrelor  90 mg Oral BID   Continuous Infusions:  sodium chloride     sodium chloride     PRN Meds: sodium chloride, sodium chloride, acetaminophen, morphine injection, ondansetron (ZOFRAN) IV, oxyCODONE, sodium chloride flush, sodium chloride flush   Vital Signs    Vitals:   07/22/22 2042 07/23/22 0449 07/23/22 0733 07/23/22 0753  BP: 117/86 114/76 106/73   Pulse: 70 84 96 87  Resp: 17 18 18 18   Temp: 97.8 F (36.6 C) 98.2 F (36.8 C) 97.9 F (36.6 C)   TempSrc: Oral Oral Oral   SpO2: 96% 95% 97% 97%  Weight:  80.4 kg    Height:        Intake/Output Summary (Last 24 hours) at 07/23/2022 0858 Last data filed at 07/22/2022 1609 Gross per 24 hour  Intake 1023.92 ml  Output --  Net 1023.92 ml      07/23/2022    4:49 AM 07/21/2022    8:56 PM 07/21/2022   11:40 AM  Last 3 Weights  Weight (lbs) 177 lb 4.8 oz 186 lb 11.2 oz 188 lb  Weight (kg) 80.423 kg 84.687 kg 85.276 kg      Telemetry    Sinus rhythm- Personally Reviewed  ECG    Normal sinus rhythm at 77 with lateral T wave inversion in 1 and L and early R wave transition.- Personally Reviewed  Physical Exam   GEN: No acute distress.   Neck: No JVD Cardiac: RRR, no murmurs, rubs, or gallops.  Respiratory: Clear to auscultation bilaterally. GI: Soft, nontender, non-distended  MS: No edema; No deformity. Neuro:  Nonfocal  Psych:  Normal affect   Labs    High Sensitivity Troponin:   Recent Labs  Lab 07/21/22 1523 07/21/22 1730  TROPONINIHS 159* 102*     Chemistry Recent Labs  Lab 07/21/22 1215 07/22/22 0217 07/23/22 0147  NA 137 135 134*  K 4.4 4.4 3.3*  CL 102 99 101  CO2 29 24 25   GLUCOSE 106* 123* 112*  BUN 9 7* 9  CREATININE 0.97 0.90 0.89  CALCIUM 9.7 9.1 8.9  MG  --  2.0 2.0  PROT 7.3  --   --   ALBUMIN 4.5  --  3.4*  AST 19  --   --   ALT 19  --   --   ALKPHOS 75  --   --   BILITOT 0.5  --   --   GFRNONAA  --  >60 >60  ANIONGAP  --  12 8    Lipids  Recent Labs  Lab 07/21/22 1215  CHOL 225*  TRIG 205.0*  HDL 41.20  CHOLHDL 5    Hematology Recent Labs  Lab 07/21/22 1215 07/22/22 0217 07/23/22 0147  WBC 5.7 9.9 8.9  RBC 4.80 5.01 4.95  HGB 15.0 15.5  15.3  HCT 43.0 44.1 42.8  MCV 89.6 88.0 86.5  MCH  --  30.9 30.9  MCHC 34.9 35.1 35.7  RDW 13.9 12.7 13.0  PLT 263.0 242 231   Thyroid  Recent Labs  Lab 07/21/22 1215  TSH 1.21    BNPNo results for input(s): "BNP", "PROBNP" in the last 168 hours.  DDimer  Recent Labs  Lab 07/21/22 1215 07/21/22 1524  DDIMER 0.38 0.29     Radiology    CARDIAC CATHETERIZATION  Result Date: 07/22/2022   Prox RCA lesion is 30% stenosed.   Mid RCA lesion is 30% stenosed.   Prox Cx to Mid Cx lesion is 30% stenosed.   Ost Cx to Prox Cx lesion is 30% stenosed.   Prox LAD to Mid LAD lesion is 40% stenosed.   Ramus lesion is 99% stenosed.   A drug-eluting stent was successfully placed using a SYNERGY XD 2.50X32.   Post intervention, there is a 0% residual stenosis. NSTEMI secondary to thrombotic sub-total occlusion of the intermediate branch Successful PTCA/DES x 1 intermediate branch The LAD is a large vessel that courses to the apex. There is mild stenosis in the mid LAD. This is not flow limiting The Circumflex is a large caliber vessel with mild proximal and mid stenosis The RCA is a large dominant vessel with mild proximal and mid  stenosis. LVEDP=8 mmHg Recommendations: Will continue DAPT with ASA and Brilinta for one year. Continue high intensity statin and beta blocker.   CT Angio Chest/Abd/Pel for Dissection W and/or Wo Contrast  Result Date: 07/21/2022 CLINICAL DATA:  Acute aortic syndrome.  Shortness of breath. EXAM: CT ANGIOGRAPHY CHEST, ABDOMEN AND PELVIS TECHNIQUE: Non-contrast CT of the chest was initially obtained. Multidetector CT imaging through the chest, abdomen and pelvis was performed using the standard protocol during bolus administration of intravenous contrast. Multiplanar reconstructed images and MIPs were obtained and reviewed to evaluate the vascular anatomy. RADIATION DOSE REDUCTION: This exam was performed according to the departmental dose-optimization program which includes automated exposure control, adjustment of the mA and/or kV according to patient size and/or use of iterative reconstruction technique. CONTRAST:  133mL OMNIPAQUE IOHEXOL 350 MG/ML SOLN COMPARISON:  Report outside study 04/27/2016. Abdomen pelvis CT 03/03/2021. Chest x-ray earlier 07/21/2022 FINDINGS: CTA CHEST FINDINGS Cardiovascular: Pulsation artifact along the ascending aorta. No mediastinal hematoma. There is mild atherosclerotic plaque identified along the thoracic aorta. No dissection or aneurysm formation. Diameter of the ascending aorta proximally at its origin has a diameter of 2.5 cm. Ascending aorta at the level of the right pulmonary artery 3.1 x 3.0 cm. Diameter of the aortic arch distally has a measurement of 2.7 cm. The descending thoracic aorta at the level of the right pulmonary artery measures 2.6 x 2.5 cm. The origin of the great vessels is preserved. Mild coronary artery calcifications are seen. The heart is nonenlarged. No pericardial effusion. Mediastinum/Nodes: No specific abnormal lymph node enlargement seen in the axillary region, hilum or mediastinum. No pericardial effusion. Normal caliber thoracic esophagus.  Lungs/Pleura: No consolidation, pneumothorax or effusion. There is some basilar atelectasis. 3 mm nodule right lung anteriorly on series 8, image 40. Few other tiny nodules as well similar size. Musculoskeletal: Mild degenerative changes are seen along the spine. Sclerotic focus within the vertebral body at T3, possible bone island. Review of the MIP images confirms the above findings. CTA ABDOMEN AND PELVIS FINDINGS VASCULAR Aorta: Mild atherosclerotic changes along the abdominal aorta. There is a focal area of ectasia of the  infrarenal abdominal aorta measuring 2.7 by 1.9 cm. This is at the level of the origin of the IMA. No dissection. Celiac: Patent without evidence of aneurysm, dissection, vasculitis or significant stenosis. SMA: Patent without evidence of aneurysm, dissection, vasculitis or significant stenosis. Renals: Both renal arteries are patent without evidence of aneurysm, dissection, vasculitis, fibromuscular dysplasia or significant stenosis. IMA: Patent without evidence of aneurysm, dissection, vasculitis or significant stenosis. Inflow: Ectatic right common iliac artery measuring up to 20 mm in diameter. The left is also ectatic but to a lesser extent measuring a diameter of 16 mm. Mild plaque. No significant stenosis. Veins: No obvious venous abnormality within the limitations of this arterial phase study. Retroaortic left renal vein. Review of the MIP images confirms the above findings. NON-VASCULAR Hepatobiliary: With the limits of the early phase of the contrast bolus, the liver is grossly preserved. The gallbladder is nondilated. Pancreas: Unremarkable. No pancreatic ductal dilatation or surrounding inflammatory changes. Spleen: Normal in size without focal abnormality. Adrenals/Urinary Tract: The right adrenal gland is preserved. The left is slightly thickened and nodular. Unchanged from previous. No collecting system dilatation of either kidney. There are some nonobstructing renal stones as  seen previously. Example on the left measures 6 mm and right 2 mm. The ureters have normal course and caliber down to the distended urinary bladder. Stomach/Bowel: There is a large right inguinal hernia involving mesenteric fat and loops of small bowel. No obstruction. Scattered colonic stool. Slightly redundant course to the sigmoid colon. Few colonic diverticula. There is also loops of transverse colon extending between the anterior liver margin in the anterior abdominal wall. Normal appendix. The stomach is moderately distended with debris and fluid. Again no bowel dilatation. Lymphatic: No specific abnormal lymph node enlargement. Reproductive: Enlarged prostate with mass effect along the base of the bladder. Prostate size again proximally 7 x 6 x 5.4 cm. Other: Small fat containing umbilical hernia. No free air or free fluid. Musculoskeletal: Mild degenerative changes are seen along the spine and pelvis. Review of the MIP images confirms the above findings. IMPRESSION: Mild atherosclerotic calcified plaque. No thoracic aortic dissection or aneurysm formation. 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 Recommend follow-up ultrasound every 5 years. This recommendation follows ACR consensus guidelines: White Paper of the ACR Incidental Findings Committee II on Vascular Findings. J Am Coll Radiol 2013; 10:789-794. Ectatic bilateral common iliac arteries, right-greater-than-left. Bilateral nonobstructing renal stones. Large right inguinal hernia involving small bowel without obstruction. Tiny lung nodules measuring up to 3 mm. No follow-up needed if patient is low-risk (and has no known or suspected primary neoplasm). Non-contrast chest CT can be considered in 12 months if patient is high-risk. This recommendation follows the consensus statement: Guidelines for Management of Incidental Pulmonary Nodules Detected on CT Images: From the Fleischner Society 2017;  Radiology 2017; 284:228-243. Enlarged prostate. Please correlate with the history in the patient's PSA Electronically Signed   By: Jill Side M.D.   On: 07/21/2022 17:35   DG Chest Portable 1 View  Result Date: 07/21/2022 CLINICAL DATA:  Shortness of breath EXAM: PORTABLE CHEST 1 VIEW COMPARISON:  None Available. FINDINGS: The heart size and mediastinal contours are within normal limits. Both lungs are clear. The visualized skeletal structures are unremarkable. IMPRESSION: No acute abnormality of the lungs in AP portable projection. Electronically Signed   By: Delanna Ahmadi M.D.   On: 07/21/2022 15:57    Cardiac Studies   Cardiac  catheterization sent, PCI and stent (07/22/2022)  Conclusion      Prox RCA lesion is 30% stenosed.   Mid RCA lesion is 30% stenosed.   Prox Cx to Mid Cx lesion is 30% stenosed.   Ost Cx to Prox Cx lesion is 30% stenosed.   Prox LAD to Mid LAD lesion is 40% stenosed.   Ramus lesion is 99% stenosed.   A drug-eluting stent was successfully placed using a SYNERGY XD 2.50X32.   Post intervention, there is a 0% residual stenosis.   NSTEMI secondary to thrombotic sub-total occlusion of the intermediate branch Successful PTCA/DES x 1 intermediate branch The LAD is a large vessel that courses to the apex. There is mild stenosis in the mid LAD. This is not flow limiting The Circumflex is a large caliber vessel with mild proximal and mid stenosis The RCA is a large dominant vessel with mild proximal and mid stenosis.  LVEDP=8 mmHg   Recommendations: Will continue DAPT with ASA and Brilinta for one year. Continue high intensity statin and beta blocker.   stic Dominance: Right  Intervention       Patient Profile     Baxton Wittbrodt is a 72 y.o. male with a hx of hypertension, hyperlipidemia, psoriasis, nephrolithiasis who is being seen 07/22/2022 for the evaluation of chest pain at the request of Dr. Cyndia Skeeters   Assessment & Plan    1: Non-STEMI-troponins rose to  159.  Cardiac catheterization revealed scattered nonobstructive disease other than the ramus branch which was a moderate-sized vessel and had severe diffuse narrowing.  He underwent PCI and drug-eluting stenting with a 2.5 x 32 mm long Synergy drug-eluting stent with excellent result.  He is completely asymptomatic today.  He is on aspirin and ticagrelor which will need to be on uninterrupted for 12 months.  2D echo was ordered but has yet to be performed.  I prefer that this be done prior to discharge.  2: Hyperlipidemia-LDL 131 on atorvastatin 20 mg a day.  This has been increased to 80 mg a day.  LDL target less than 70  3: Essential hypertension-on metoprolol and losartan.  Blood pressure 106/73 today.  Patient had excellent result from ramus branch intervention.  Await results of 2D echo.  Anticipate discharge later today per the primary service.  Will we will arrange TOC 7.  Return office visit with me in 4 to 6 weeks.  [place] will sign off.   Medication Recommendations: Increase atorvastatin 80 mg a day, dual antiplatelet therapy Other recommendations (labs, testing, etc): None Follow up as an outpatient: TOC 7, return office visit with me in 4 to 6 weeks.  For questions or updates, please contact [place] Please consult [email or web address] for contact info under        Signed, Quay Burow, MD  07/23/2022, 8:58 AM

## 2022-07-27 NOTE — Telephone Encounter (Signed)
Patient scheduled for hospital follow up on April 3rd at 8:25am with Melvenia Needles

## 2022-07-28 NOTE — Progress Notes (Signed)
Cardiology Office Note:    Date:  07/29/2022   ID:  Bryan Craig, DOB 1950/08/02, MRN [id number]  PCP:  Saguier, Edward, [place] Cardiologist: Jenne Campus, MD   Reason for visit: Hospital follow-up  History of Present Illness:    Bryan Craig is a 72 y.o. male with a hx of hypertension, hyperlipidemia, psoriasis and nephrolithiasis, family history of CAD.  His PCP referred him to the ED 07/21/22 after complaints of chest pain with exercise & positive troponin.  LHC showed 99% ramus lesion that was treated with DES and otherwise nonobstructing CAD.  Today, patient states he is doing well.  He states he is increasing his walking.  He denies chest pain, shortness of breath, palpitations, bleeding, lightheadedness and syncope.  Radial cath site with minimal bruising.  Blood pressure readings at home include 132/88 and 116/74.  He has cardiology follow-up in [facility].  He is interested in cardiac rehab.    Past Medical History:  Diagnosis Date   Hyperlipidemia    Hypertension    Kidney stone    Retinal detachment     Past Surgical History:  Procedure Laterality Date   CORONARY STENT INTERVENTION N/A 07/22/2022   Procedure: CORONARY STENT INTERVENTION;  Surgeon: Burnell Blanks, MD;  Location: [facility];  Service: Cardiovascular;  Laterality: N/A;   EYE SURGERY     LEFT HEART CATH AND CORONARY ANGIOGRAPHY N/A 07/22/2022   Procedure: LEFT HEART CATH AND CORONARY ANGIOGRAPHY;  Surgeon: Burnell Blanks, MD;  Location: [facility];  Service: Cardiovascular;  Laterality: N/A;    Current Medications: Current Meds  Medication Sig   aspirin EC 81 MG tablet Take 1 tablet (81 mg total) by mouth daily. Swallow whole.   atorvastatin (LIPITOR) 80 MG tablet Take 1 tablet (80 mg total) by mouth at bedtime.   Difluprednate 0.05 % EMUL Administer 1 drop into the left eye daily.   metoprolol succinate (TOPROL-XL) 50 MG 24 hr tablet Take 1 tablet  (50 mg total) by mouth daily.   Risankizumab-rzaa (SKYRIZI) 150 MG/ML SOSY Inject into the skin once a week.   telmisartan (MICARDIS) 80 MG tablet Take 1 tablet (80 mg total) by mouth daily.   ticagrelor (BRILINTA) 90 MG TABS tablet Take 1 tablet (90 mg total) by mouth 2 (two) times daily.   triamcinolone cream (KENALOG) 0.1 % Apply 1 Application topically 2 (two) times daily. (Patient taking differently: Apply 1 Application topically 2 (two) times daily as needed (irritation).)     Allergies:   Lisinopril and Penicillins   Social History   Socioeconomic History   Marital status: Married    Spouse name: Not on file   Number of children: Not on file   Years of education: Not on file   Highest education level: Not on file  Occupational History   Not on file  Tobacco Use   Smoking status: Never   Smokeless tobacco: Never  Substance and Sexual Activity   Alcohol use: Yes    Comment: rare occasional social drinker.   Drug use: Never   Sexual activity: Yes  Other Topics Concern   Not on file  Social History Narrative   Not on file   Social Determinants of Health   Financial Resource Strain: Not on file  Food Insecurity: Patient Declined (07/21/2022)   Hunger Vital Sign    Worried About Running Out of Food in the Last Year: Patient declined    [place]  in the Last Year: Patient declined  Transportation Needs: Patient Declined (07/21/2022)   PRAPARE - Hydrologist (Medical): Patient declined    Lack of Transportation (Non-Medical): Patient declined  Physical Activity: Not on file  Stress: Not on file  Social Connections: Not on file     Family History: The patient's family history is not on file.  ROS:   Please see the history of present illness.     EKGs/Labs/Other Studies Reviewed:    Recent Labs: 07/21/2022: ALT 19; TSH 1.21 07/23/2022: BUN 9; Creatinine, Ser 0.89; Hemoglobin 15.3; Magnesium 2.0; Platelets 231; Potassium 3.3; Sodium  134   Recent Lipid Panel Lab Results  Component Value Date/Time   CHOL 225 (H) 07/21/2022 12:15 PM   TRIG 205.0 (H) 07/21/2022 12:15 PM   HDL 41.20 07/21/2022 12:15 PM   LDLCALC 138 (H) 11/25/2021 07:23 AM   LDLDIRECT 131.0 07/21/2022 12:15 PM    Physical Exam:    VS:  BP (!) 142/82   Pulse 84   Ht 5\' 8"  (1.727 m)   Wt 183 lb (83 kg)   SpO2 98%   BMI 27.83 kg/m    No data found.   Wt Readings from Last 3 Encounters:  07/29/22 183 lb (83 kg)  07/23/22 177 lb 4.8 oz (80.4 kg)  07/21/22 188 lb (85.3 kg)     GEN:  Well nourished, well developed in no acute distress HEENT: Normal NECK: No JVD; No carotid bruits CARDIAC: RRR, no murmurs, rubs, gallops RESPIRATORY:  Clear to auscultation without rales, wheezing or rhonchi  ABDOMEN: Soft, non-tender, non-distended MUSCULOSKELETAL: No edema SKIN: Warm and dry; R radial site with strong pulse, minimal bruising NEUROLOGIC:  Alert and oriented PSYCHIATRIC:  Normal affect     ASSESSMENT AND PLAN   Coronary artery disease, no angina -NSTEMI March 2023 s/p DES to Ramus -RF include HTN, HLD, family history of CAD -Cardiac rehab recommended, clear to start.   -With NSTEMI, continue DAPT for 1 year -on aspirin and Brilinta. -Continue beta-blocker and ARB.  Continue high intensity statin therapy.  Hypertension -BP well-controlled at home.  Recommended to bring blood pressure log to follow-up appointment. -Continue current medications. -Goal BP is <130/80.  Recommend DASH diet (high in vegetables, fruits, low-fat dairy products, whole grains, poultry, fish, and nuts and low in sweets, sugar-sweetened beverages, and red meats), salt restriction and increase physical activity.  Hyperlipidemia with goal LDL less than 70 -LDL 131 at ED, atorvastatin increased from 20 mg to 80 mg daily. -Check fasting lipids in 3 months. -Discussed cholesterol lowering diets - Mediterranean diet, DASH diet, vegetarian diet, low-carbohydrate diet and  avoidance of trans fats.  Discussed healthier choice substitutes.  Nuts, high-fiber foods, and fiber supplements may also improve lipids.        Disposition - Follow-up on Sep 09, 2022 as scheduled.   Medication Adjustments/Labs and Tests Ordered: Current medicines are reviewed at length with the patient today.  Concerns regarding medicines are outlined above.  No orders of the defined types were placed in this encounter.  No orders of the defined types were placed in this encounter.   Patient Instructions  Medication Instructions:  No Changes *If you need a refill on your cardiac medications before your next appointment, please call your pharmacy*   Lab Work: No labs If you have labs (blood work) drawn today and your tests are completely normal, you will receive your results only by: [place] (if you have MyChart)  OR A paper copy in the mail If you have any lab test that is abnormal or we need to change your treatment, we will call you to review the results.   Testing/Procedures: No Testing   Follow-Up: At [facility], you and your health needs are our priority.  As part of our continuing mission to provide you with exceptional heart care, we have created designated Provider Care Teams.  These Care Teams include your primary Cardiologist (physician) and Advanced Practice Providers (APPs -  Physician Assistants and Nurse Practitioners) who all work together to provide you with the care you need, when you need it.  We recommend signing up for the patient portal called "MyChart".  Sign up information is provided on this After Visit Summary.  MyChart is used to connect with patients for Virtual Visits (Telemedicine).  Patients are able to view lab/test results, encounter notes, upcoming appointments, etc.  Non-urgent messages can be sent to your provider as well.   To learn more about what you can do with MyChart, go to [email or web address].    Your next  appointment:   Keep Scheduled Appointment  Provider:   Jenne Campus, MD   Other Instructions Follow up with Cardiac Rehab. Bring Blood Pressure Log to Follow up Appointment   Signed, Warren Lacy, PA-C  07/29/2022 12:36 PM    [place]

## 2022-07-29 ENCOUNTER — Encounter: Payer: Self-pay | Admitting: Physician Assistant

## 2022-07-29 ENCOUNTER — Ambulatory Visit: Payer: Medicare Other | Attending: Physician Assistant | Admitting: Physician Assistant

## 2022-07-29 VITALS — BP 142/82 | HR 84 | Ht 68.0 in | Wt 183.0 lb

## 2022-07-29 DIAGNOSIS — E785 Hyperlipidemia, unspecified: Secondary | ICD-10-CM

## 2022-07-29 DIAGNOSIS — I10 Essential (primary) hypertension: Secondary | ICD-10-CM | POA: Diagnosis not present

## 2022-07-29 DIAGNOSIS — I2510 Atherosclerotic heart disease of native coronary artery without angina pectoris: Secondary | ICD-10-CM

## 2022-07-29 NOTE — Patient Instructions (Signed)
Medication Instructions:  No Changes *If you need a refill on your cardiac medications before your next appointment, please call your pharmacy*   Lab Work: No labs If you have labs (blood work) drawn today and your tests are completely normal, you will receive your results only by: [place] (if you have MyChart) OR A paper copy in the mail If you have any lab test that is abnormal or we need to change your treatment, we will call you to review the results.   Testing/Procedures: No Testing   Follow-Up: At [facility], you and your health needs are our priority.  As part of our continuing mission to provide you with exceptional heart care, we have created designated Provider Care Teams.  These Care Teams include your primary Cardiologist (physician) and Advanced Practice Providers (APPs -  Physician Assistants and Nurse Practitioners) who all work together to provide you with the care you need, when you need it.  We recommend signing up for the patient portal called "MyChart".  Sign up information is provided on this After Visit Summary.  MyChart is used to connect with patients for Virtual Visits (Telemedicine).  Patients are able to view lab/test results, encounter notes, upcoming appointments, etc.  Non-urgent messages can be sent to your provider as well.   To learn more about what you can do with MyChart, go to [email or web address].    Your next appointment:   Keep Scheduled Appointment  Provider:   Jenne Campus, MD   Other Instructions Follow up with Cardiac Rehab. Bring Blood Pressure Log to Follow up Appointment

## 2022-08-03 ENCOUNTER — Telehealth: Payer: Self-pay | Admitting: Cardiovascular Disease

## 2022-08-03 ENCOUNTER — Emergency Department (HOSPITAL_BASED_OUTPATIENT_CLINIC_OR_DEPARTMENT_OTHER): Payer: Medicare Other

## 2022-08-03 ENCOUNTER — Other Ambulatory Visit: Payer: Self-pay

## 2022-08-03 ENCOUNTER — Encounter (HOSPITAL_BASED_OUTPATIENT_CLINIC_OR_DEPARTMENT_OTHER): Payer: Self-pay

## 2022-08-03 ENCOUNTER — Emergency Department (HOSPITAL_BASED_OUTPATIENT_CLINIC_OR_DEPARTMENT_OTHER)
Admission: EM | Admit: 2022-08-03 | Discharge: 2022-08-04 | Disposition: A | Discharge status: Home / Self Care | Payer: Medicare Other | Attending: Emergency Medicine | Admitting: Emergency Medicine

## 2022-08-03 DIAGNOSIS — Z7982 Long term (current) use of aspirin: Secondary | ICD-10-CM | POA: Diagnosis not present

## 2022-08-03 DIAGNOSIS — N132 Hydronephrosis with renal and ureteral calculous obstruction: Secondary | ICD-10-CM | POA: Diagnosis not present

## 2022-08-03 DIAGNOSIS — N201 Calculus of ureter: Secondary | ICD-10-CM | POA: Diagnosis not present

## 2022-08-03 DIAGNOSIS — R109 Unspecified abdominal pain: Secondary | ICD-10-CM | POA: Diagnosis present

## 2022-08-03 LAB — CBC WITH DIFFERENTIAL/PLATELET
Abs Immature Granulocytes: 0.05 10*3/uL (ref 0.00–0.07)
Basophils Absolute: 0.1 10*3/uL (ref 0.0–0.1)
Basophils Relative: 0 %
Eosinophils Absolute: 0.1 10*3/uL (ref 0.0–0.5)
Eosinophils Relative: 1 %
HCT: 42.2 % (ref 39.0–52.0)
Hemoglobin: 14.5 g/dL (ref 13.0–17.0)
Immature Granulocytes: 0 %
Lymphocytes Relative: 14 %
Lymphs Abs: 1.7 10*3/uL (ref 0.7–4.0)
MCH: 30.9 pg (ref 26.0–34.0)
MCHC: 34.4 g/dL (ref 30.0–36.0)
MCV: 90 fL (ref 80.0–100.0)
Monocytes Absolute: 0.9 10*3/uL (ref 0.1–1.0)
Monocytes Relative: 7 %
Neutro Abs: 9.6 10*3/uL — ABNORMAL HIGH (ref 1.7–7.7)
Neutrophils Relative %: 78 %
Platelets: 284 10*3/uL (ref 150–400)
RBC: 4.69 MIL/uL (ref 4.22–5.81)
RDW: 13 % (ref 11.5–15.5)
WBC: 12.4 10*3/uL — ABNORMAL HIGH (ref 4.0–10.5)
nRBC: 0 % (ref 0.0–0.2)

## 2022-08-03 LAB — URINALYSIS, ROUTINE W REFLEX MICROSCOPIC
Bilirubin Urine: NEGATIVE
Glucose, UA: NEGATIVE mg/dL
Ketones, ur: NEGATIVE mg/dL
Leukocytes,Ua: NEGATIVE
Nitrite: NEGATIVE
Protein, ur: NEGATIVE mg/dL
Specific Gravity, Urine: >=1.03 (ref 1.005–1.030)
pH: 5.5 (ref 5.0–8.0)

## 2022-08-03 LAB — BASIC METABOLIC PANEL
Anion gap: 9 (ref 5–15)
BUN: 18 mg/dL (ref 8–23)
CO2: 24 mmol/L (ref 22–32)
Calcium: 8.9 mg/dL (ref 8.9–10.3)
Chloride: 98 mmol/L (ref 98–111)
Creatinine, Ser: 1.38 mg/dL — ABNORMAL HIGH (ref 0.61–1.24)
GFR, Estimated: 54 mL/min — ABNORMAL LOW (ref >60)
Glucose, Bld: 121 mg/dL — ABNORMAL HIGH (ref 70–99)
Potassium: 3.8 mmol/L (ref 3.5–5.1)
Sodium: 131 mmol/L — ABNORMAL LOW (ref 135–145)

## 2022-08-03 LAB — URINALYSIS, MICROSCOPIC (REFLEX): WBC, UA: NONE SEEN WBC/hpf (ref 0–5)

## 2022-08-03 MED ORDER — FENTANYL CITRATE PF 50 MCG/ML IJ SOSY
50.0000 ug | PREFILLED_SYRINGE | Freq: Once | INTRAMUSCULAR | Status: AC
  Administered 2022-08-03: 50 ug via INTRAVENOUS
  Filled 2022-08-03: qty 1

## 2022-08-03 MED ORDER — ONDANSETRON HCL 4 MG/2ML IJ SOLN
4.0000 mg | Freq: Once | INTRAMUSCULAR | Status: AC
  Administered 2022-08-03: 4 mg via INTRAVENOUS
  Filled 2022-08-03: qty 2

## 2022-08-03 NOTE — ED Triage Notes (Signed)
Pt c/o left flank pain & nausea that started around 19:00.

## 2022-08-03 NOTE — ED Provider Notes (Signed)
[place] [facility] AT [facility]  Provider Note  CSN: [id number] Arrival date & time: 08/03/22 2221  History Chief Complaint  Patient presents with   Flank Pain    Bryan Craig is a 72 y.o. male with history of kidney stones also recently had NSTEMI with DES and now on DAPT (ASA and Brillinta). Reports onset of left flank pain and nausea a few hours ago. Does not radiate, associated with some urinary hesitancy. Similar to previous kidney stones. During his recent workup for NSTEMI he had a CTA aorta which showed intra-renal stones. No fever.    Home Medications Prior to Admission medications   Medication Sig Start Date End Date Taking? Authorizing Provider  aspirin EC 81 MG tablet Take 1 tablet (81 mg total) by mouth daily. Swallow whole. 07/23/22   Almon Hercules, MD  atorvastatin (LIPITOR) 80 MG tablet Take 1 tablet (80 mg total) by mouth at bedtime. 07/23/22   Almon Hercules, MD  Difluprednate 0.05 % EMUL Administer 1 drop into the left eye daily. 08/06/15   [provider]  metoprolol succinate (TOPROL-XL) 50 MG 24 hr tablet Take 1 tablet (50 mg total) by mouth daily. 11/11/21   Saguier, Ramon Dredge, PA-C  Risankizumab-rzaa [facility]) 150 MG/ML SOSY Inject into the skin once a week.    [provider]  telmisartan (MICARDIS) 80 MG tablet Take 1 tablet (80 mg total) by mouth daily. 11/11/21   Saguier, Ramon Dredge, PA-C  ticagrelor (BRILINTA) 90 MG TABS tablet Take 1 tablet (90 mg total) by mouth 2 (two) times daily. 07/23/22   Almon Hercules, MD  triamcinolone cream (KENALOG) 0.1 % Apply 1 Application topically 2 (two) times daily. Patient taking differently: Apply 1 Application topically 2 (two) times daily as needed (irritation). 11/11/21   Saguier, Ramon Dredge, PA-C     Allergies    Lisinopril and Penicillins   Review of Systems   Review of Systems Please see HPI for pertinent positives and negatives  Physical Exam BP 133/79 (BP Location: Left Arm)    Pulse 63   Temp 97.6 F (36.4 C) (Oral)   Resp 18   Ht 5\' 8"  (1.727 m)   Wt 81.6 kg   SpO2 100%   BMI 27.37 kg/m   Physical Exam Vitals and nursing note reviewed.  Constitutional:      Appearance: Normal appearance.  HENT:     Head: Normocephalic and atraumatic.     Nose: Nose normal.     Mouth/Throat:     Mouth: Mucous membranes are moist.  Eyes:     Extraocular Movements: Extraocular movements intact.     Conjunctiva/sclera: Conjunctivae normal.  Cardiovascular:     Rate and Rhythm: Normal rate.  Pulmonary:     Effort: Pulmonary effort is normal.     Breath sounds: Normal breath sounds.  Abdominal:     General: Abdomen is flat.     Palpations: Abdomen is soft.     Tenderness: There is no abdominal tenderness.  Musculoskeletal:        General: No swelling. Normal range of motion.     Cervical back: Neck supple.  Skin:    General: Skin is warm and dry.  Neurological:     General: No focal deficit present.     Mental Status: He is alert.  Psychiatric:        Mood and Affect: Mood normal.     ED Results / Procedures / Treatments   EKG None  Procedures Procedures  Medications Ordered in the ED Medications - No data to display  Initial Impression and Plan  Patient with known history of renal stones here with flank pain. Suspect this is renal colic, also consider retroperitoneal hematoma given recent NSTEMI and DAPT. Plan labs, CT stone study and pain/nausea meds for comfort.   ED Course       MDM Rules/Calculators/A&P Medical Decision Making Amount and/or Complexity of Data Reviewed Labs: ordered. Radiology: ordered.     Final Clinical Impression(s) / ED Diagnoses Final diagnoses:  None    Rx / DC Orders ED Discharge Orders     None

## 2022-08-03 NOTE — ED Notes (Signed)
Patient states it is very difficult to urinate and also quite painful. Will try and provide a sample when possible.

## 2022-08-03 NOTE — Telephone Encounter (Signed)
Patient's wife is calling wanting to know when the patient is able to begin activities like cutting the grass again. Please advise.

## 2022-08-04 ENCOUNTER — Telehealth (HOSPITAL_COMMUNITY): Payer: Self-pay

## 2022-08-04 MED ORDER — TAMSULOSIN HCL 0.4 MG PO CAPS: 0.4000 mg | ORAL_CAPSULE | Freq: Every day | ORAL | 0 refills | Status: AC

## 2022-08-04 MED ORDER — HYDROCODONE-ACETAMINOPHEN 5-325 MG PO TABS: 1.0000 | ORAL_TABLET | Freq: Four times a day (QID) | ORAL | 0 refills | Status: DC | PRN

## 2022-08-04 MED ORDER — ONDANSETRON 4 MG PO TBDP: 4.0000 mg | ORAL_TABLET | Freq: Three times a day (TID) | ORAL | 0 refills | Status: DC | PRN

## 2022-08-04 NOTE — Telephone Encounter (Signed)
Pt insurance is active and benefits verified through [facility]. Co-pay $0.00, DED $0.00/$0.00 met, out of pocket $4,500.00/$13.28 met, co-insurance 0%. No pre-authorization required. [employer]port, 08/04/22 @ 2:02PM, REF#[id number],   How many CR sessions are covered? (36 sessions for TCR, 72 sessions for ICR)72 Is this a lifetime maximum or an annual maximum? Annual Has the member used any of these services to date? No Is there a time limit (weeks/months) on start of program and/or program completion? No     Will contact patient to see if he is interested in the Cardiac Rehab Program.

## 2022-08-04 NOTE — ED Notes (Signed)
Discharge paperwork reviewed entirely with patient, including Rx's and follow up care. Pain was under control. Pt verbalized understanding as well as all parties involved. No questions or concerns voiced at the time of discharge. No acute distress noted.   Pt ambulated out to PVA without incident or assistance.  

## 2022-08-04 NOTE — Telephone Encounter (Signed)
Called the pt's wife and gave her Dr Hazle Coca message: Next Monday, 4/15, can begin light physical activities like mowing the grass.   She verbalized understanding Please call or MyChart message with any questions/concerns.

## 2022-08-04 NOTE — Telephone Encounter (Signed)
Called patient to see if he was interested in participating in the Cardiac Rehab Program. Patient stated yes. Patient will come in for orientation on 08/10/22 @ 10AM and will attend the 1:45PM exercise class. Went over insurance, patient verbalized understanding.     Pensions consultant.

## 2022-08-10 ENCOUNTER — Encounter (HOSPITAL_COMMUNITY)
Admission: RE | Admit: 2022-08-10 | Discharge: 2022-08-10 | Disposition: A | Discharge status: Home / Self Care | Payer: Medicare Other | Source: Ambulatory Visit | Attending: Cardiology | Admitting: Cardiology

## 2022-08-10 DIAGNOSIS — I214 Non-ST elevation (NSTEMI) myocardial infarction: Secondary | ICD-10-CM | POA: Diagnosis not present

## 2022-08-10 NOTE — Progress Notes (Signed)
Cardiac Individual Treatment Plan  Patient Details  Name: Bryan Craig MRN: [id number] Date of Birth: 1950-07-20 Referring Provider:   Flowsheet Row INTENSIVE CARDIAC REHAB ORIENT from 08/10/2022 in [facility]  Referring Provider Gypsy Balsam, MD       Initial Encounter Date:  Flowsheet Row INTENSIVE CARDIAC REHAB ORIENT from 08/10/2022 in [facility]  Date 08/10/22       Visit Diagnosis: No diagnosis found.  Patient's Home Medications on Admission:  Current Outpatient Medications:    aspirin EC 81 MG tablet, Take 1 tablet (81 mg total) by mouth daily. Swallow whole., Disp: 90 tablet, Rfl: 3   atorvastatin (LIPITOR) 80 MG tablet, Take 1 tablet (80 mg total) by mouth at bedtime., Disp: 90 tablet, Rfl: 1   Difluprednate 0.05 % EMUL, Administer 1 drop into the left eye daily., Disp: , Rfl:    HYDROcodone-acetaminophen (NORCO/VICODIN) 5-325 MG tablet, Take 1 tablet by mouth every 6 (six) hours as needed for severe pain., Disp: 12 tablet, Rfl: 0   metoprolol succinate (TOPROL-XL) 50 MG 24 hr tablet, Take 1 tablet (50 mg total) by mouth daily., Disp: 90 tablet, Rfl: 3   ondansetron (ZOFRAN-ODT) 4 MG disintegrating tablet, Take 1 tablet (4 mg total) by mouth every 8 (eight) hours as needed for nausea or vomiting., Disp: 20 tablet, Rfl: 0   Risankizumab-rzaa (SKYRIZI) 150 MG/ML SOSY, Inject into the skin once a week., Disp: , Rfl:    tamsulosin (FLOMAX) 0.4 MG CAPS capsule, Take 1 capsule (0.4 mg total) by mouth daily for 14 days., Disp: 14 capsule, Rfl: 0   telmisartan (MICARDIS) 80 MG tablet, Take 1 tablet (80 mg total) by mouth daily., Disp: 90 tablet, Rfl: 3   ticagrelor (BRILINTA) 90 MG TABS tablet, Take 1 tablet (90 mg total) by mouth 2 (two) times daily., Disp: 180 tablet, Rfl: 3   triamcinolone cream (KENALOG) 0.1 %, Apply 1 Application topically 2 (two) times  daily. (Patient taking differently: Apply 1 Application topically 2 (two) times daily as needed (irritation).), Disp: 30 g, Rfl: 0  Past Medical History: Past Medical History:  Diagnosis Date   Hyperlipidemia    Hypertension    Kidney stone    Retinal detachment     Tobacco Use: Social History   Tobacco Use  Smoking Status Never  Smokeless Tobacco Never    Labs: Review Flowsheet  More data exists      Latest Ref Rng & Units 08/04/2019 11/25/2021 02/17/2022 07/21/2022 07/22/2022  Labs for ITP Cardiac and Pulmonary Rehab  Cholestrol 0 - 200 mg/dL 409  811  - 914  -  LDL (calc) 0 - 99 mg/dL 782  956  - - -  Direct LDL mg/dL - - - 213.0  -  HDL-C >86.57 mg/dL 84.69  62.95  - 28.41  -  Trlycerides 0.0 - 149.0 mg/dL 324.4  010.2  - 725.3  -  Hemoglobin A1c 4.8 - 5.6 % 5.5  - 5.9  - 5.8     Capillary Blood Glucose: No results found for: "GLUCAP"   Exercise Target Goals: Exercise Program Goal: Individual exercise prescription set using results from initial 6 min walk test and THRR while considering  patient's activity barriers and safety.   Exercise Prescription Goal: Initial exercise prescription builds to 30-45 minutes a day of aerobic activity, 2-3 days per week.  Home exercise guidelines will be given to patient  during program as part of exercise prescription that the participant will acknowledge.  Activity Barriers & Risk Stratification:  Activity Barriers & Cardiac Risk Stratification - 08/10/22 1157       Activity Barriers & Cardiac Risk Stratification   Activity Barriers None    Cardiac Risk Stratification High   <5 METS on            6 Minute Walk:  6 Minute Walk     Row Name 08/10/22 1156         6 Minute Walk   Phase Initial     Distance 1266 feet     Walk Time 6 minutes     # of Rest Breaks 0     MPH 2.79     METS 2.39     RPE 6     Perceived Dyspnea  0     VO2 Peak 9.77     Symptoms No     Resting HR 80 bpm     Resting BP 108/78      Resting Oxygen Saturation  96 %     Exercise Oxygen Saturation  during 6 min walk 98 %     Max Ex. HR 101 bpm     Max Ex. BP 130/82     2 Minute Post BP 116/76              Oxygen Initial Assessment:   Oxygen Re-Evaluation:   Oxygen Discharge (Final Oxygen Re-Evaluation):   Initial Exercise Prescription:  Initial Exercise Prescription - 08/10/22 1100       Date of Initial Exercise RX and Referring Provider   Date 08/10/22    Referring Provider Gypsy Balsam, MD    Expected Discharge Date 10/23/22      Bike   Level 2    Watts 60    Minutes 15    METs 2.5      Recumbant Elliptical   Level 2    RPM 70    Watts 90    Minutes 15    METs 2.5      Prescription Details   Frequency (times per week) 3    Duration Progress to 30 minutes of continuous aerobic without signs/symptoms of physical distress      Intensity   THRR 40-80% of Max Heartrate 59-118    Ratings of Perceived Exertion 11-13    Perceived Dyspnea 0-4      Progression   Progression Continue progressive overload as per policy without signs/symptoms or physical distress.      Resistance Training   Training Prescription Yes    Weight 3    Reps 10-15             Perform Capillary Blood Glucose checks as needed.  Exercise Prescription Changes:   Exercise Comments:   Exercise Goals and Review:   Exercise Goals     Row Name 08/10/22 1027             Exercise Goals   Increase Physical Activity Yes       Intervention Provide advice, education, support and counseling about physical activity/exercise needs.;Develop an individualized exercise prescription for aerobic and resistive training based on initial evaluation findings, risk stratification, comorbidities and participant's personal goals.       Expected Outcomes Short Term: Attend rehab on a regular basis to increase amount of physical activity.;Long Term: Exercising regularly at least 3-5 days a week.;Long Term: Add in home  exercise to make exercise part  of routine and to increase amount of physical activity.       Increase Strength and Stamina Yes       Intervention Provide advice, education, support and counseling about physical activity/exercise needs.;Develop an individualized exercise prescription for aerobic and resistive training based on initial evaluation findings, risk stratification, comorbidities and participant's personal goals.       Expected Outcomes Short Term: Increase workloads from initial exercise prescription for resistance, speed, and METs.;Short Term: Perform resistance training exercises routinely during rehab and add in resistance training at home;Long Term: Improve cardiorespiratory fitness, muscular endurance and strength as measured by increased METs and functional capacity ( )       Able to understand and use rate of perceived exertion (RPE) scale Yes       Intervention Provide education and explanation on how to use RPE scale       Expected Outcomes Short Term: Able to use RPE daily in rehab to express subjective intensity level;Long Term:  Able to use RPE to guide intensity level when exercising independently       Knowledge and understanding of Target Heart Rate Range (THRR) Yes       Intervention Provide education and explanation of THRR including how the numbers were predicted and where they are located for reference       Expected Outcomes Short Term: Able to use daily as guideline for intensity in rehab;Long Term: Able to use THRR to govern intensity when exercising independently;Short Term: Able to state/look up THRR       Understanding of Exercise Prescription Yes       Intervention Provide education, explanation, and written materials on patient's individual exercise prescription       Expected Outcomes Long Term: Able to explain home exercise prescription to exercise independently;Short Term: Able to explain program exercise prescription                Exercise Goals  Re-Evaluation :   Discharge Exercise Prescription (Final Exercise Prescription Changes):   Nutrition:  Target Goals: Understanding of nutrition guidelines, daily intake of sodium 1500mg , cholesterol 200mg , calories 30% from fat and 7% or less from saturated fats, daily to have 5 or more servings of fruits and vegetables.  Biometrics:  Pre Biometrics - 08/10/22 1024       Pre Biometrics   Waist Circumference 34.5 inches    Hip Circumference 39 inches    Waist to Hip Ratio 0.88 %    Triceps Skinfold 12 mm    % Body Fat 23.8 %    Grip Strength 15 kg    Flexibility 36 in    Single Leg Stand 4.68 seconds              Nutrition Therapy Plan and Nutrition Goals:   Nutrition Assessments:  MEDIFICTS Score Key: ?70 Need to make dietary changes  40-70 Heart Healthy Diet ? 40 Therapeutic Level Cholesterol Diet    Picture Your Plate Scores: <40 Unhealthy dietary pattern with much room for improvement. 41-50 Dietary pattern unlikely to meet recommendations for good health and room for improvement. 51-60 More healthful dietary pattern, with some room for improvement.  >60 Healthy dietary pattern, although there may be some specific behaviors that could be improved.    Nutrition Goals Re-Evaluation:   Nutrition Goals Re-Evaluation:   Nutrition Goals Discharge (Final Nutrition Goals Re-Evaluation):   Psychosocial: Target Goals: Acknowledge presence or absence of significant depression and/or stress, maximize coping skills, provide positive support system. Participant is  able to verbalize types and ability to use techniques and skills needed for reducing stress and depression.  Initial Review & Psychosocial Screening:  Initial Psych Review & Screening - 08/10/22 1026       Initial Review   Current issues with None Identified      Family Dynamics   Good Support System? Yes   Jabe has his wife and 3 daughters for support     Barriers   Psychosocial barriers to  participate in program There are no identifiable barriers or psychosocial needs.      Screening Interventions   Interventions Encouraged to exercise;Provide feedback about the scores to participant    Expected Outcomes Short Term goal: Identification and review with participant of any Quality of Life or Depression concerns found by scoring the questionnaire.;Long Term goal: The participant improves quality of Life and PHQ9 Scores as seen by post scores and/or verbalization of changes             Quality of Life Scores:  Quality of Life - 08/10/22 1201       Quality of Life   Select Quality of Life      Quality of Life Scores   Health/Function Pre 28.43 %    Socioeconomic Pre 27 %    Psych/Spiritual Pre 28.79 %    Family Pre 30 %    GLOBAL Pre 28.44 %            Scores of 19 and below usually indicate a poorer quality of life in these areas.  A difference of  2-3 points is a clinically meaningful difference.  A difference of 2-3 points in the total score of the Quality of Life Index has been associated with significant improvement in overall quality of life, self-image, physical symptoms, and general health in studies assessing change in quality of life.  PHQ-9: Review Flowsheet       08/10/2022 11/03/2021 06/07/2018  Depression screen PHQ 2/9  Decreased Interest 0 0 0  Down, Depressed, Hopeless 0 0 0  PHQ - 2 Score 0 0 0  Altered sleeping 0 - -  Tired, decreased energy 2 - -  Change in appetite 0 - -  Feeling bad or failure about yourself  0 - -  Trouble concentrating 0 - -  Moving slowly or fidgety/restless 0 - -  Suicidal thoughts 0 - -  PHQ-9 Score 2 - -  Difficult doing work/chores Not difficult at all - -   Interpretation of Total Score  Total Score Depression Severity:  1-4 = Minimal depression, 5-9 = Mild depression, 10-14 = Moderate depression, 15-19 = Moderately severe depression, 20-27 = Severe depression   Psychosocial Evaluation and  Intervention:   Psychosocial Re-Evaluation:   Psychosocial Discharge (Final Psychosocial Re-Evaluation):   Vocational Rehabilitation: Provide vocational rehab assistance to qualifying candidates.   Vocational Rehab Evaluation & Intervention:  Vocational Rehab - 08/10/22 1027       Initial Vocational Rehab Evaluation & Intervention   Assessment shows need for Vocational Rehabilitation No   Ariana is retired            Education: Education Goals: Education classes will be provided on a weekly basis, covering required topics. Participant will state understanding/return demonstration of topics presented.     Core Videos: Exercise    Move It!  Clinical staff conducted group or individual video education with verbal and written material and guidebook.  Patient learns the recommended Pritikin exercise program. Exercise with the goal of  living a long, healthy life. Some of the health benefits of exercise include controlled diabetes, healthier blood pressure levels, improved cholesterol levels, improved heart and lung capacity, improved sleep, and better body composition. Everyone should speak with their doctor before starting or changing an exercise routine.  Biomechanical Limitations Clinical staff conducted group or individual video education with verbal and written material and guidebook.  Patient learns how biomechanical limitations can impact exercise and how we can mitigate and possibly overcome limitations to have an impactful and balanced exercise routine.  Body Composition Clinical staff conducted group or individual video education with verbal and written material and guidebook.  Patient learns that body composition (ratio of muscle mass to fat mass) is a key component to assessing overall fitness, rather than body weight alone. Increased fat mass, especially visceral belly fat, can put [place] at increased risk for metabolic syndrome, type 2 diabetes, heart disease, and even  death. It is recommended to combine diet and exercise (cardiovascular and resistance training) to improve your body composition. Seek guidance from your physician and exercise physiologist before implementing an exercise routine.  Exercise Action Plan Clinical staff conducted group or individual video education with verbal and written material and guidebook.  Patient learns the recommended strategies to achieve and enjoy long-term exercise adherence, including variety, self-motivation, self-efficacy, and positive decision making. Benefits of exercise include fitness, good health, weight management, more energy, better sleep, less stress, and overall well-being.  Medical   Heart Disease Risk Reduction Clinical staff conducted group or individual video education with verbal and written material and guidebook.  Patient learns our heart is our most vital organ as it circulates oxygen, nutrients, white blood cells, and hormones throughout the entire body, and carries waste away. Data supports a plant-based eating plan like the Pritikin Program for its effectiveness in slowing progression of and reversing heart disease. The video provides a number of recommendations to address heart disease.   Metabolic Syndrome and Belly Fat  Clinical staff conducted group or individual video education with verbal and written material and guidebook.  Patient learns what metabolic syndrome is, how it leads to heart disease, and how one can reverse it and keep it from coming back. You have metabolic syndrome if you have 3 of the following 5 criteria: abdominal obesity, high blood pressure, high triglycerides, low HDL cholesterol, and high blood sugar.  Hypertension and Heart Disease Clinical staff conducted group or individual video education with verbal and written material and guidebook.  Patient learns that high blood pressure, or hypertension, is very common in the [place]. Hypertension is largely due to  excessive salt intake, but other important risk factors include being overweight, physical inactivity, drinking too much alcohol, smoking, and not eating enough potassium from fruits and vegetables. High blood pressure is a leading risk factor for heart attack, stroke, congestive heart failure, dementia, kidney failure, and premature death. Long-term effects of excessive salt intake include stiffening of the arteries and thickening of heart muscle and organ damage. Recommendations include ways to reduce hypertension and the risk of heart disease.  Diseases of Our Time - Focusing on Diabetes Clinical staff conducted group or individual video education with verbal and written material and guidebook.  Patient learns why the best way to stop diseases of our time is prevention, through food and other lifestyle changes. Medicine (such as prescription pills and surgeries) is often only a Band-Aid on the problem, not a long-term solution. Most common diseases of our time include obesity, type 2  diabetes, hypertension, heart disease, and cancer. The Pritikin Program is recommended and has been proven to help reduce, reverse, and/or prevent the damaging effects of metabolic syndrome.  Nutrition   Overview of the Pritikin Eating Plan  Clinical staff conducted group or individual video education with verbal and written material and guidebook.  Patient learns about the [place] for disease risk reduction. The [place] emphasizes a wide variety of unrefined, minimally-processed carbohydrates, like fruits, vegetables, whole grains, and legumes. Go, Caution, and Stop food choices are explained. Plant-based and lean animal proteins are emphasized. Rationale provided for low sodium intake for blood pressure control, low added sugars for blood sugar stabilization, and low added fats and oils for coronary artery disease risk reduction and weight management.  Calorie Density  Clinical staff conducted  group or individual video education with verbal and written material and guidebook.  Patient learns about calorie density and how it impacts the Pritikin Eating Plan. Knowing the characteristics of the food you choose will help you decide whether those foods will lead to weight gain or weight loss, and whether you want to consume more or less of them. Weight loss is usually a side effect of the Pritikin Eating Plan because of its focus on low calorie-dense foods.  Label Reading  Clinical staff conducted group or individual video education with verbal and written material and guidebook.  Patient learns about the Pritikin recommended label reading guidelines and corresponding recommendations regarding calorie density, added sugars, sodium content, and whole grains.  Dining Out - Part 1  Clinical staff conducted group or individual video education with verbal and written material and guidebook.  Patient learns that restaurant meals can be sabotaging because they can be so high in calories, fat, sodium, and/or sugar. Patient learns recommended strategies on how to positively address this and avoid unhealthy pitfalls.  Facts on Fats  Clinical staff conducted group or individual video education with verbal and written material and guidebook.  Patient learns that lifestyle modifications can be just as effective, if not more so, as many medications for lowering your risk of heart disease. A Pritikin lifestyle can help to reduce your risk of inflammation and atherosclerosis (cholesterol build-up, or plaque, in the artery walls). Lifestyle interventions such as dietary choices and physical activity address the cause of atherosclerosis. A review of the types of fats and their impact on blood cholesterol levels, along with dietary recommendations to reduce fat intake is also included.  Nutrition Action Plan  Clinical staff conducted group or individual video education with verbal and written material and  guidebook.  Patient learns how to incorporate Pritikin recommendations into their lifestyle. Recommendations include planning and keeping personal health goals in mind as an important part of their success.  Healthy Mind-Set    Healthy Minds, Bodies, Hearts  Clinical staff conducted group or individual video education with verbal and written material and guidebook.  Patient learns how to identify when they are stressed. Video will discuss the impact of that stress, as well as the many benefits of stress management. Patient will also be introduced to stress management techniques. The way we think, act, and feel has an impact on our hearts.  How Our Thoughts Can Heal Our Hearts  Clinical staff conducted group or individual video education with verbal and written material and guidebook.  Patient learns that negative thoughts can cause depression and anxiety. This can result in negative lifestyle behavior and serious health problems. Cognitive behavioral therapy is an effective method  to help control our thoughts in order to change and improve our emotional outlook.  Additional Videos:  Exercise    Improving Performance  Clinical staff conducted group or individual video education with verbal and written material and guidebook.  Patient learns to use a non-linear approach by alternating intensity levels and lengths of time spent exercising to help burn more calories and lose more body fat. Cardiovascular exercise helps improve heart health, metabolism, hormonal balance, blood sugar control, and recovery from fatigue. Resistance training improves strength, endurance, balance, coordination, reaction time, metabolism, and muscle mass. Flexibility exercise improves circulation, posture, and balance. Seek guidance from your physician and exercise physiologist before implementing an exercise routine and learn your capabilities and proper form for all exercise.  Introduction to Yoga  Clinical staff  conducted group or individual video education with verbal and written material and guidebook.  Patient learns about yoga, a discipline of the coming together of mind, breath, and body. The benefits of yoga include improved flexibility, improved range of motion, better posture and core strength, increased lung function, weight loss, and positive self-image. Yoga's heart health benefits include lowered blood pressure, healthier heart rate, decreased cholesterol and triglyceride levels, improved immune function, and reduced stress. Seek guidance from your physician and exercise physiologist before implementing an exercise routine and learn your capabilities and proper form for all exercise.  Medical   Aging: Enhancing Your Quality of Life  Clinical staff conducted group or individual video education with verbal and written material and guidebook.  Patient learns key strategies and recommendations to stay in good physical health and enhance quality of life, such as prevention strategies, having an advocate, securing a [place], and keeping a list of medications and system for tracking them. It also discusses how to avoid risk for bone loss.  Biology of Weight Control  Clinical staff conducted group or individual video education with verbal and written material and guidebook.  Patient learns that weight gain occurs because we consume more calories than we burn (eating more, moving less). Even if your body weight is normal, you may have higher ratios of fat compared to muscle mass. Too much body fat puts you at increased risk for cardiovascular disease, heart attack, stroke, type 2 diabetes, and obesity-related cancers. In addition to exercise, following the [place] can help reduce your risk.  Decoding Lab Results  Clinical staff conducted group or individual video education with verbal and written material and guidebook.  Patient learns that lab test reflects one  measurement whose values change over time and are influenced by many factors, including medication, stress, sleep, exercise, food, hydration, pre-existing medical conditions, and more. It is recommended to use the knowledge from this video to become more involved with your lab results and evaluate your numbers to speak with your doctor.   Diseases of Our Time - Overview  Clinical staff conducted group or individual video education with verbal and written material and guidebook.  Patient learns that according to the CDC, 50% to 70% of chronic diseases (such as obesity, type 2 diabetes, elevated lipids, hypertension, and heart disease) are avoidable through lifestyle improvements including healthier food choices, listening to satiety cues, and increased physical activity.  Sleep Disorders Clinical staff conducted group or individual video education with verbal and written material and guidebook.  Patient learns how good quality and duration of sleep are important to overall health and well-being. Patient also learns about sleep disorders and how they impact health  along with recommendations to address them, including discussing with a physician.  Nutrition  Dining Out - Part 2 Clinical staff conducted group or individual video education with verbal and written material and guidebook.  Patient learns how to plan ahead and communicate in order to maximize their dining experience in a healthy and nutritious manner. Included are recommended food choices based on the type of restaurant the patient is visiting.   Fueling a Best boy conducted group or individual video education with verbal and written material and guidebook.  There is a strong connection between our food choices and our health. Diseases like obesity and type 2 diabetes are very prevalent and are in large-part due to lifestyle choices. The Pritikin Eating Plan provides plenty of food and hunger-curbing satisfaction. It is  easy to follow, affordable, and helps reduce health risks.  Menu Workshop  Clinical staff conducted group or individual video education with verbal and written material and guidebook.  Patient learns that restaurant meals can sabotage health goals because they are often packed with calories, fat, sodium, and sugar. Recommendations include strategies to plan ahead and to communicate with the manager, chef, or server to help order a healthier meal.  Planning Your Eating Strategy  Clinical staff conducted group or individual video education with verbal and written material and guidebook.  Patient learns about the [place] and its benefit of reducing the risk of disease. The [place] does not focus on calories. Instead, it emphasizes high-quality, nutrient-rich foods. By knowing the characteristics of the foods, we choose, we can determine their calorie density and make informed decisions.  Targeting Your Nutrition Priorities  Clinical staff conducted group or individual video education with verbal and written material and guidebook.  Patient learns that lifestyle habits have a tremendous impact on disease risk and progression. This video provides eating and physical activity recommendations based on your personal health goals, such as reducing LDL cholesterol, losing weight, preventing or controlling type 2 diabetes, and reducing high blood pressure.  Vitamins and Minerals  Clinical staff conducted group or individual video education with verbal and written material and guidebook.  Patient learns different ways to obtain key vitamins and minerals, including through a recommended healthy diet. It is important to discuss all supplements you take with your doctor.   Healthy Mind-Set    Smoking Cessation  Clinical staff conducted group or individual video education with verbal and written material and guidebook.  Patient learns that cigarette smoking and tobacco addiction pose  a serious health risk which affects millions of people. Stopping smoking will significantly reduce the risk of heart disease, lung disease, and many forms of cancer. Recommended strategies for quitting are covered, including working with your doctor to develop a successful plan.  Culinary   Becoming a Financial trader conducted group or individual video education with verbal and written material and guidebook.  Patient learns that cooking at home can be healthy, cost-effective, quick, and puts them in control. Keys to cooking healthy recipes will include looking at your recipe, assessing your equipment needs, planning ahead, making it simple, choosing cost-effective seasonal ingredients, and limiting the use of added fats, salts, and sugars.  Cooking - Breakfast and Snacks  Clinical staff conducted group or individual video education with verbal and written material and guidebook.  Patient learns how important breakfast is to satiety and nutrition through the entire day. Recommendations include key foods to eat during breakfast to help stabilize blood sugar  levels and to prevent overeating at meals later in the day. Planning ahead is also a key component.  Cooking - Human resources officer conducted group or individual video education with verbal and written material and guidebook.  Patient learns eating strategies to improve overall health, including an approach to cook more at home. Recommendations include thinking of animal protein as a side on your plate rather than center stage and focusing instead on lower calorie dense options like vegetables, fruits, whole grains, and plant-based proteins, such as beans. Making sauces in large quantities to freeze for later and leaving the skin on your vegetables are also recommended to maximize your experience.  Cooking - Healthy Salads and Dressing Clinical staff conducted group or individual video education with verbal and written  material and guidebook.  Patient learns that vegetables, fruits, whole grains, and legumes are the foundations of the [place]. Recommendations include how to incorporate each of these in flavorful and healthy salads, and how to create homemade salad dressings. Proper handling of ingredients is also covered. Cooking - Soups and [employer] - Soups and Desserts Clinical staff conducted group or individual video education with verbal and written material and guidebook.  Patient learns that Pritikin soups and desserts make for easy, nutritious, and delicious snacks and meal components that are low in sodium, fat, sugar, and calorie density, while high in vitamins, minerals, and filling fiber. Recommendations include simple and healthy ideas for soups and desserts.   Overview     The Pritikin Solution Program Overview Clinical staff conducted group or individual video education with verbal and written material and guidebook.  Patient learns that the results of the [place] Program have been documented in more than 100 articles published in peer-reviewed journals, and the benefits include reducing risk factors for (and, in some cases, even reversing) high cholesterol, high blood pressure, type 2 diabetes, obesity, and more! An overview of the three key pillars of the Pritikin Program will be covered: eating well, doing regular exercise, and having a healthy mind-set.  WORKSHOPS  Exercise: Exercise Basics: Building Your Action Plan Clinical staff led group instruction and group discussion with PowerPoint presentation and patient guidebook. To enhance the learning environment the use of posters, models and videos may be added. At the conclusion of this workshop, patients will comprehend the difference between physical activity and exercise, as well as the benefits of incorporating both, into their routine. Patients will understand the FITT (Frequency, Intensity, Time, and Type) principle  and how to use it to build an exercise action plan. In addition, safety concerns and other considerations for exercise and cardiac rehab will be addressed by the presenter. The purpose of this lesson is to promote a comprehensive and effective weekly exercise routine in order to improve patients' overall level of fitness.   Managing Heart Disease: Your Path to a Healthier Heart Clinical staff led group instruction and group discussion with PowerPoint presentation and patient guidebook. To enhance the learning environment the use of posters, models and videos may be added.At the conclusion of this workshop, patients will understand the anatomy and physiology of the heart. Additionally, they will understand how Pritikin's three pillars impact the risk factors, the progression, and the management of heart disease.  The purpose of this lesson is to provide a high-level overview of the heart, heart disease, and how the Pritikin lifestyle positively impacts risk factors.  Exercise Biomechanics Clinical staff led group instruction and group discussion with PowerPoint presentation and  patient guidebook. To enhance the learning environment the use of posters, models and videos may be added. Patients will learn how the structural parts of their bodies function and how these functions impact their daily activities, movement, and exercise. Patients will learn how to promote a neutral spine, learn how to manage pain, and identify ways to improve their physical movement in order to promote healthy living. The purpose of this lesson is to expose patients to common physical limitations that impact physical activity. Participants will learn practical ways to adapt and manage aches and pains, and to minimize their effect on regular exercise. Patients will learn how to maintain good posture while sitting, walking, and lifting.  Balance Training and Fall Prevention  Clinical staff led group instruction and group  discussion with PowerPoint presentation and patient guidebook. To enhance the learning environment the use of posters, models and videos may be added. At the conclusion of this workshop, patients will understand the importance of their sensorimotor skills (vision, proprioception, and the vestibular system) in maintaining their ability to balance as they age. Patients will apply a variety of balancing exercises that are appropriate for their current level of function. Patients will understand the common causes for poor balance, possible solutions to these problems, and ways to modify their physical environment in order to minimize their fall risk. The purpose of this lesson is to teach patients about the importance of maintaining balance as they age and ways to minimize their risk of falling.  WORKSHOPS   Nutrition:  Fueling a Scientist, research (physical sciences) led group instruction and group discussion with PowerPoint presentation and patient guidebook. To enhance the learning environment the use of posters, models and videos may be added. Patients will review the foundational principles of the [place] and understand what constitutes a serving size in each of the food groups. Patients will also learn Pritikin-friendly foods that are better choices when away from home and review make-ahead meal and snack options. Calorie density will be reviewed and applied to three nutrition priorities: weight maintenance, weight loss, and weight gain. The purpose of this lesson is to reinforce (in a group setting) the key concepts around what patients are recommended to eat and how to apply these guidelines when away from home by planning and selecting Pritikin-friendly options. Patients will understand how calorie density may be adjusted for different weight management goals.  Mindful Eating  Clinical staff led group instruction and group discussion with PowerPoint presentation and patient guidebook. To enhance  the learning environment the use of posters, models and videos may be added. Patients will briefly review the concepts of the [place] and the importance of low-calorie dense foods. The concept of mindful eating will be introduced as well as the importance of paying attention to internal hunger signals. Triggers for non-hunger eating and techniques for dealing with triggers will be explored. The purpose of this lesson is to provide patients with the opportunity to review the basic principles of the [place], discuss the value of eating mindfully and how to measure internal cues of hunger and fullness using the Hunger Scale. Patients will also discuss reasons for non-hunger eating and learn strategies to use for controlling emotional eating.  Targeting Your Nutrition Priorities Clinical staff led group instruction and group discussion with PowerPoint presentation and patient guidebook. To enhance the learning environment the use of posters, models and videos may be added. Patients will learn how to determine their genetic susceptibility to disease by reviewing  their family history. Patients will gain insight into the importance of diet as part of an overall healthy lifestyle in mitigating the impact of genetics and other environmental insults. The purpose of this lesson is to provide patients with the opportunity to assess their personal nutrition priorities by looking at their family history, their own health history and current risk factors. Patients will also be able to discuss ways of prioritizing and modifying the Pritikin Eating Plan for their highest risk areas  Menu  Clinical staff led group instruction and group discussion with PowerPoint presentation and patient guidebook. To enhance the learning environment the use of posters, models and videos may be added. Using menus brought in from [employer], or printed from [employer], patients will apply the Pritikin dining out  guidelines that were presented in the [employer] video. Patients will also be able to practice these guidelines in a variety of provided scenarios. The purpose of this lesson is to provide patients with the opportunity to practice hands-on learning of the Pritikin Dining Out guidelines with actual menus and practice scenarios.  Label Reading Clinical staff led group instruction and group discussion with PowerPoint presentation and patient guidebook. To enhance the learning environment the use of posters, models and videos may be added. Patients will review and discuss the Pritikin label reading guidelines presented in Pritikin's Label Reading Educational series video. Using fool labels brought in from local grocery stores and markets, patients will apply the label reading guidelines and determine if the packaged food meet the Pritikin guidelines. The purpose of this lesson is to provide patients with the opportunity to review, discuss, and practice hands-on learning of the Pritikin Label Reading guidelines with actual packaged food labels. Cooking School  Pritikin's [employer] are designed to teach patients ways to prepare quick, simple, and affordable recipes at home. The importance of nutrition's role in chronic disease risk reduction is reflected in its emphasis in the overall Pritikin program. By learning how to prepare essential core Pritikin Eating Plan recipes, patients will increase control over what they eat; be able to customize the flavor of foods without the use of added salt, sugar, or fat; and improve the quality of the food they consume. By learning a set of core recipes which are easily assembled, quickly prepared, and affordable, patients are more likely to prepare more healthy foods at home. These workshops focus on convenient breakfasts, simple entres, side dishes, and desserts which can be prepared with minimal effort and are consistent with nutrition  recommendations for cardiovascular risk reduction. Cooking [employer] are taught by a Armed forces logistics/support/administrative officer (RD) who has been trained by the [employer]. The chef or RD has a clear understanding of the importance of minimizing - if not completely eliminating - added fat, sugar, and sodium in recipes. Throughout the series of Cooking School Workshop sessions, patients will learn about healthy ingredients and efficient methods of cooking to build confidence in their capability to prepare    Cooking School weekly topics:  Adding Flavor- Sodium-Free  Fast and Healthy Breakfasts  Powerhouse Plant-Based Proteins  Satisfying Salads and Dressings  Simple Sides and Sauces  International Cuisine-Spotlight on the [employer] Zones  Delicious Desserts  Savory Soups  [employer] - Meals in a Astronomer Appetizers and Snacks  Comforting Weekend Breakfasts  One-Pot Wonders   Fast Evening Meals  Landscape architect Your Pritikin Plate  WORKSHOPS   Healthy Mindset (Psychosocial):  Focused Goals,  Sustainable Changes Clinical staff led group instruction and group discussion with PowerPoint presentation and patient guidebook. To enhance the learning environment the use of posters, models and videos may be added. Patients will be able to apply effective goal setting strategies to establish at least one personal goal, and then take consistent, meaningful action toward that goal. They will learn to identify common barriers to achieving personal goals and develop strategies to overcome them. Patients will also gain an understanding of how our mind-set can impact our ability to achieve goals and the importance of cultivating a positive and growth-oriented mind-set. The purpose of this lesson is to provide patients with a deeper understanding of how to set and achieve personal goals, as well as the tools and strategies needed to overcome common obstacles which may arise along  the way.  From Head to Heart: The Power of a Healthy Outlook  Clinical staff led group instruction and group discussion with PowerPoint presentation and patient guidebook. To enhance the learning environment the use of posters, models and videos may be added. Patients will be able to recognize and describe the impact of emotions and mood on physical health. They will discover the importance of self-care and explore self-care practices which may work for them. Patients will also learn how to utilize the 4 C's to cultivate a healthier outlook and better manage stress and challenges. The purpose of this lesson is to demonstrate to patients how a healthy outlook is an essential part of maintaining good health, especially as they continue their cardiac rehab journey.  Healthy Sleep for a Healthy Heart Clinical staff led group instruction and group discussion with PowerPoint presentation and patient guidebook. To enhance the learning environment the use of posters, models and videos may be added. At the conclusion of this workshop, patients will be able to demonstrate knowledge of the importance of sleep to overall health, well-being, and quality of life. They will understand the symptoms of, and treatments for, common sleep disorders. Patients will also be able to identify daytime and nighttime behaviors which impact sleep, and they will be able to apply these tools to help manage sleep-related challenges. The purpose of this lesson is to provide patients with a general overview of sleep and outline the importance of quality sleep. Patients will learn about a few of the most common sleep disorders. Patients will also be introduced to the concept of "sleep hygiene," and discover ways to self-manage certain sleeping problems through simple daily behavior changes. Finally, the workshop will motivate patients by clarifying the links between quality sleep and their goals of heart-healthy living.   Recognizing and  Reducing Stress Clinical staff led group instruction and group discussion with PowerPoint presentation and patient guidebook. To enhance the learning environment the use of posters, models and videos may be added. At the conclusion of this workshop, patients will be able to understand the types of stress reactions, differentiate between acute and chronic stress, and recognize the impact that chronic stress has on their health. They will also be able to apply different coping mechanisms, such as reframing negative self-talk. Patients will have the opportunity to practice a variety of stress management techniques, such as deep abdominal breathing, progressive muscle relaxation, and/or guided imagery.  The purpose of this lesson is to educate patients on the role of stress in their lives and to provide healthy techniques for coping with it.  Learning Barriers/Preferences:  Learning Barriers/Preferences - 08/10/22 1027       Learning Barriers/Preferences  Learning Barriers Sight   wears glasses   Learning Preferences Audio;Computer/Internet;Group Instruction;Individual Instruction;Skilled Demonstration;Verbal Instruction;Video;Written Material;Pictoral             Education Topics:  Knowledge Questionnaire Score:  Knowledge Questionnaire Score - 08/10/22 1201       Knowledge Questionnaire Score   Pre Score 18/24             Core Components/Risk Factors/Patient Goals at Admission:  Personal Goals and Risk Factors at Admission - 08/10/22 1028       Core Components/Risk Factors/Patient Goals on Admission   Hypertension Yes    Intervention Provide education on lifestyle modifcations including regular physical activity/exercise, weight management, moderate sodium restriction and increased consumption of fresh fruit, vegetables, and low fat dairy, alcohol moderation, and smoking cessation.;Monitor prescription use compliance.    Expected Outcomes Long Term: Maintenance of blood pressure  at goal levels.;Short Term: Continued assessment and intervention until BP is < 140/65mm HG in hypertensive participants. < 130/73mm HG in hypertensive participants with diabetes, heart failure or chronic kidney disease.    Lipids Yes    Intervention Provide education and support for participant on nutrition & aerobic/resistive exercise along with prescribed medications to achieve LDL 70mg , HDL >40mg .    Expected Outcomes Short Term: Participant states understanding of desired cholesterol values and is compliant with medications prescribed. Participant is following exercise prescription and nutrition guidelines.;Long Term: Cholesterol controlled with medications as prescribed, with individualized exercise RX and with personalized nutrition plan. Value goals: LDL < 70mg , HDL > 40 mg.             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 Introduction to [facility] education/ intensive cardiac rehab. Initial orientation packet reviewed with patient.                Comments: Participant attended orientation for the cardiac rehabilitation program on  08/10/2022  to perform initial intake and exercise walk test. Patient introduced to the Pritikin Program education and orientation packet was reviewed. Completed 6-minute walk test, measurements, initial ITP, and exercise prescription. Vital signs stable. Telemetry-normal sinus rhythm, asymptomatic.   Service time was from 0945 to 1145.  Jonna Coup, MS 08/10/2022 12:04 PM

## 2022-08-10 NOTE — Progress Notes (Signed)
Cardiac Rehab Medication Review   Does the patient  feel that his/her medications are working for him/her?  YES  Has the patient been experiencing any side effects to the medications prescribed?  NO  Does the patient measure his/her own blood pressure or blood glucose at home?  YES    Does the patient have any problems obtaining medications due to transportation or finances?   NO  Understanding of regimen: excellent Understanding of indications: excellent Potential of compliance: excellent    Comments: Bryan Craig has a great understanding of his medications. He checks his BP at home every morning.     Jonna Coup, MS 08/10/2022 10:35 AM

## 2022-08-19 ENCOUNTER — Encounter (HOSPITAL_COMMUNITY)
Admission: RE | Admit: 2022-08-19 | Discharge: 2022-08-19 | Disposition: A | Discharge status: Home / Self Care | Payer: Medicare Other | Source: Ambulatory Visit | Attending: Cardiology | Admitting: Cardiology

## 2022-08-19 DIAGNOSIS — I214 Non-ST elevation (NSTEMI) myocardial infarction: Secondary | ICD-10-CM

## 2022-08-19 NOTE — Progress Notes (Signed)
Daily Session Note  Patient Details  Name: Bryan Craig MRN: [id number] Date of Birth: 1950-05-28 Referring Provider:   Flowsheet Row INTENSIVE CARDIAC REHAB ORIENT from 08/10/2022 in [facility]  Referring Provider Gypsy Balsam, MD       Encounter Date: 08/19/2022  Check In:  Session Check In - 08/19/22 1506       Check-In   Supervising physician immediately available to respond to emergencies [facility] MD immediately available    Physician(s) Bernadene Person, NP    Location [facility]    Staff Present Valinda Party, MS, Exercise Physiologist;Johnny Hale Bogus, MS, Exercise Physiologist;Olinty Peggye Pitt, MS, ACSM-CEP, Exercise Physiologist;Duaine Radin, RN, Marton Redwood, MS, ACSM-CEP, CCRP, Exercise Physiologist    Virtual Visit No    Medication changes reported     No    Fall or balance concerns reported    No    Tobacco Cessation No Change    Warm-up and Cool-down Performed as group-led instruction    Resistance Training Performed No    VAD Patient? No    PAD/SET Patient? No      Pain Assessment   Currently in Pain? No/denies    Pain Score 0-No pain    Multiple Pain Sites No             Capillary Blood Glucose: No results found for this or any previous visit (from the past 24 hour(s)).   Exercise Prescription Changes - 08/19/22 1450       Response to Exercise   Blood Pressure (Admit) 132/80    Blood Pressure (Exercise) 144/80    Blood Pressure (Exit) 118/76    Heart Rate (Admit) 67 bpm    Heart Rate (Exercise) 107 bpm    Heart Rate (Exit) 76 bpm    Rating of Perceived Exertion (Exercise) 11    Symptoms None    Comments Off to a great start with exercise.    Duration Continue with 30 min of aerobic exercise without signs/symptoms of physical distress.    Intensity THRR unchanged      Progression   Progression Continue to progress workloads to maintain intensity without  signs/symptoms of physical distress.    Average METs 3.5      Resistance Training   Training Prescription No   Relaxation day, no weights     Interval Training   Interval Training No      Bike   Level 2    Watts 30    Minutes 15    METs 3.2      Recumbant Elliptical   Level 2    RPM 56    Watts 70    Minutes 15    METs 3.9             Social History   Tobacco Use  Smoking Status Never  Smokeless Tobacco Never    Goals Met:  Exercise tolerated well No report of concerns or symptoms today  Goals Unmet:  {[facility] GOALS [phone]}  Comments: ***   {[facility] MEDICAL [phone]}

## 2022-08-21 ENCOUNTER — Encounter (HOSPITAL_COMMUNITY)
Admission: RE | Admit: 2022-08-21 | Discharge: 2022-08-21 | Disposition: A | Discharge status: Home / Self Care | Payer: Medicare Other | Source: Ambulatory Visit | Attending: Cardiology | Admitting: Cardiology

## 2022-08-21 DIAGNOSIS — I214 Non-ST elevation (NSTEMI) myocardial infarction: Secondary | ICD-10-CM | POA: Diagnosis not present

## 2022-08-24 ENCOUNTER — Encounter (HOSPITAL_COMMUNITY)
Admission: RE | Admit: 2022-08-24 | Discharge: 2022-08-24 | Disposition: A | Discharge status: Home / Self Care | Payer: Medicare Other | Source: Ambulatory Visit | Attending: Cardiology | Admitting: Cardiology

## 2022-08-24 DIAGNOSIS — I214 Non-ST elevation (NSTEMI) myocardial infarction: Secondary | ICD-10-CM

## 2022-08-26 ENCOUNTER — Encounter (HOSPITAL_COMMUNITY)
Admission: RE | Admit: 2022-08-26 | Discharge: 2022-08-26 | Disposition: A | Discharge status: Home / Self Care | Payer: Medicare Other | Source: Ambulatory Visit | Attending: Cardiology | Admitting: Cardiology

## 2022-08-26 DIAGNOSIS — I214 Non-ST elevation (NSTEMI) myocardial infarction: Secondary | ICD-10-CM | POA: Insufficient documentation

## 2022-08-28 ENCOUNTER — Encounter (HOSPITAL_COMMUNITY)
Admission: RE | Admit: 2022-08-28 | Discharge: 2022-08-28 | Disposition: A | Discharge status: Home / Self Care | Payer: Medicare Other | Source: Ambulatory Visit | Attending: Cardiology | Admitting: Cardiology

## 2022-08-28 DIAGNOSIS — I214 Non-ST elevation (NSTEMI) myocardial infarction: Secondary | ICD-10-CM | POA: Diagnosis not present

## 2022-08-31 ENCOUNTER — Encounter (HOSPITAL_COMMUNITY)
Admission: RE | Admit: 2022-08-31 | Discharge: 2022-08-31 | Disposition: A | Discharge status: Home / Self Care | Payer: Medicare Other | Source: Ambulatory Visit | Attending: Cardiology | Admitting: Cardiology

## 2022-08-31 DIAGNOSIS — I214 Non-ST elevation (NSTEMI) myocardial infarction: Secondary | ICD-10-CM

## 2022-09-02 ENCOUNTER — Encounter (HOSPITAL_COMMUNITY)
Admission: RE | Admit: 2022-09-02 | Discharge: 2022-09-02 | Disposition: A | Discharge status: Home / Self Care | Payer: Medicare Other | Source: Ambulatory Visit | Attending: Cardiology | Admitting: Cardiology

## 2022-09-02 DIAGNOSIS — I214 Non-ST elevation (NSTEMI) myocardial infarction: Secondary | ICD-10-CM | POA: Diagnosis not present

## 2022-09-02 NOTE — Progress Notes (Signed)
Cardiac Individual Treatment Plan  Patient Details  Name: Bryan Craig MRN: [id number] Date of Birth: 1951-03-06 Referring Provider:   Flowsheet Row INTENSIVE CARDIAC REHAB ORIENT from 08/10/2022 in [facility]  Referring Provider Gypsy Balsam, MD       Initial Encounter Date:  Flowsheet Row INTENSIVE CARDIAC REHAB ORIENT from 08/10/2022 in [facility]  Date 08/10/22       Visit Diagnosis: NSTEMI (non-ST elevated myocardial infarction) [facility])  Patient's Home Medications on Admission:  Current Outpatient Medications:    aspirin EC 81 MG tablet, Take 1 tablet (81 mg total) by mouth daily. Swallow whole., Disp: 90 tablet, Rfl: 3   atorvastatin (LIPITOR) 80 MG tablet, Take 1 tablet (80 mg total) by mouth at bedtime., Disp: 90 tablet, Rfl: 1   Difluprednate 0.05 % EMUL, Administer 1 drop into the left eye daily., Disp: , Rfl:    HYDROcodone-acetaminophen (NORCO/VICODIN) 5-325 MG tablet, Take 1 tablet by mouth every 6 (six) hours as needed for severe pain., Disp: 12 tablet, Rfl: 0   metoprolol succinate (TOPROL-XL) 50 MG 24 hr tablet, Take 1 tablet (50 mg total) by mouth daily., Disp: 90 tablet, Rfl: 3   ondansetron (ZOFRAN-ODT) 4 MG disintegrating tablet, Take 1 tablet (4 mg total) by mouth every 8 (eight) hours as needed for nausea or vomiting., Disp: 20 tablet, Rfl: 0   Risankizumab-rzaa (SKYRIZI) 150 MG/ML SOSY, Inject into the skin once a week., Disp: , Rfl:    telmisartan (MICARDIS) 80 MG tablet, Take 1 tablet (80 mg total) by mouth daily., Disp: 90 tablet, Rfl: 3   ticagrelor (BRILINTA) 90 MG TABS tablet, Take 1 tablet (90 mg total) by mouth 2 (two) times daily., Disp: 180 tablet, Rfl: 3   triamcinolone cream (KENALOG) 0.1 %, Apply 1 Application topically 2 (two) times daily. (Patient taking differently: Apply 1 Application topically 2 (two) times daily as needed  (irritation).), Disp: 30 g, Rfl: 0  Past Medical History: Past Medical History:  Diagnosis Date   Hyperlipidemia    Hypertension    Kidney stone    Retinal detachment     Tobacco Use: Social History   Tobacco Use  Smoking Status Never  Smokeless Tobacco Never    Labs: Review Flowsheet  More data exists      Latest Ref Rng & Units 08/04/2019 11/25/2021 02/17/2022 07/21/2022 07/22/2022  Labs for ITP Cardiac and Pulmonary Rehab  Cholestrol 0 - 200 mg/dL 782  956  - 213  -  LDL (calc) 0 - 99 mg/dL 086  578  - - -  Direct LDL mg/dL - - - 469.6  -  HDL-C >29.52 mg/dL 84.13  24.40  - 10.27  -  Trlycerides 0.0 - 149.0 mg/dL 253.6  644.0  - 347.4  -  Hemoglobin A1c 4.8 - 5.6 % 5.5  - 5.9  - 5.8     Capillary Blood Glucose: No results found for: "GLUCAP"   Exercise Target Goals: Exercise Program Goal: Individual exercise prescription set using results from initial 6 min walk test and THRR while considering  patient's activity barriers and safety.   Exercise Prescription Goal: Initial exercise prescription builds to 30-45 minutes a day of aerobic activity, 2-3 days per week.  Home exercise guidelines will be given to patient during program as part of exercise prescription that the participant will acknowledge.  Activity Barriers & Risk Stratification:  Activity Barriers &  Cardiac Risk Stratification - 08/10/22 1157       Activity Barriers & Cardiac Risk Stratification   Activity Barriers None    Cardiac Risk Stratification High   <5 METS on            6 Minute Walk:  6 Minute Walk     Row Name 08/10/22 1156         6 Minute Walk   Phase Initial     Distance 1266 feet     Walk Time 6 minutes     # of Rest Breaks 0     MPH 2.79     METS 2.39     RPE 6     Perceived Dyspnea  0     VO2 Peak 9.77     Symptoms No     Resting HR 80 bpm     Resting BP 108/78     Resting Oxygen Saturation  96 %     Exercise Oxygen Saturation  during 6 min walk 98 %     Max  Ex. HR 101 bpm     Max Ex. BP 130/82     2 Minute Post BP 116/76              Oxygen Initial Assessment:   Oxygen Re-Evaluation:   Oxygen Discharge (Final Oxygen Re-Evaluation):   Initial Exercise Prescription:  Initial Exercise Prescription - 08/10/22 1100       Date of Initial Exercise RX and Referring Provider   Date 08/10/22    Referring Provider Gypsy Balsam, MD    Expected Discharge Date 10/23/22      Bike   Level 2    Watts 60    Minutes 15    METs 2.5      Recumbant Elliptical   Level 2    RPM 70    Watts 90    Minutes 15    METs 2.5      Prescription Details   Frequency (times per week) 3    Duration Progress to 30 minutes of continuous aerobic without signs/symptoms of physical distress      Intensity   THRR 40-80% of Max Heartrate 59-118    Ratings of Perceived Exertion 11-13    Perceived Dyspnea 0-4      Progression   Progression Continue progressive overload as per policy without signs/symptoms or physical distress.      Resistance Training   Training Prescription Yes    Weight 3    Reps 10-15             Perform Capillary Blood Glucose checks as needed.  Exercise Prescription Changes:   Exercise Prescription Changes     Row Name 08/19/22 1450 08/31/22 1630           Response to Exercise   Blood Pressure (Admit) 132/80 130/78      Blood Pressure (Exercise) 144/80 148/78      Blood Pressure (Exit) 118/76 --      Heart Rate (Admit) 67 bpm 56 bpm      Heart Rate (Exercise) 107 bpm 109 bpm      Heart Rate (Exit) 76 bpm 64 bpm      Rating of Perceived Exertion (Exercise) 11 11      Symptoms None None      Comments Off to a great start with exercise. Reviewed MET's      Duration Continue with 30 min of aerobic exercise without signs/symptoms of physical  distress. Continue with 30 min of aerobic exercise without signs/symptoms of physical distress.      Intensity THRR unchanged THRR unchanged        Progression    Progression Continue to progress workloads to maintain intensity without signs/symptoms of physical distress. Continue to progress workloads to maintain intensity without signs/symptoms of physical distress.      Average METs 3.5 3.55        Resistance Training   Training Prescription No  Relaxation day, no weights No  Relaxation day, no weights        Interval Training   Interval Training No No        Bike   Level 2 2      Watts 30 37      Minutes 15 66      METs 3.2 3.4        Recumbant Elliptical   Level 2 2      RPM 56 60      Watts 70 77      Minutes 15 15      METs 3.9 3.7               Exercise Comments:   Exercise Comments     Row Name 08/19/22 1554 08/31/22 1630         Exercise Comments Patient tolerated 1st session of exercise well without symptoms. Reviewed MET's with pt today. Pt tolerated exercise well with an average MET level of 3.55. Pt is progressing well so far will continue to monitor and progress as tolerated without sign or symptom               Exercise Goals and Review:   Exercise Goals     Row Name 08/10/22 1027             Exercise Goals   Increase Physical Activity Yes       Intervention Provide advice, education, support and counseling about physical activity/exercise needs.;Develop an individualized exercise prescription for aerobic and resistive training based on initial evaluation findings, risk stratification, comorbidities and participant's personal goals.       Expected Outcomes Short Term: Attend rehab on a regular basis to increase amount of physical activity.;Long Term: Exercising regularly at least 3-5 days a week.;Long Term: Add in home exercise to make exercise part of routine and to increase amount of physical activity.       Increase Strength and Stamina Yes       Intervention Provide advice, education, support and counseling about physical activity/exercise needs.;Develop an individualized exercise prescription for  aerobic and resistive training based on initial evaluation findings, risk stratification, comorbidities and participant's personal goals.       Expected Outcomes Short Term: Increase workloads from initial exercise prescription for resistance, speed, and METs.;Short Term: Perform resistance training exercises routinely during rehab and add in resistance training at home;Long Term: Improve cardiorespiratory fitness, muscular endurance and strength as measured by increased METs and functional capacity ( )       Able to understand and use rate of perceived exertion (RPE) scale Yes       Intervention Provide education and explanation on how to use RPE scale       Expected Outcomes Short Term: Able to use RPE daily in rehab to express subjective intensity level;Long Term:  Able to use RPE to guide intensity level when exercising independently       Knowledge and understanding of Target Heart Rate Range (THRR) Yes  Intervention Provide education and explanation of THRR including how the numbers were predicted and where they are located for reference       Expected Outcomes Short Term: Able to use daily as guideline for intensity in rehab;Long Term: Able to use THRR to govern intensity when exercising independently;Short Term: Able to state/look up THRR       Understanding of Exercise Prescription Yes       Intervention Provide education, explanation, and written materials on patient's individual exercise prescription       Expected Outcomes Long Term: Able to explain home exercise prescription to exercise independently;Short Term: Able to explain program exercise prescription                Exercise Goals Re-Evaluation :  Exercise Goals Re-Evaluation     Row Name 08/19/22 1554             Exercise Goal Re-Evaluation   Exercise Goals Review Increase Physical Activity;Able to understand and use rate of perceived exertion (RPE) scale;Increase Strength and Stamina       Comments Patient  able to understand and use RPE scale appropriately.       Expected Outcomes Progress workloads as tolerated to help increase strength and stamina.                Discharge Exercise Prescription (Final Exercise Prescription Changes):  Exercise Prescription Changes - 08/31/22 1630       Response to Exercise   Blood Pressure (Admit) 130/78    Blood Pressure (Exercise) 148/78    Heart Rate (Admit) 56 bpm    Heart Rate (Exercise) 109 bpm    Heart Rate (Exit) 64 bpm    Rating of Perceived Exertion (Exercise) 11    Symptoms None    Comments Reviewed MET's    Duration Continue with 30 min of aerobic exercise without signs/symptoms of physical distress.    Intensity THRR unchanged      Progression   Progression Continue to progress workloads to maintain intensity without signs/symptoms of physical distress.    Average METs 3.55      Resistance Training   Training Prescription No   Relaxation day, no weights     Interval Training   Interval Training No      Bike   Level 2    Watts 37    Minutes 66    METs 3.4      Recumbant Elliptical   Level 2    RPM 60    Watts 77    Minutes 15    METs 3.7             Nutrition:  Target Goals: Understanding of nutrition guidelines, daily intake of sodium 1500mg , cholesterol 200mg , calories 30% from fat and 7% or less from saturated fats, daily to have 5 or more servings of fruits and vegetables.  Biometrics:  Pre Biometrics - 08/10/22 1024       Pre Biometrics   Waist Circumference 34.5 inches    Hip Circumference 39 inches    Waist to Hip Ratio 0.88 %    Triceps Skinfold 12 mm    % Body Fat 23.8 %    Grip Strength 15 kg    Flexibility 36 in    Single Leg Stand 4.68 seconds              Nutrition Therapy Plan and Nutrition Goals:  Nutrition Therapy & Goals - 08/20/22 0833       Nutrition  Therapy   Diet Heart Healthy Diet    Drug/Food Interactions Statins/Certain Fruits      Personal Nutrition Goals    Nutrition Goal Patient to identify strategies for reducing cardiovascular risk by attending the [employer]rit[employer]in education and nutrition series weekly.    Personal Goal #2 Patient to improve diet quality by using the plate method as a guide for meal planning to include lean protein/plant protein, fruits, vegetables, whole grains, nonfat dairy as part of a well-balanced diet.    Personal Goal #3 Patient to reduce sodium to 1500mg  per day    Comments Patient will benefit from participation in intensive cardiac rehab for nutrition, exercise, and lifestyle modification.      Intervention Plan   Intervention Prescribe, educate and counsel regarding individualized specific dietary modifications aiming towards targeted core components such as weight, hypertension, lipid management, diabetes, heart failure and other comorbidities.;Nutrition handout(s) given to patient.    Expected Outcomes Short Term Goal: Understand basic principles of dietary content, such as calories, fat, sodium, cholesterol and nutrients.;Long Term Goal: Adherence to prescribed nutrition plan.             Nutrition Assessments:  Nutrition Assessments - 08/21/22 0958       Rate Your Plate Scores   Pre Score 55            MEDIFICTS Score Key: ?70 Need to make dietary changes  40-70 Heart Healthy Diet ? 40 Therapeutic Level Cholesterol Diet   Flowsheet Row INTENSIVE CARDIAC REHAB from 08/19/2022 in [facility] for Heart, Vascular, & Lung Health  Picture Your Plate Total Score on Admission 55      Picture Your Plate Scores: <19 Unhealthy dietary pattern with much room for improvement. 41-50 Dietary pattern unlikely to meet recommendations for good health and room for improvement. 51-60 More healthful dietary pattern, with some room for improvement.  >60 Healthy dietary pattern, although there may be some specific behaviors that could be improved.    Nutrition Goals Re-Evaluation:  Nutrition Goals  Re-Evaluation     Row Name 08/20/22 [phone]             Goals   Current Weight 177 lb 0.5 oz (80.3 kg)       Comment A1c 5.8, lipoproteinA 256.8, LDL 131, triglycerides 205, total cholesterol 225       Expected Outcome Patient will benefit from participation in intensive cardiac rehab for nutrition, exercise, and lifestyle modification.                Nutrition Goals Re-Evaluation:  Nutrition Goals Re-Evaluation     Row Name 08/20/22 [phone]             Goals   Current Weight 177 lb 0.5 oz (80.3 kg)       Comment A1c 5.8, lipoproteinA 256.8, LDL 131, triglycerides 205, total cholesterol 225       Expected Outcome Patient will benefit from participation in intensive cardiac rehab for nutrition, exercise, and lifestyle modification.                Nutrition Goals Discharge (Final Nutrition Goals Re-Evaluation):  Nutrition Goals Re-Evaluation - 08/20/22 2130       Goals   Current Weight 177 lb 0.5 oz (80.3 kg)    Comment A1c 5.8, lipoproteinA 256.8, LDL 131, triglycerides 205, total cholesterol 225    Expected Outcome Patient will benefit from participation in intensive cardiac rehab for nutrition, exercise, and lifestyle modification.  Psychosocial: Target Goals: Acknowledge presence or absence of significant depression and/or stress, maximize coping skills, provide positive support system. Participant is able to verbalize types and ability to use techniques and skills needed for reducing stress and depression.  Initial Review & Psychosocial Screening:  Initial Psych Review & Screening - 08/10/22 1026       Initial Review   Current issues with None Identified      Family Dynamics   Good Support System? Yes   Gordy has his wife and 3 daughters for support     Barriers   Psychosocial barriers to participate in program There are no identifiable barriers or psychosocial needs.      Screening Interventions   Interventions Encouraged to  exercise;Provide feedback about the scores to participant    Expected Outcomes Short Term goal: Identification and review with participant of any Quality of Life or Depression concerns found by scoring the questionnaire.;Long Term goal: The participant improves quality of Life and PHQ9 Scores as seen by post scores and/or verbalization of changes             Quality of Life Scores:  Quality of Life - 08/10/22 1201       Quality of Life   Select Quality of Life      Quality of Life Scores   Health/Function Pre 28.43 %    Socioeconomic Pre 27 %    Psych/Spiritual Pre 28.79 %    Family Pre 30 %    GLOBAL Pre 28.44 %            Scores of 19 and below usually indicate a poorer quality of life in these areas.  A difference of  2-3 points is a clinically meaningful difference.  A difference of 2-3 points in the total score of the Quality of Life Index has been associated with significant improvement in overall quality of life, self-image, physical symptoms, and general health in studies assessing change in quality of life.  PHQ-9: Review Flowsheet       08/10/2022 11/03/2021 06/07/2018  Depression screen PHQ 2/9  Decreased Interest 0 0 0  Down, Depressed, Hopeless 0 0 0  PHQ - 2 Score 0 0 0  Altered sleeping 0 - -  Tired, decreased energy 2 - -  Change in appetite 0 - -  Feeling bad or failure about yourself  0 - -  Trouble concentrating 0 - -  Moving slowly or fidgety/restless 0 - -  Suicidal thoughts 0 - -  PHQ-9 Score 2 - -  Difficult doing work/chores Not difficult at all - -   Interpretation of Total Score  Total Score Depression Severity:  1-4 = Minimal depression, 5-9 = Mild depression, 10-14 = Moderate depression, 15-19 = Moderately severe depression, 20-27 = Severe depression   Psychosocial Evaluation and Intervention:   Psychosocial Re-Evaluation:  Psychosocial Re-Evaluation     Row Name 08/20/22 0818 09/02/22 1741           Psychosocial Re-Evaluation    Current issues with None Identified None Identified      Interventions Encouraged to attend Cardiac Rehabilitation for the exercise Encouraged to attend Cardiac Rehabilitation for the exercise      Continue Psychosocial Services  No Follow up required No Follow up required               Psychosocial Discharge (Final Psychosocial Re-Evaluation):  Psychosocial Re-Evaluation - 09/02/22 1741       Psychosocial Re-Evaluation   Current issues with  None Identified    Interventions Encouraged to attend Cardiac Rehabilitation for the exercise    Continue Psychosocial Services  No Follow up required             Vocational Rehabilitation: Provide vocational rehab assistance to qualifying candidates.   Vocational Rehab Evaluation & Intervention:  Vocational Rehab - 08/10/22 1027       Initial Vocational Rehab Evaluation & Intervention   Assessment shows need for Vocational Rehabilitation No   Myshon is retired            Education: Education Goals: Education classes will be provided on a weekly basis, covering required topics. Participant will state understanding/return demonstration of topics presented.    Education     Row Name 08/19/22 1600     Education   Cardiac Education Topics Pritikin   Customer service manager   Weekly Topic Comforting Weekend Breakfasts   Instruction Review Code 1- Verbalizes Understanding   Class Start Time 1355   Class Stop Time 1442   Class Time Calculation (min) 47 min    Row Name 08/21/22 1500     Education   Cardiac Education Topics Pritikin   Licensed conveyancer Nutrition   Nutrition Dining Out - Part 1   Instruction Review Code 1- Verbalizes Understanding   Class Start Time 1400   Class Stop Time 1445   Class Time Calculation (min) 45 min    Row Name 08/24/22 1400     Education   Cardiac Education Topics Pritikin     Core Videos    Educator Dietitian   Select Nutrition   Nutrition Facts on Fat   Instruction Review Code 1- Verbalizes Understanding   Class Start Time 1400   Class Stop Time 1439   Class Time Calculation (min) 39 min    Row Name 08/26/22 1500     Education   Cardiac Education Topics Pritikin   Customer service manager   Weekly Topic Fast Evening Meals   Instruction Review Code 1- Verbalizes Understanding   Class Start Time 1400   Class Stop Time 1440   Class Time Calculation (min) 40 min    Row Name 08/28/22 1500     Education   Cardiac Education Topics Pritikin   Licensed conveyancer Nutrition   Nutrition Vitamins and Minerals   Instruction Review Code 1- Verbalizes Understanding   Class Start Time 1400   Class Stop Time 1440   Class Time Calculation (min) 40 min    Row Name 08/31/22 1700     Education   Cardiac Education Topics Pritikin   [employer]     Workshops   Educator Exercise Physiologist   Select Psychosocial   Psychosocial Workshop Healthy Sleep for a Healthy Heart   Instruction Review Code 1- Verbalizes Understanding   Class Start Time 1405   Class Stop Time 1502   Class Time Calculation (min) 57 min    Row Name 09/02/22 1500     Education   Cardiac Education Topics Pritikin   Customer service manager   Weekly Topic International Cuisine- Spotlight on the [employer] Zones   Instruction Review Code 1- Verbalizes Understanding   Class  Start Time 1355   Class Stop Time 1430   Class Time Calculation (min) 35 min            Core Videos: Exercise    Move It!  Clinical staff conducted group or individual video education with verbal and written material and guidebook.  Patient learns the recommended Pritikin exercise program. Exercise with the goal of living a long, healthy life. Some of the health benefits of exercise include  controlled diabetes, healthier blood pressure levels, improved cholesterol levels, improved heart and lung capacity, improved sleep, and better body composition. Everyone should speak with their doctor before starting or changing an exercise routine.  Biomechanical Limitations Clinical staff conducted group or individual video education with verbal and written material and guidebook.  Patient learns how biomechanical limitations can impact exercise and how we can mitigate and possibly overcome limitations to have an impactful and balanced exercise routine.  Body Composition Clinical staff conducted group or individual video education with verbal and written material and guidebook.  Patient learns that body composition (ratio of muscle mass to fat mass) is a key component to assessing overall fitness, rather than body weight alone. Increased fat mass, especially visceral belly fat, can put [place] at increased risk for metabolic syndrome, type 2 diabetes, heart disease, and even death. It is recommended to combine diet and exercise (cardiovascular and resistance training) to improve your body composition. Seek guidance from your physician and exercise physiologist before implementing an exercise routine.  Exercise Action Plan Clinical staff conducted group or individual video education with verbal and written material and guidebook.  Patient learns the recommended strategies to achieve and enjoy long-term exercise adherence, including variety, self-motivation, self-efficacy, and positive decision making. Benefits of exercise include fitness, good health, weight management, more energy, better sleep, less stress, and overall well-being.  Medical   Heart Disease Risk Reduction Clinical staff conducted group or individual video education with verbal and written material and guidebook.  Patient learns our heart is our most vital organ as it circulates oxygen, nutrients, white blood cells, and hormones  throughout the entire body, and carries waste away. Data supports a plant-based eating plan like the Pritikin Program for its effectiveness in slowing progression of and reversing heart disease. The video provides a number of recommendations to address heart disease.   Metabolic Syndrome and Belly Fat  Clinical staff conducted group or individual video education with verbal and written material and guidebook.  Patient learns what metabolic syndrome is, how it leads to heart disease, and how one can reverse it and keep it from coming back. You have metabolic syndrome if you have 3 of the following 5 criteria: abdominal obesity, high blood pressure, high triglycerides, low HDL cholesterol, and high blood sugar.  Hypertension and Heart Disease Clinical staff conducted group or individual video education with verbal and written material and guidebook.  Patient learns that high blood pressure, or hypertension, is very common in the [place]. Hypertension is largely due to excessive salt intake, but other important risk factors include being overweight, physical inactivity, drinking too much alcohol, smoking, and not eating enough potassium from fruits and vegetables. High blood pressure is a leading risk factor for heart attack, stroke, congestive heart failure, dementia, kidney failure, and premature death. Long-term effects of excessive salt intake include stiffening of the arteries and thickening of heart muscle and organ damage. Recommendations include ways to reduce hypertension and the risk of heart disease.  Diseases of Our Time - Focusing on Diabetes  Clinical staff conducted group or individual video education with verbal and written material and guidebook.  Patient learns why the best way to stop diseases of our time is prevention, through food and other lifestyle changes. Medicine (such as prescription pills and surgeries) is often only a Band-Aid on the problem, not a long-term solution. Most  common diseases of our time include obesity, type 2 diabetes, hypertension, heart disease, and cancer. The Pritikin Program is recommended and has been proven to help reduce, reverse, and/or prevent the damaging effects of metabolic syndrome.  Nutrition   Overview of the Pritikin Eating Plan  Clinical staff conducted group or individual video education with verbal and written material and guidebook.  Patient learns about the Pritikin Eating Plan for disease risk reduction. The Pritikin Eating Plan emphasizes a wide variety of unrefined, minimally-processed carbohydrates, like fruits, vegetables, whole grains, and legumes. Go, Caution, and Stop food choices are explained. Plant-based and lean animal proteins are emphasized. Rationale provided for low sodium intake for blood pressure control, low added sugars for blood sugar stabilization, and low added fats and oils for coronary artery disease risk reduction and weight management.  Calorie Density  Clinical staff conducted group or individual video education with verbal and written material and guidebook.  Patient learns about calorie density and how it impacts the Pritikin Eating Plan. Knowing the characteristics of the food you choose will help you decide whether those foods will lead to weight gain or weight loss, and whether you want to consume more or less of them. Weight loss is usually a side effect of the Pritikin Eating Plan because of its focus on low calorie-dense foods.  Label Reading  Clinical staff conducted group or individual video education with verbal and written material and guidebook.  Patient learns about the Pritikin recommended label reading guidelines and corresponding recommendations regarding calorie density, added sugars, sodium content, and whole grains.  Dining Out - Part 1  Clinical staff conducted group or individual video education with verbal and written material and guidebook.  Patient learns that restaurant meals  can be sabotaging because they can be so high in calories, fat, sodium, and/or sugar. Patient learns recommended strategies on how to positively address this and avoid unhealthy pitfalls.  Facts on Fats  Clinical staff conducted group or individual video education with verbal and written material and guidebook.  Patient learns that lifestyle modifications can be just as effective, if not more so, as many medications for lowering your risk of heart disease. A Pritikin lifestyle can help to reduce your risk of inflammation and atherosclerosis (cholesterol build-up, or plaque, in the artery walls). Lifestyle interventions such as dietary choices and physical activity address the cause of atherosclerosis. A review of the types of fats and their impact on blood cholesterol levels, along with dietary recommendations to reduce fat intake is also included.  Nutrition Action Plan  Clinical staff conducted group or individual video education with verbal and written material and guidebook.  Patient learns how to incorporate Pritikin recommendations into their lifestyle. Recommendations include planning and keeping personal health goals in mind as an important part of their success.  Healthy Mind-Set    Healthy Minds, Bodies, Hearts  Clinical staff conducted group or individual video education with verbal and written material and guidebook.  Patient learns how to identify when they are stressed. Video will discuss the impact of that stress, as well as the many benefits of stress management. Patient will also be introduced to stress management techniques. The  way we think, act, and feel has an impact on our hearts.  How Our Thoughts Can Heal Our Hearts  Clinical staff conducted group or individual video education with verbal and written material and guidebook.  Patient learns that negative thoughts can cause depression and anxiety. This can result in negative lifestyle behavior and serious health problems.  Cognitive behavioral therapy is an effective method to help control our thoughts in order to change and improve our emotional outlook.  Additional Videos:  Exercise    Improving Performance  Clinical staff conducted group or individual video education with verbal and written material and guidebook.  Patient learns to use a non-linear approach by alternating intensity levels and lengths of time spent exercising to help burn more calories and lose more body fat. Cardiovascular exercise helps improve heart health, metabolism, hormonal balance, blood sugar control, and recovery from fatigue. Resistance training improves strength, endurance, balance, coordination, reaction time, metabolism, and muscle mass. Flexibility exercise improves circulation, posture, and balance. Seek guidance from your physician and exercise physiologist before implementing an exercise routine and learn your capabilities and proper form for all exercise.  Introduction to Yoga  Clinical staff conducted group or individual video education with verbal and written material and guidebook.  Patient learns about yoga, a discipline of the coming together of mind, breath, and body. The benefits of yoga include improved flexibility, improved range of motion, better posture and core strength, increased lung function, weight loss, and positive self-image. Yoga's heart health benefits include lowered blood pressure, healthier heart rate, decreased cholesterol and triglyceride levels, improved immune function, and reduced stress. Seek guidance from your physician and exercise physiologist before implementing an exercise routine and learn your capabilities and proper form for all exercise.  Medical   Aging: Enhancing Your Quality of Life  Clinical staff conducted group or individual video education with verbal and written material and guidebook.  Patient learns key strategies and recommendations to stay in good physical health and enhance  quality of life, such as prevention strategies, having an advocate, securing a Health Care Proxy and Power of Attorney, and keeping a list of medications and system for tracking them. It also discusses how to avoid risk for bone loss.  Biology of Weight Control  Clinical staff conducted group or individual video education with verbal and written material and guidebook.  Patient learns that weight gain occurs because we consume more calories than we burn (eating more, moving less). Even if your body weight is normal, you may have higher ratios of fat compared to muscle mass. Too much body fat puts you at increased risk for cardiovascular disease, heart attack, stroke, type 2 diabetes, and obesity-related cancers. In addition to exercise, following the Pritikin Eating Plan can help reduce your risk.  Decoding Lab Results  Clinical staff conducted group or individual video education with verbal and written material and guidebook.  Patient learns that lab test reflects one measurement whose values change over time and are influenced by many factors, including medication, stress, sleep, exercise, food, hydration, pre-existing medical conditions, and more. It is recommended to use the knowledge from this video to become more involved with your lab results and evaluate your numbers to speak with your doctor.   Diseases of Our Time - Overview  Clinical staff conducted group or individual video education with verbal and written material and guidebook.  Patient learns that according to the CDC, 50% to 70% of chronic diseases (such as obesity, type 2 diabetes, elevated lipids, hypertension, and  heart disease) are avoidable through lifestyle improvements including healthier food choices, listening to satiety cues, and increased physical activity.  Sleep Disorders Clinical staff conducted group or individual video education with verbal and written material and guidebook.  Patient learns how good quality and  duration of sleep are important to overall health and well-being. Patient also learns about sleep disorders and how they impact health along with recommendations to address them, including discussing with a physician.  Nutrition  Dining Out - Part 2 Clinical staff conducted group or individual video education with verbal and written material and guidebook.  Patient learns how to plan ahead and communicate in order to maximize their dining experience in a healthy and nutritious manner. Included are recommended food choices based on the type of restaurant the patient is visiting.   Fueling a Banker conducted group or individual video education with verbal and written material and guidebook.  There is a strong connection between our food choices and our health. Diseases like obesity and type 2 diabetes are very prevalent and are in large-part due to lifestyle choices. The Pritikin Eating Plan provides plenty of food and hunger-curbing satisfaction. It is easy to follow, affordable, and helps reduce health risks.  Menu Workshop  Clinical staff conducted group or individual video education with verbal and written material and guidebook.  Patient learns that restaurant meals can sabotage health goals because they are often packed with calories, fat, sodium, and sugar. Recommendations include strategies to plan ahead and to communicate with the manager, chef, or server to help order a healthier meal.  Planning Your Eating Strategy  Clinical staff conducted group or individual video education with verbal and written material and guidebook.  Patient learns about the Pritikin Eating Plan and its benefit of reducing the risk of disease. The Pritikin Eating Plan does not focus on calories. Instead, it emphasizes high-quality, nutrient-rich foods. By knowing the characteristics of the foods, we choose, we can determine their calorie density and make informed decisions.  Targeting Your  Nutrition Priorities  Clinical staff conducted group or individual video education with verbal and written material and guidebook.  Patient learns that lifestyle habits have a tremendous impact on disease risk and progression. This video provides eating and physical activity recommendations based on your personal health goals, such as reducing LDL cholesterol, losing weight, preventing or controlling type 2 diabetes, and reducing high blood pressure.  Vitamins and Minerals  Clinical staff conducted group or individual video education with verbal and written material and guidebook.  Patient learns different ways to obtain key vitamins and minerals, including through a recommended healthy diet. It is important to discuss all supplements you take with your doctor.   Healthy Mind-Set    Smoking Cessation  Clinical staff conducted group or individual video education with verbal and written material and guidebook.  Patient learns that cigarette smoking and tobacco addiction pose a serious health risk which affects millions of people. Stopping smoking will significantly reduce the risk of heart disease, lung disease, and many forms of cancer. Recommended strategies for quitting are covered, including working with your doctor to develop a successful plan.  Culinary   Becoming a Set designer conducted group or individual video education with verbal and written material and guidebook.  Patient learns that cooking at home can be healthy, cost-effective, quick, and puts them in control. Keys to cooking healthy recipes will include looking at your recipe, assessing your equipment needs, planning ahead, making it  simple, choosing cost-effective seasonal ingredients, and limiting the use of added fats, salts, and sugars.  Cooking - Breakfast and Snacks  Clinical staff conducted group or individual video education with verbal and written material and guidebook.  Patient learns how important  breakfast is to satiety and nutrition through the entire day. Recommendations include key foods to eat during breakfast to help stabilize blood sugar levels and to prevent overeating at meals later in the day. Planning ahead is also a key component.  Cooking - Educational psychologist conducted group or individual video education with verbal and written material and guidebook.  Patient learns eating strategies to improve overall health, including an approach to cook more at home. Recommendations include thinking of animal protein as a side on your plate rather than center stage and focusing instead on lower calorie dense options like vegetables, fruits, whole grains, and plant-based proteins, such as beans. Making sauces in large quantities to freeze for later and leaving the skin on your vegetables are also recommended to maximize your experience.  Cooking - Healthy Salads and Dressing Clinical staff conducted group or individual video education with verbal and written material and guidebook.  Patient learns that vegetables, fruits, whole grains, and legumes are the foundations of the Pritikin Eating Plan. Recommendations include how to incorporate each of these in flavorful and healthy salads, and how to create homemade salad dressings. Proper handling of ingredients is also covered. Cooking - Soups and [employer] - Soups and Desserts Clinical staff conducted group or individual video education with verbal and written material and guidebook.  Patient learns that Pritikin soups and desserts make for easy, nutritious, and delicious snacks and meal components that are low in sodium, fat, sugar, and calorie density, while high in vitamins, minerals, and filling fiber. Recommendations include simple and healthy ideas for soups and desserts.   Overview     The Pritikin Solution Program Overview Clinical staff conducted group or individual video education with verbal and written material  and guidebook.  Patient learns that the results of the Pritikin Program have been documented in more than 100 articles published in peer-reviewed journals, and the benefits include reducing risk factors for (and, in some cases, even reversing) high cholesterol, high blood pressure, type 2 diabetes, obesity, and more! An overview of the three key pillars of the Pritikin Program will be covered: eating well, doing regular exercise, and having a healthy mind-set.  WORKSHOPS  Exercise: Exercise Basics: Building Your Action Plan Clinical staff led group instruction and group discussion with PowerPoint presentation and patient guidebook. To enhance the learning environment the use of posters, models and videos may be added. At the conclusion of this workshop, patients will comprehend the difference between physical activity and exercise, as well as the benefits of incorporating both, into their routine. Patients will understand the FITT (Frequency, Intensity, Time, and Type) principle and how to use it to build an exercise action plan. In addition, safety concerns and other considerations for exercise and cardiac rehab will be addressed by the presenter. The purpose of this lesson is to promote a comprehensive and effective weekly exercise routine in order to improve patients' overall level of fitness.   Managing Heart Disease: Your Path to a Healthier Heart Clinical staff led group instruction and group discussion with PowerPoint presentation and patient guidebook. To enhance the learning environment the use of posters, models and videos may be added.At the conclusion of this workshop, patients will understand the anatomy and  physiology of the heart. Additionally, they will understand how Pritikin's three pillars impact the risk factors, the progression, and the management of heart disease.  The purpose of this lesson is to provide a high-level overview of the heart, heart disease, and how the Pritikin  lifestyle positively impacts risk factors.  Exercise Biomechanics Clinical staff led group instruction and group discussion with PowerPoint presentation and patient guidebook. To enhance the learning environment the use of posters, models and videos may be added. Patients will learn how the structural parts of their bodies function and how these functions impact their daily activities, movement, and exercise. Patients will learn how to promote a neutral spine, learn how to manage pain, and identify ways to improve their physical movement in order to promote healthy living. The purpose of this lesson is to expose patients to common physical limitations that impact physical activity. Participants will learn practical ways to adapt and manage aches and pains, and to minimize their effect on regular exercise. Patients will learn how to maintain good posture while sitting, walking, and lifting.  Balance Training and Fall Prevention  Clinical staff led group instruction and group discussion with PowerPoint presentation and patient guidebook. To enhance the learning environment the use of posters, models and videos may be added. At the conclusion of this workshop, patients will understand the importance of their sensorimotor skills (vision, proprioception, and the vestibular system) in maintaining their ability to balance as they age. Patients will apply a variety of balancing exercises that are appropriate for their current level of function. Patients will understand the common causes for poor balance, possible solutions to these problems, and ways to modify their physical environment in order to minimize their fall risk. The purpose of this lesson is to teach patients about the importance of maintaining balance as they age and ways to minimize their risk of falling.  WORKSHOPS   Nutrition:  Fueling a Ship broker led group instruction and group discussion with PowerPoint presentation  and patient guidebook. To enhance the learning environment the use of posters, models and videos may be added. Patients will review the foundational principles of the Pritikin Eating Plan and understand what constitutes a serving size in each of the food groups. Patients will also learn Pritikin-friendly foods that are better choices when away from home and review make-ahead meal and snack options. Calorie density will be reviewed and applied to three nutrition priorities: weight maintenance, weight loss, and weight gain. The purpose of this lesson is to reinforce (in a group setting) the key concepts around what patients are recommended to eat and how to apply these guidelines when away from home by planning and selecting Pritikin-friendly options. Patients will understand how calorie density may be adjusted for different weight management goals.  Mindful Eating  Clinical staff led group instruction and group discussion with PowerPoint presentation and patient guidebook. To enhance the learning environment the use of posters, models and videos may be added. Patients will briefly review the concepts of the Pritikin Eating Plan and the importance of low-calorie dense foods. The concept of mindful eating will be introduced as well as the importance of paying attention to internal hunger signals. Triggers for non-hunger eating and techniques for dealing with triggers will be explored. The purpose of this lesson is to provide patients with the opportunity to review the basic principles of the Pritikin Eating Plan, discuss the value of eating mindfully and how to measure internal cues of hunger and fullness using the Hunger  Scale. Patients will also discuss reasons for non-hunger eating and learn strategies to use for controlling emotional eating.  Targeting Your Nutrition Priorities Clinical staff led group instruction and group discussion with PowerPoint presentation and patient guidebook. To enhance the  learning environment the use of posters, models and videos may be added. Patients will learn how to determine their genetic susceptibility to disease by reviewing their family history. Patients will gain insight into the importance of diet as part of an overall healthy lifestyle in mitigating the impact of genetics and other environmental insults. The purpose of this lesson is to provide patients with the opportunity to assess their personal nutrition priorities by looking at their family history, their own health history and current risk factors. Patients will also be able to discuss ways of prioritizing and modifying the Pritikin Eating Plan for their highest risk areas  Menu  Clinical staff led group instruction and group discussion with PowerPoint presentation and patient guidebook. To enhance the learning environment the use of posters, models and videos may be added. Using menus brought in from [employer], or printed from [employer], patients will apply the Pritikin dining out guidelines that were presented in the [employer] video. Patients will also be able to practice these guidelines in a variety of provided scenarios. The purpose of this lesson is to provide patients with the opportunity to practice hands-on learning of the Pritikin Dining Out guidelines with actual menus and practice scenarios.  Label Reading Clinical staff led group instruction and group discussion with PowerPoint presentation and patient guidebook. To enhance the learning environment the use of posters, models and videos may be added. Patients will review and discuss the Pritikin label reading guidelines presented in Pritikin's Label Reading Educational series video. Using fool labels brought in from local grocery stores and markets, patients will apply the label reading guidelines and determine if the packaged food meet the Pritikin guidelines. The purpose of this lesson is to provide patients with  the opportunity to review, discuss, and practice hands-on learning of the Pritikin Label Reading guidelines with actual packaged food labels. Cooking School  Pritikin's [employer] are designed to teach patients ways to prepare quick, simple, and affordable recipes at home. The importance of nutrition's role in chronic disease risk reduction is reflected in its emphasis in the overall Pritikin program. By learning how to prepare essential core Pritikin Eating Plan recipes, patients will increase control over what they eat; be able to customize the flavor of foods without the use of added salt, sugar, or fat; and improve the quality of the food they consume. By learning a set of core recipes which are easily assembled, quickly prepared, and affordable, patients are more likely to prepare more healthy foods at home. These workshops focus on convenient breakfasts, simple entres, side dishes, and desserts which can be prepared with minimal effort and are consistent with nutrition recommendations for cardiovascular risk reduction. Cooking [employer] are taught by a Armed forces logistics/support/administrative officer (RD) who has been trained by the [employer]. The chef or RD has a clear understanding of the importance of minimizing - if not completely eliminating - added fat, sugar, and sodium in recipes. Throughout the series of Cooking School Workshop sessions, patients will learn about healthy ingredients and efficient methods of cooking to build confidence in their capability to prepare    Cooking School weekly topics:  Adding Flavor- Sodium-Free  Fast and Healthy Breakfasts  Powerhouse Plant-Based Proteins  Satisfying Salads and Dressings  Simple Sides and Sauces  International Cuisine-Spotlight on the [employer] Zones  Delicious Desserts  Savory Soups  [employer] - Meals in a Snap  Tasty Appetizers and Snacks  Comforting Weekend Breakfasts  One-Pot Wonders   Fast Evening  Meals  Landscape architect Your Pritikin Plate  WORKSHOPS   Healthy Mindset (Psychosocial):  Focused Goals, Sustainable Changes Clinical staff led group instruction and group discussion with PowerPoint presentation and patient guidebook. To enhance the learning environment the use of posters, models and videos may be added. Patients will be able to apply effective goal setting strategies to establish at least one personal goal, and then take consistent, meaningful action toward that goal. They will learn to identify common barriers to achieving personal goals and develop strategies to overcome them. Patients will also gain an understanding of how our mind-set can impact our ability to achieve goals and the importance of cultivating a positive and growth-oriented mind-set. The purpose of this lesson is to provide patients with a deeper understanding of how to set and achieve personal goals, as well as the tools and strategies needed to overcome common obstacles which may arise along the way.  From Head to Heart: The Power of a Healthy Outlook  Clinical staff led group instruction and group discussion with PowerPoint presentation and patient guidebook. To enhance the learning environment the use of posters, models and videos may be added. Patients will be able to recognize and describe the impact of emotions and mood on physical health. They will discover the importance of self-care and explore self-care practices which may work for them. Patients will also learn how to utilize the 4 C's to cultivate a healthier outlook and better manage stress and challenges. The purpose of this lesson is to demonstrate to patients how a healthy outlook is an essential part of maintaining good health, especially as they continue their cardiac rehab journey.  Healthy Sleep for a Healthy Heart Clinical staff led group instruction and group discussion with PowerPoint presentation and patient guidebook. To  enhance the learning environment the use of posters, models and videos may be added. At the conclusion of this workshop, patients will be able to demonstrate knowledge of the importance of sleep to overall health, well-being, and quality of life. They will understand the symptoms of, and treatments for, common sleep disorders. Patients will also be able to identify daytime and nighttime behaviors which impact sleep, and they will be able to apply these tools to help manage sleep-related challenges. The purpose of this lesson is to provide patients with a general overview of sleep and outline the importance of quality sleep. Patients will learn about a few of the most common sleep disorders. Patients will also be introduced to the concept of "sleep hygiene," and discover ways to self-manage certain sleeping problems through simple daily behavior changes. Finally, the workshop will motivate patients by clarifying the links between quality sleep and their goals of heart-healthy living.   Recognizing and Reducing Stress Clinical staff led group instruction and group discussion with PowerPoint presentation and patient guidebook. To enhance the learning environment the use of posters, models and videos may be added. At the conclusion of this workshop, patients will be able to understand the types of stress reactions, differentiate between acute and chronic stress, and recognize the impact that chronic stress has on their health. They will also be able to apply different coping mechanisms, such as reframing negative self-talk. Patients will have the opportunity  to practice a variety of stress management techniques, such as deep abdominal breathing, progressive muscle relaxation, and/or guided imagery.  The purpose of this lesson is to educate patients on the role of stress in their lives and to provide healthy techniques for coping with it.  Learning Barriers/Preferences:  Learning Barriers/Preferences - 08/10/22  1027       Learning Barriers/Preferences   Learning Barriers Sight   wears glasses   Learning Preferences Audio;Computer/Internet;Group Instruction;Individual Instruction;Skilled Demonstration;Verbal Instruction;Video;Written Material;Pictoral             Education Topics:  Knowledge Questionnaire Score:  Knowledge Questionnaire Score - 08/10/22 1201       Knowledge Questionnaire Score   Pre Score 18/24             Core Components/Risk Factors/Patient Goals at Admission:  Personal Goals and Risk Factors at Admission - 08/10/22 1028       Core Components/Risk Factors/Patient Goals on Admission   Hypertension Yes    Intervention Provide education on lifestyle modifcations including regular physical activity/exercise, weight management, moderate sodium restriction and increased consumption of fresh fruit, vegetables, and low fat dairy, alcohol moderation, and smoking cessation.;Monitor prescription use compliance.    Expected Outcomes Long Term: Maintenance of blood pressure at goal levels.;Short Term: Continued assessment and intervention until BP is < 140/30mm HG in hypertensive participants. < 130/61mm HG in hypertensive participants with diabetes, heart failure or chronic kidney disease.    Lipids Yes    Intervention Provide education and support for participant on nutrition & aerobic/resistive exercise along with prescribed medications to achieve LDL 70mg , HDL >40mg .    Expected Outcomes Short Term: Participant states understanding of desired cholesterol values and is compliant with medications prescribed. Participant is following exercise prescription and nutrition guidelines.;Long Term: Cholesterol controlled with medications as prescribed, with individualized exercise RX and with personalized nutrition plan. Value goals: LDL < 70mg , HDL > 40 mg.             Core Components/Risk Factors/Patient Goals Review:   Goals and Risk Factor Review     Row Name 08/20/22  0818 09/02/22 1742           Core Components/Risk Factors/Patient Goals Review   Personal Goals Review Weight Management/Obesity;Hypertension;Lipids Weight Management/Obesity;Hypertension;Lipids      Review Kowen started intensive cardiac rehab on 08/19/22. Avram did well with exercise, vital signs were stable Matthew is doing  well with exercise at intensive cardiac rehab, vital signs have been  stable      Expected Outcomes Khadir will continue to participate in intensive cardiac rehab for exercise, nutrition and lifestyle modifications Drake will continue to participate in intensive cardiac rehab for exercise, nutrition and lifestyle modifications               Core Components/Risk Factors/Patient Goals at Discharge (Final Review):   Goals and Risk Factor Review - 09/02/22 1742       Core Components/Risk Factors/Patient Goals Review   Personal Goals Review Weight Management/Obesity;Hypertension;Lipids    Review Reinaldo is doing  well with exercise at intensive cardiac rehab, vital signs have been  stable    Expected Outcomes Aaryan will continue to participate in intensive cardiac rehab for exercise, nutrition and lifestyle modifications             ITP Comments:  ITP Comments     Row Name 08/10/22 1024 08/20/22 0816 09/02/22 1740       ITP Comments Dr. Armanda Magic medical director. Introduction to [facility]  education/ intensive cardiac rehab. Initial orientation packet reviewed with patient. 30 Day ITP Review. Eulas started intensive cardiac rehab on 08/19/22 and did well with exercise 30 Day ITP Review. Or has good attendance and participation in intensive cardiac rehab              Comments: See ITP comments.Thayer Headings RN BSN

## 2022-09-04 ENCOUNTER — Encounter: Payer: Self-pay | Admitting: Cardiovascular Disease

## 2022-09-04 ENCOUNTER — Ambulatory Visit: Payer: Medicare Other | Attending: Cardiology | Admitting: Cardiovascular Disease

## 2022-09-04 ENCOUNTER — Encounter (HOSPITAL_COMMUNITY)
Admission: RE | Admit: 2022-09-04 | Discharge: 2022-09-04 | Disposition: A | Discharge status: Home / Self Care | Payer: Medicare Other | Source: Ambulatory Visit | Attending: Cardiology | Admitting: Cardiology

## 2022-09-04 VITALS — BP 128/90 | HR 69 | Ht 68.0 in | Wt 175.0 lb

## 2022-09-04 DIAGNOSIS — I10 Essential (primary) hypertension: Secondary | ICD-10-CM | POA: Diagnosis not present

## 2022-09-04 DIAGNOSIS — I214 Non-ST elevation (NSTEMI) myocardial infarction: Secondary | ICD-10-CM

## 2022-09-04 DIAGNOSIS — E782 Mixed hyperlipidemia: Secondary | ICD-10-CM

## 2022-09-04 LAB — HEPATIC FUNCTION PANEL
ALT: 22 IU/L (ref 0–44)
AST: 23 IU/L (ref 0–40)
Albumin: 4.5 g/dL (ref 3.8–4.8)
Alkaline Phosphatase: 86 IU/L (ref 44–121)
Bilirubin Total: 0.5 mg/dL (ref 0.0–1.2)
Bilirubin, Direct: 0.13 mg/dL (ref 0.00–0.40)
Total Protein: 6.8 g/dL (ref 6.0–8.5)

## 2022-09-04 LAB — LIPID PANEL
Chol/HDL Ratio: 3.6 ratio (ref 0.0–5.0)
Cholesterol, Total: 139 mg/dL (ref 100–199)
HDL: 39 mg/dL — ABNORMAL LOW (ref >39)
LDL Chol Calc (NIH): 81 mg/dL (ref 0–99)
Triglycerides: 99 mg/dL (ref 0–149)
VLDL Cholesterol Cal: 19 mg/dL (ref 5–40)

## 2022-09-04 NOTE — Patient Instructions (Signed)
Medication Instructions:  Your physician recommends that you continue on your current medications as directed. Please refer to the Current Medication list given to you today.  *If you need a refill on your cardiac medications before your next appointment, please call your pharmacy*   Lab Work: Your physician recommends that you have labs drawn today: Lipid/liver panel  If you have labs (blood work) drawn today and your tests are completely normal, you will receive your results only by: MyChart Message (if you have MyChart) OR A paper copy in the mail If you have any lab test that is abnormal or we need to change your treatment, we will call you to review the results.   Follow-Up: At [facility], you and your health needs are our priority.  As part of our continuing mission to provide you with exceptional heart care, we have created designated Provider Care Teams.  These Care Teams include your primary Cardiologist (physician) and Advanced Practice Providers (APPs -  Physician Assistants and Nurse Practitioners) who all work together to provide you with the care you need, when you need it.  We recommend signing up for the patient portal called "MyChart".  Sign up information is provided on this After Visit Summary.  MyChart is used to connect with patients for Virtual Visits (Telemedicine).  Patients are able to view lab/test results, encounter notes, upcoming appointments, etc.  Non-urgent messages can be sent to your provider as well.   To learn more about what you can do with MyChart, go to [email or web address].    Your next appointment:   6 month(s)  Provider:   Juanda Crumble, PA-C       Then, Nanetta Batty, MD will plan to see you again in 12 month(s).

## 2022-09-04 NOTE — Assessment & Plan Note (Signed)
History of essential hypertension blood pressure measured today at 128/90.  He is on metoprolol and Micardis.  I reviewed his home blood pressure log which shows a blood pressure typically in the 110s to 120/60-70 range.

## 2022-09-04 NOTE — Progress Notes (Signed)
09/04/2022 [name]   1950-06-11  [id number]  Primary Physician Saguier, Kateri Mc Primary Cardiologist: Runell Gess MD Nicholes Calamity, [place]  HPI:  Bryan Craig is a 72 y.o. thin-appearing married Latino male originally from [place] who has been in the [place] since [place] when he came over at age 33.  He is a father of 3 daughters but has no grandchildren yet.  He is retired from being in the Equities trader.  He was originally seen by myself when he presented with chest pain and ultimately had a non-STEMI 07/22/2022.  The following day he underwent PCI drug-eluting stenting of his ramus branch by Dr. Clifton James.  His risk factors include family history with father and brother both who who had ischemic heart disease, treated hypertension and hyperlipidemia.  He has had no recurrent symptoms.  He remains on DAPT with aspirin and Brilinta.  He is participating cardiac rehab.  Current Meds  Medication Sig   aspirin EC 81 MG tablet Take 1 tablet (81 mg total) by mouth daily. Swallow whole.   atorvastatin (LIPITOR) 80 MG tablet Take 1 tablet (80 mg total) by mouth at bedtime.   Difluprednate 0.05 % EMUL Administer 1 drop into the left eye daily.   metoprolol succinate (TOPROL-XL) 50 MG 24 hr tablet Take 1 tablet (50 mg total) by mouth daily.   Risankizumab-rzaa (SKYRIZI) 150 MG/ML SOSY Inject into the skin once a week.   telmisartan (MICARDIS) 80 MG tablet Take 1 tablet (80 mg total) by mouth daily.   ticagrelor (BRILINTA) 90 MG TABS tablet Take 1 tablet (90 mg total) by mouth 2 (two) times daily.   triamcinolone cream (KENALOG) 0.1 % Apply 1 Application topically 2 (two) times daily. (Patient taking differently: Apply 1 Application topically 2 (two) times daily as needed (irritation).)     Allergies  Allergen Reactions   Lisinopril     REACTION: cough   Penicillins     Social History   Socioeconomic History   Marital status: Married    Spouse name: Not on  file   Number of children: Not on file   Years of education: Not on file   Highest education level: Not on file  Occupational History   Not on file  Tobacco Use   Smoking status: Never   Smokeless tobacco: Never  Substance and Sexual Activity   Alcohol use: Yes    Comment: rare occasional social drinker.   Drug use: Never   Sexual activity: Yes  Other Topics Concern   Not on file  Social History Narrative   Not on file   Social Determinants of Health   Financial Resource Strain: Not on file  Food Insecurity: Patient Declined (07/21/2022)   Hunger Vital Sign    Worried About Running Out of Food in the Last Year: Patient declined    Ran Out of Food in the Last Year: Patient declined  Transportation Needs: Patient Declined (07/21/2022)   PRAPARE - Administrator, Civil Service (Medical): Patient declined    Lack of Transportation (Non-Medical): Patient declined  Physical Activity: Not on file  Stress: Not on file  Social Connections: Not on file  Intimate Partner Violence: Patient Declined (07/21/2022)   Humiliation, Afraid, Rape, and Kick questionnaire    Fear of Current or Ex-Partner: Patient declined    Emotionally Abused: Patient declined    Physically Abused: Patient declined    Sexually Abused: Patient declined     Review of  Systems: General: negative for chills, fever, night sweats or weight changes.  Cardiovascular: negative for chest pain, dyspnea on exertion, edema, orthopnea, palpitations, paroxysmal nocturnal dyspnea or shortness of breath Dermatological: negative for rash Respiratory: negative for cough or wheezing Urologic: negative for hematuria Abdominal: negative for nausea, vomiting, diarrhea, bright red blood per rectum, melena, or hematemesis Neurologic: negative for visual changes, syncope, or dizziness All other systems reviewed and are otherwise negative except as noted above.    Blood pressure (!) 128/90, pulse 69, height 5\' 8"  (1.727  m), weight 175 lb (79.4 kg), SpO2 98 %.  General appearance: alert and no distress Neck: no adenopathy, no carotid bruit, no JVD, supple, symmetrical, trachea midline, and thyroid not enlarged, symmetric, no tenderness/mass/nodules Lungs: clear to auscultation bilaterally Heart: regular rate and rhythm, S1, S2 normal, no murmur, click, rub or gallop Extremities: extremities normal, atraumatic, no cyanosis or edema Pulses: 2+ and symmetric Skin: Skin color, texture, turgor normal. No rashes or lesions Neurologic: Grossly normal  EKG not performed today  ASSESSMENT AND PLAN:   Hyperlipidemia History of hyperlipidemia on high-dose statin therapy with lipid profile performed 07/21/2022 revealing total cholesterol of 225, LDL of 138 and HDL of 41.  At that time his atorvastatin was 20 mg.  I am going to recheck a lipid liver profile since his dose was uptitrated.  Hypertension History of essential hypertension blood pressure measured today at 128/90.  He is on metoprolol and Micardis.  I reviewed his home blood pressure log which shows a blood pressure typically in the 110s to 120/60-70 range.  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 Clifton James.  2D echo showed normal LV function.  Patient is on aspirin and Brilinta and participating in cardiac rehab.  He said no recurrent symptoms.     Runell Gess MD FACP,FACC,FAHA, [facility] 09/04/2022 8:26 AM

## 2022-09-04 NOTE — Assessment & Plan Note (Signed)
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 Clifton James.  2D echo showed normal LV function.  Patient is on aspirin and Brilinta and participating in cardiac rehab.  He said no recurrent symptoms.

## 2022-09-04 NOTE — Assessment & Plan Note (Signed)
History of hyperlipidemia on high-dose statin therapy with lipid profile performed 07/21/2022 revealing total cholesterol of 225, LDL of 138 and HDL of 41.  At that time his atorvastatin was 20 mg.  I am going to recheck a lipid liver profile since his dose was uptitrated.

## 2022-09-07 ENCOUNTER — Encounter (HOSPITAL_COMMUNITY)
Admission: RE | Admit: 2022-09-07 | Discharge: 2022-09-07 | Disposition: A | Discharge status: Home / Self Care | Payer: Medicare Other | Source: Ambulatory Visit | Attending: Cardiology | Admitting: Cardiology

## 2022-09-07 DIAGNOSIS — I214 Non-ST elevation (NSTEMI) myocardial infarction: Secondary | ICD-10-CM | POA: Diagnosis not present

## 2022-09-09 ENCOUNTER — Encounter (HOSPITAL_COMMUNITY)
Admission: RE | Admit: 2022-09-09 | Discharge: 2022-09-09 | Disposition: A | Discharge status: Home / Self Care | Payer: Medicare Other | Source: Ambulatory Visit | Attending: Cardiology | Admitting: Cardiology

## 2022-09-09 ENCOUNTER — Ambulatory Visit: Payer: Medicare Other | Admitting: Cardiology

## 2022-09-09 DIAGNOSIS — I214 Non-ST elevation (NSTEMI) myocardial infarction: Secondary | ICD-10-CM | POA: Diagnosis not present

## 2022-09-09 NOTE — Progress Notes (Signed)
CARDIAC REHAB PHASE 2  Reviewed home exercise with pt today. Pt is tolerating exercise well. Pt will continue to exercise on his own by biking for 30-45 minutes per session 1-2 days a week in addition to the 3 days in CRP2. Advised pt on THRR, RPE scale, hydration and temperature/humidity precautions. Reinforced S/S to stop exercise and when to call MD vs 911. Encouraged warm up cool down and stretches with exercise sessions. Pt verbalized understanding, all questions were answered and pt was given a copy to take home.    Harrie Jeans ACSM-CEP 09/09/2022 4:48 PM

## 2022-09-11 ENCOUNTER — Encounter (HOSPITAL_COMMUNITY)
Admission: RE | Admit: 2022-09-11 | Discharge: 2022-09-11 | Disposition: A | Discharge status: Home / Self Care | Payer: Medicare Other | Source: Ambulatory Visit | Attending: Cardiology | Admitting: Cardiology

## 2022-09-11 DIAGNOSIS — I214 Non-ST elevation (NSTEMI) myocardial infarction: Secondary | ICD-10-CM | POA: Diagnosis not present

## 2022-09-14 ENCOUNTER — Encounter (HOSPITAL_COMMUNITY)
Admission: RE | Admit: 2022-09-14 | Discharge: 2022-09-14 | Disposition: A | Discharge status: Home / Self Care | Payer: Medicare Other | Source: Ambulatory Visit | Attending: Cardiology | Admitting: Cardiology

## 2022-09-14 DIAGNOSIS — I214 Non-ST elevation (NSTEMI) myocardial infarction: Secondary | ICD-10-CM

## 2022-09-14 NOTE — Progress Notes (Signed)
Daily Session Note  Patient Details  Name: Bryan Craig MRN: [id number] Date of Birth: 04/30/1950 Referring Provider:   Flowsheet Row INTENSIVE CARDIAC REHAB ORIENT from 08/10/2022 in [facility]  Referring Provider Gypsy Balsam, MD       Encounter Date: 09/14/2022  Check In:  Session Check In - 09/14/22 1433       Check-In   Supervising physician immediately available to respond to emergencies [facility] MD immediately available    Physician(s) Bernadene Person, NP    Location [facility]    Staff Present Valinda Party, MS, Exercise Physiologist;Jetta Dan Humphreys BS, ACSM-CEP, Exercise Physiologist;David Manus Gunning, MS, ACSM-CEP, CCRP, Exercise Physiologist;Izsak Meir Cleophas Dunker, RN, MSN;Mary Gerre Scull, RN, Zachery Conch, MS, ACSM-CEP, Exercise Physiologist    Virtual Visit No    Medication changes reported     No    Fall or balance concerns reported    No    Tobacco Cessation No Change    Warm-up and Cool-down Performed as group-led instruction    Resistance Training Performed Yes    VAD Patient? No    PAD/SET Patient? No      Pain Assessment   Currently in Pain? No/denies    Pain Score 0-No pain    Multiple Pain Sites No             Capillary Blood Glucose: No results found for this or any previous visit (from the past 24 hour(s)).    Social History   Tobacco Use  Smoking Status Never  Smokeless Tobacco Never    Goals Met:  Independence with exercise equipment Exercise tolerated well No report of concerns or symptoms today Strength training completed today  Goals Unmet:  Not Applicable  Comments: patient had four episodes of couplets. Pre-exercise 128/78; during exercise 158/70. Asymptomatic. Rhythm stirps showed to E. Monge, NP. No new orders.    Dr. Armanda Magic is Medical Director for Cardiac Rehab at [facility].

## 2022-09-16 ENCOUNTER — Encounter (HOSPITAL_COMMUNITY)
Admission: RE | Admit: 2022-09-16 | Discharge: 2022-09-16 | Disposition: A | Discharge status: Home / Self Care | Payer: Medicare Other | Source: Ambulatory Visit | Attending: Cardiology | Admitting: Cardiology

## 2022-09-16 DIAGNOSIS — I214 Non-ST elevation (NSTEMI) myocardial infarction: Secondary | ICD-10-CM

## 2022-09-18 ENCOUNTER — Encounter (HOSPITAL_COMMUNITY)
Admission: RE | Admit: 2022-09-18 | Discharge: 2022-09-18 | Disposition: A | Discharge status: Home / Self Care | Payer: Medicare Other | Source: Ambulatory Visit | Attending: Cardiology | Admitting: Cardiology

## 2022-09-18 DIAGNOSIS — I214 Non-ST elevation (NSTEMI) myocardial infarction: Secondary | ICD-10-CM | POA: Diagnosis not present

## 2022-09-23 ENCOUNTER — Encounter (HOSPITAL_COMMUNITY)
Admission: RE | Admit: 2022-09-23 | Discharge: 2022-09-23 | Disposition: A | Discharge status: Home / Self Care | Payer: Medicare Other | Source: Ambulatory Visit | Attending: Cardiology | Admitting: Cardiology

## 2022-09-23 DIAGNOSIS — I214 Non-ST elevation (NSTEMI) myocardial infarction: Secondary | ICD-10-CM | POA: Diagnosis not present

## 2022-09-24 ENCOUNTER — Telehealth: Payer: Self-pay | Admitting: Cardiovascular Disease

## 2022-09-24 NOTE — Telephone Encounter (Signed)
Pt c/o medication issue:  1. Name of Medication: Skyrizi  2. How are you currently taking this medication (dosage and times per day)?  Injection  every 3 months  3. Are you having a reaction (difficulty breathing--STAT)?   4. What is your medication issue? Patient wants to know if this medicine is alright for him to take?

## 2022-09-24 NOTE — Telephone Encounter (Signed)
*  STAT* If patient is at the pharmacy, call can be transferred to refill team.   1. Which medications need to be refilled? (please list name of each medication and dose if known) new prescription for Zetia 10 mg  2. Which pharmacy/location (including street and city if local pharmacy) is medication to be sent to? [facility] [address], [place]  3. Do they need a 30 day or 90 day supply? 90 days and refills

## 2022-09-25 ENCOUNTER — Encounter (HOSPITAL_COMMUNITY)
Admission: RE | Admit: 2022-09-25 | Discharge: 2022-09-25 | Disposition: A | Discharge status: Home / Self Care | Payer: Medicare Other | Source: Ambulatory Visit | Attending: Cardiology | Admitting: Cardiology

## 2022-09-25 DIAGNOSIS — I214 Non-ST elevation (NSTEMI) myocardial infarction: Secondary | ICD-10-CM | POA: Diagnosis not present

## 2022-09-25 MED ORDER — EZETIMIBE 10 MG PO TABS: 10.0000 mg | ORAL_TABLET | Freq: Every day | ORAL | 3 refills | Status: DC

## 2022-09-25 NOTE — Telephone Encounter (Signed)
Spoke to wife . She is aware of  information from [facility] pharmacist She verbalized understanding.

## 2022-09-25 NOTE — Telephone Encounter (Signed)
Spoke to wife she is aware medication has been sent to pharmacy

## 2022-09-25 NOTE — Telephone Encounter (Signed)
No drug drug interactions noted. Patient should be cautioned not to receive any live vaccines while on [employer]

## 2022-09-28 ENCOUNTER — Encounter (HOSPITAL_COMMUNITY)
Admission: RE | Admit: 2022-09-28 | Discharge: 2022-09-28 | Disposition: A | Discharge status: Home / Self Care | Payer: Medicare Other | Source: Ambulatory Visit | Attending: Cardiology | Admitting: Cardiology

## 2022-09-28 ENCOUNTER — Other Ambulatory Visit: Payer: Self-pay

## 2022-09-28 DIAGNOSIS — E782 Mixed hyperlipidemia: Secondary | ICD-10-CM

## 2022-09-28 DIAGNOSIS — I214 Non-ST elevation (NSTEMI) myocardial infarction: Secondary | ICD-10-CM | POA: Insufficient documentation

## 2022-09-28 DIAGNOSIS — E785 Hyperlipidemia, unspecified: Secondary | ICD-10-CM

## 2022-09-30 ENCOUNTER — Encounter (HOSPITAL_COMMUNITY)
Admission: RE | Admit: 2022-09-30 | Discharge: 2022-09-30 | Disposition: A | Discharge status: Home / Self Care | Payer: Medicare Other | Source: Ambulatory Visit | Attending: Cardiology | Admitting: Cardiology

## 2022-09-30 DIAGNOSIS — I214 Non-ST elevation (NSTEMI) myocardial infarction: Secondary | ICD-10-CM

## 2022-09-30 NOTE — Progress Notes (Signed)
Cardiac Individual Treatment Plan  Patient Details  Name: Bryan Craig MRN: [id number] Date of Birth: 06-08-1950 Referring Provider:   Flowsheet Row INTENSIVE CARDIAC REHAB ORIENT from 08/10/2022 in [facility], V[facility], [facility]  Referring Provider Gypsy Balsam, MD       Initial Encounter Date:  Flowsheet Row INTENSIVE CARDIAC REHAB ORIENT from 08/10/2022 in [facility] for [facility], Vascular, [facility]  Date 08/10/22       Visit Diagnosis: NSTEMI (non-ST elevated myocardial infarction) [facility])  Patient's Home Medications on Admission:  Current Outpatient Medications:    aspirin EC 81 MG tablet, Take 1 tablet (81 mg total) by mouth daily. Swallow whole., Disp: 90 tablet, Rfl: 3   atorvastatin (LIPITOR) 80 MG tablet, Take 1 tablet (80 mg total) by mouth at bedtime., Disp: 90 tablet, Rfl: 1   Difluprednate 0.05 % EMUL, Administer 1 drop into the left eye daily., Disp: , Rfl:    ezetimibe (ZETIA) 10 MG tablet, Take 1 tablet (10 mg total) by mouth daily., Disp: 90 tablet, Rfl: 3   metoprolol succinate (TOPROL-XL) 50 MG 24 hr tablet, Take 1 tablet (50 mg total) by mouth daily., Disp: 90 tablet, Rfl: 3   Risankizumab-rzaa (SKYRIZI) 150 MG/ML SOSY, Inject into the skin once a week., Disp: , Rfl:    telmisartan (MICARDIS) 80 MG tablet, Take 1 tablet (80 mg total) by mouth daily., Disp: 90 tablet, Rfl: 3   ticagrelor (BRILINTA) 90 MG TABS tablet, Take 1 tablet (90 mg total) by mouth 2 (two) times daily., Disp: 180 tablet, Rfl: 3   triamcinolone cream (KENALOG) 0.1 %, Apply 1 Application topically 2 (two) times daily. (Patient taking differently: Apply 1 Application topically 2 (two) times daily as needed (irritation).), Disp: 30 g, Rfl: 0  Past Medical History: Past Medical History:  Diagnosis Date   Hyperlipidemia    Hypertension    Kidney stone    Retinal detachment     Tobacco Use: Social History   Tobacco Use   Smoking Status Never  Smokeless Tobacco Never    Labs: Review Flowsheet  More data exists      Latest Ref Rng & Units 11/25/2021 02/17/2022 07/21/2022 07/22/2022 09/04/2022  Labs for ITP Cardiac and Pulmonary Rehab  Cholestrol 100 - 199 mg/dL 409  - 811  - 914   LDL (calc) 0 - 99 mg/dL 782  - - - 81   Direct LDL mg/dL - - 956.2  - -  HDL-C >13 mg/dL 08.65  - 78.46  - 39   Trlycerides 0 - 149 mg/dL 962.9  - 528.4  - 99   Hemoglobin A1c 4.8 - 5.6 % - 5.9  - 5.8  -    Capillary Blood Glucose: No results found for: "GLUCAP"   Exercise Target Goals: Exercise Program Goal: Individual exercise prescription set using results from initial 6 min walk test and THRR while considering  patient's activity barriers and safety.   Exercise Prescription Goal: Initial exercise prescription builds to 30-45 minutes a day of aerobic activity, 2-3 days per week.  Home exercise guidelines will be given to patient during program as part of exercise prescription that the participant will acknowledge.  Activity Barriers & Risk Stratification:  Activity Barriers & Cardiac Risk Stratification - 08/10/22 1157       Activity Barriers & Cardiac Risk Stratification   Activity Barriers None    Cardiac Risk Stratification High   <5 METS on  6 Minute Walk:  6 Minute Walk     Row Name 08/10/22 1156         6 Minute Walk   Phase Initial     Distance 1266 feet     Walk Time 6 minutes     # of Rest Breaks 0     MPH 2.79     METS 2.39     RPE 6     Perceived Dyspnea  0     VO2 Peak 9.77     Symptoms No     Resting HR 80 bpm     Resting BP 108/78     Resting Oxygen Saturation  96 %     Exercise Oxygen Saturation  during 6 min walk 98 %     Max Ex. HR 101 bpm     Max Ex. BP 130/82     2 Minute Post BP 116/76              Oxygen Initial Assessment:   Oxygen Re-Evaluation:   Oxygen Discharge (Final Oxygen Re-Evaluation):   Initial Exercise Prescription:  Initial  Exercise Prescription - 08/10/22 1100       Date of Initial Exercise RX and Referring Provider   Date 08/10/22    Referring Provider Gypsy Balsam, MD    Expected Discharge Date 10/23/22      Bike   Level 2    Watts 60    Minutes 15    METs 2.5      Recumbant Elliptical   Level 2    RPM 70    Watts 90    Minutes 15    METs 2.5      Prescription Details   Frequency (times per week) 3    Duration Progress to 30 minutes of continuous aerobic without signs/symptoms of physical distress      Intensity   THRR 40-80% of Max Heartrate 59-118    Ratings of Perceived Exertion 11-13    Perceived Dyspnea 0-4      Progression   Progression Continue progressive overload as per policy without signs/symptoms or physical distress.      Resistance Training   Training Prescription Yes    Weight 3    Reps 10-15             Perform Capillary Blood Glucose checks as needed.  Exercise Prescription Changes:   Exercise Prescription Changes     Row Name 08/19/22 1450 08/31/22 1630 09/09/22 1639 09/25/22 1700       Response to Exercise   Blood Pressure (Admit) 132/80 130/78 132/72 118/78    Blood Pressure (Exercise) 144/80 148/78 140/72 162/68    Blood Pressure (Exit) 118/76 -- 98/70 128/78    Heart Rate (Admit) 67 bpm 56 bpm 56 bpm 53 bpm    Heart Rate (Exercise) 107 bpm 109 bpm 122 bpm 125 bpm    Heart Rate (Exit) 76 bpm 64 bpm 66 bpm 62 bpm    Rating of Perceived Exertion (Exercise) 11 11 10 11     Perceived Dyspnea (Exercise) -- -- -- 0    Symptoms None None None none    Comments Off to a great start with exercise. Reviewed MET's REVD, MET's, goals and home ExRx Reviewed METs    Duration Continue with 30 min of aerobic exercise without signs/symptoms of physical distress. Continue with 30 min of aerobic exercise without signs/symptoms of physical distress. Continue with 30 min of aerobic exercise without signs/symptoms of physical distress.  Continue with 30 min of aerobic  exercise without signs/symptoms of physical distress.    Intensity THRR unchanged THRR unchanged THRR unchanged THRR unchanged      Progression   Progression Continue to progress workloads to maintain intensity without signs/symptoms of physical distress. Continue to progress workloads to maintain intensity without signs/symptoms of physical distress. Continue to progress workloads to maintain intensity without signs/symptoms of physical distress. Continue to progress workloads to maintain intensity without signs/symptoms of physical distress.    Average METs 3.5 3.55 4.77 5.95      Resistance Training   Training Prescription No  Relaxation day, no weights No  Relaxation day, no weights No  Relaxation day, no weights Yes    Weight -- -- -- 4    Reps -- -- -- 10-15    Time -- -- -- 10 Minutes      Interval Training   Interval Training No No No No      Bike   Level 2 2 3 4     Watts 30 37 65 82    Minutes 15 66 68 76    METs 3.2 3.4 6 8       Recumbant Elliptical   Level 2 2 2 4     RPM 56 60 59 60    Watts 70 77 98 86    Minutes 15 15 15 15     METs 3.9 3.7 4.4 3.9      Home Exercise Plan   Plans to continue exercise at -- -- [employer] (comment) [employer] (comment)    Frequency -- -- Add 4 additional days to program exercise sessions. Add 4 additional days to program exercise sessions.    Initial Home Exercises Provided -- -- 09/09/22 09/09/22             Exercise Comments:   Exercise Comments     Row Name 08/19/22 1554 08/31/22 1630 09/09/22 1647 09/25/22 1704     Exercise Comments Patient tolerated 1st session of exercise well without symptoms. Reviewed MET's with pt today. Pt tolerated exercise well with an average MET level of 3.55. Pt is progressing well so far will continue to monitor and progress as tolerated without sign or symptom Reviewed MET's, goals and home ExRx. Pt tolerated exercise well with an average MET level of 4.77. Pt will add in  exercising on her own by biking 1-2 days for 30-45 mins per session. Pt feels good about his goals of returning to biking now and he now knows his limits to exercise. He also feels an increase in strength, stamina and has taken some new eating habits from our RD. Overall pt is feeling good and progressing well Reviewed METs with pt today. Pt is tolerating exercise well with an avg MET level of 5.95. He continues to steadily progress his workloads on the octane and upright bike and is feeling good about his progress. Will continue to progress workloads as tolerated without s/sx.             Exercise Goals and Review:   Exercise Goals     Row Name 08/10/22 1027             Exercise Goals   Increase Physical Activity Yes       Intervention Provide advice, education, support and counseling about physical activity/exercise needs.;Develop an individualized exercise prescription for aerobic and resistive training based on initial evaluation findings, risk stratification, comorbidities and participant's personal goals.       Expected  Outcomes Short Term: Attend rehab on a regular basis to increase amount of physical activity.;Long Term: Exercising regularly at least 3-5 days a week.;Long Term: Add in home exercise to make exercise part of routine and to increase amount of physical activity.       Increase Strength and Stamina Yes       Intervention Provide advice, education, support and counseling about physical activity/exercise needs.;Develop an individualized exercise prescription for aerobic and resistive training based on initial evaluation findings, risk stratification, comorbidities and participant's personal goals.       Expected Outcomes Short Term: Increase workloads from initial exercise prescription for resistance, speed, and METs.;Short Term: Perform resistance training exercises routinely during rehab and add in resistance training at home;Long Term: Improve cardiorespiratory fitness,  muscular endurance and strength as measured by increased METs and functional capacity ( )       Able to understand and use rate of perceived exertion (RPE) scale Yes       Intervention Provide education and explanation on how to use RPE scale       Expected Outcomes Short Term: Able to use RPE daily in rehab to express subjective intensity level;Long Term:  Able to use RPE to guide intensity level when exercising independently       Knowledge and understanding of Target Heart Rate Range (THRR) Yes       Intervention Provide education and explanation of THRR including how the numbers were predicted and where they are located for reference       Expected Outcomes Short Term: Able to use daily as guideline for intensity in rehab;Long Term: Able to use THRR to govern intensity when exercising independently;Short Term: Able to state/look up THRR       Understanding of Exercise Prescription Yes       Intervention Provide education, explanation, and written materials on patient's individual exercise prescription       Expected Outcomes Long Term: Able to explain home exercise prescription to exercise independently;Short Term: Able to explain program exercise prescription                Exercise Goals Re-Evaluation :  Exercise Goals Re-Evaluation     Row Name 08/19/22 1554 09/09/22 1642           Exercise Goal Re-Evaluation   Exercise Goals Review Increase Physical Activity;Able to understand and use rate of perceived exertion (RPE) scale;Increase Strength and Stamina Increase Physical Activity;Able to understand and use rate of perceived exertion (RPE) scale;Increase Strength and Stamina      Comments Patient able to understand and use RPE scale appropriately. Reviewed MEt's, goals and home ExRx. Pt tolerated exercise well with an average MET level of 4.77. Pt will add in exercising on her own by biking 1-2 days for 30-45 mins per session. Pt feels good about his goals of returning to biking  now and he now knows his limits to exercise. He also feels an increase in strength, stamina and has taken some new eating habits from our RD. Overall pt is feeling good and progressing well      Expected Outcomes Progress workloads as tolerated to help increase strength and stamina. Progress workloads as tolerated to help increase strength and stamina.               Discharge Exercise Prescription (Final Exercise Prescription Changes):  Exercise Prescription Changes - 09/25/22 1700       Response to Exercise   Blood Pressure (Admit) 118/78  Blood Pressure (Exercise) 162/68    Blood Pressure (Exit) 128/78    Heart Rate (Admit) 53 bpm    Heart Rate (Exercise) 125 bpm    Heart Rate (Exit) 62 bpm    Rating of Perceived Exertion (Exercise) 11    Perceived Dyspnea (Exercise) 0    Symptoms none    Comments Reviewed METs    Duration Continue with 30 min of aerobic exercise without signs/symptoms of physical distress.    Intensity THRR unchanged      Progression   Progression Continue to progress workloads to maintain intensity without signs/symptoms of physical distress.    Average METs 5.95      Resistance Training   Training Prescription Yes    Weight 4    Reps 10-15    Time 10 Minutes      Interval Training   Interval Training No      Bike   Level 4    Watts 82    Minutes 76    METs 8      Recumbant Elliptical   Level 4    RPM 60    Watts 86    Minutes 15    METs 3.9      Home Exercise Plan   Plans to continue exercise at [employer] (comment)    Frequency Add 4 additional days to program exercise sessions.    Initial Home Exercises Provided 09/09/22             Nutrition:  Target Goals: Understanding of nutrition guidelines, daily intake of sodium 1500mg , cholesterol 200mg , calories 30% from fat and 7% or less from saturated fats, daily to have 5 or more servings of fruits and vegetables.  Biometrics:  Pre Biometrics - 08/10/22 1024        Pre Biometrics   Waist Circumference 34.5 inches    Hip Circumference 39 inches    Waist to Hip Ratio 0.88 %    Triceps Skinfold 12 mm    % Body Fat 23.8 %    Grip Strength 15 kg    Flexibility 36 in    Single Leg Stand 4.68 seconds              Nutrition Therapy Plan and Nutrition Goals:  Nutrition Therapy & Goals - 09/17/22 1540       Nutrition Therapy   Diet Heart Healthy Diet    Drug/Food Interactions Statins/Certain Fruits      Personal Nutrition Goals   Nutrition Goal Patient to identify strategies for reducing cardiovascular risk by attending the [employer] education and nutrition series weekly.    Personal Goal #2 Patient to improve diet quality by using the plate method as a guide for meal planning to include lean protein/plant protein, fruits, vegetables, whole grains, nonfat dairy as part of a well-balanced diet.    Personal Goal #3 Patient to reduce sodium to 1500mg  per day    Comments Goals in action. Thien continues to attend the [employer] and nutrition series regularly. He has started making many dietary changes including increased dietary fiber, decreased saturated fat, implemented omega 3 food sources, and reduced sodium intake. LDL remains >70; statin was increased at 09/04/22 follow-up appointment. His wife and daughters remain an excellent support. Patient will benefit from participation in intensive cardiac rehab for nutrition, exercise, and lifestyle modification.      Intervention Plan   Intervention Prescribe, educate and counsel regarding individualized specific dietary modifications aiming towards targeted core components such as weight,  hypertension, lipid management, diabetes, heart failure and other comorbidities.;Nutrition handout(s) given to patient.    Expected Outcomes Short Term Goal: Understand basic principles of dietary content, such as calories, fat, sodium, cholesterol and nutrients.;Long Term Goal: Adherence to prescribed nutrition  plan.             Nutrition Assessments:  Nutrition Assessments - 08/21/22 0958       Rate Your Plate Scores   Pre Score 55            MEDIFICTS Score Key: ?70 Need to make dietary changes  40-70 Heart Healthy Diet ? 40 Therapeutic Level Cholesterol Diet   Flowsheet Row INTENSIVE CARDIAC REHAB from 08/19/2022 in [facility] for Heart, Vascular, & Lung Health  Picture Your Plate Total Score on Admission 55      Picture Your Plate Scores: <16 Unhealthy dietary pattern with much room for improvement. 41-50 Dietary pattern unlikely to meet recommendations for good health and room for improvement. 51-60 More healthful dietary pattern, with some room for improvement.  >60 Healthy dietary pattern, although there may be some specific behaviors that could be improved.    Nutrition Goals Re-Evaluation:  Nutrition Goals Re-Evaluation     Row Name 08/20/22 0833 09/17/22 1540           Goals   Current Weight 177 lb 0.5 oz (80.3 kg) 173 lb 4.5 oz (78.6 kg)      Comment A1c 5.8, lipoproteinA 256.8, LDL 131, triglycerides 205, total cholesterol 225 Lipids WNL -LDL 81, HDL 39; hepatic panel WNL; he is down 3# since starting with our program.      Expected Outcome Patient will benefit from participation in intensive cardiac rehab for nutrition, exercise, and lifestyle modification. Goals in action. Chandon continues to attend the [employer] and nutrition series regularly. He has started making many dietary changes including increased dietary fiber, decreased saturated fat, implemented omega 3 food sources, and reduced sodium intake. LDL remains >70; statin was increased at 09/04/22 follow-up appointment. His wife and daughters remain an excellent support. Patient will benefit from participation in intensive cardiac rehab for nutrition, exercise, and lifestyle modification.               Nutrition Goals Re-Evaluation:  Nutrition Goals Re-Evaluation      Row Name 08/20/22 0833 09/17/22 1540           Goals   Current Weight 177 lb 0.5 oz (80.3 kg) 173 lb 4.5 oz (78.6 kg)      Comment A1c 5.8, lipoproteinA 256.8, LDL 131, triglycerides 205, total cholesterol 225 Lipids WNL -LDL 81, HDL 39; hepatic panel WNL; he is down 3# since starting with our program.      Expected Outcome Patient will benefit from participation in intensive cardiac rehab for nutrition, exercise, and lifestyle modification. Goals in action. Jariel continues to attend the [employer] and nutrition series regularly. He has started making many dietary changes including increased dietary fiber, decreased saturated fat, implemented omega 3 food sources, and reduced sodium intake. LDL remains >70; statin was increased at 09/04/22 follow-up appointment. His wife and daughters remain an excellent support. Patient will benefit from participation in intensive cardiac rehab for nutrition, exercise, and lifestyle modification.               Nutrition Goals Discharge (Final Nutrition Goals Re-Evaluation):  Nutrition Goals Re-Evaluation - 09/17/22 1540       Goals   Current Weight 173 lb 4.5 oz (  78.6 kg)    Comment Lipids WNL -LDL 81, HDL 39; hepatic panel WNL; he is down 3# since starting with our program.    Expected Outcome Goals in action. Logann continues to attend the [employer] and nutrition series regularly. He has started making many dietary changes including increased dietary fiber, decreased saturated fat, implemented omega 3 food sources, and reduced sodium intake. LDL remains >70; statin was increased at 09/04/22 follow-up appointment. His wife and daughters remain an excellent support. Patient will benefit from participation in intensive cardiac rehab for nutrition, exercise, and lifestyle modification.             Psychosocial: Target Goals: Acknowledge presence or absence of significant depression and/or stress, maximize coping skills, provide  positive support system. Participant is able to verbalize types and ability to use techniques and skills needed for reducing stress and depression.  Initial Review & Psychosocial Screening:  Initial Psych Review & Screening - 08/10/22 1026       Initial Review   Current issues with None Identified      Family Dynamics   Good Support System? Yes   Kuper has his wife and 3 daughters for support     Barriers   Psychosocial barriers to participate in program There are no identifiable barriers or psychosocial needs.      Screening Interventions   Interventions Encouraged to exercise;Provide feedback about the scores to participant    Expected Outcomes Short Term goal: Identification and review with participant of any Quality of Life or Depression concerns found by scoring the questionnaire.;Long Term goal: The participant improves quality of Life and PHQ9 Scores as seen by post scores and/or verbalization of changes             Quality of Life Scores:  Quality of Life - 08/10/22 1201       Quality of Life   Select Quality of Life      Quality of Life Scores   Health/Function Pre 28.43 %    Socioeconomic Pre 27 %    Psych/Spiritual Pre 28.79 %    Family Pre 30 %    GLOBAL Pre 28.44 %            Scores of 19 and below usually indicate a poorer quality of life in these areas.  A difference of  2-3 points is a clinically meaningful difference.  A difference of 2-3 points in the total score of the Quality of Life Index has been associated with significant improvement in overall quality of life, self-image, physical symptoms, and general health in studies assessing change in quality of life.  PHQ-9: Review Flowsheet       08/10/2022 11/03/2021 06/07/2018  Depression screen PHQ 2/9  Decreased Interest 0 0 0  Down, Depressed, Hopeless 0 0 0  PHQ - 2 Score 0 0 0  Altered sleeping 0 - -  Tired, decreased energy 2 - -  Change in appetite 0 - -  Feeling bad or failure about  yourself  0 - -  Trouble concentrating 0 - -  Moving slowly or fidgety/restless 0 - -  Suicidal thoughts 0 - -  PHQ-9 Score 2 - -  Difficult doing work/chores Not difficult at all - -   Interpretation of Total Score  Total Score Depression Severity:  1-4 = Minimal depression, 5-9 = Mild depression, 10-14 = Moderate depression, 15-19 = Moderately severe depression, 20-27 = Severe depression   Psychosocial Evaluation and Intervention:   Psychosocial Re-Evaluation:  Psychosocial Re-Evaluation     Row Name 08/20/22 0818 09/02/22 1741 09/30/22 1314         Psychosocial Re-Evaluation   Current issues with None Identified None Identified None Identified     Interventions Encouraged to attend Cardiac Rehabilitation for the exercise Encouraged to attend Cardiac Rehabilitation for the exercise Encouraged to attend Cardiac Rehabilitation for the exercise     Continue Psychosocial Services  No Follow up required No Follow up required No Follow up required              Psychosocial Discharge (Final Psychosocial Re-Evaluation):  Psychosocial Re-Evaluation - 09/30/22 1314       Psychosocial Re-Evaluation   Current issues with None Identified    Interventions Encouraged to attend Cardiac Rehabilitation for the exercise    Continue Psychosocial Services  No Follow up required             Vocational Rehabilitation: Provide vocational rehab assistance to qualifying candidates.   Vocational Rehab Evaluation & Intervention:  Vocational Rehab - 08/10/22 1027       Initial Vocational Rehab Evaluation & Intervention   Assessment shows need for Vocational Rehabilitation No   Yadier is retired            Education: Education Goals: Education classes will be provided on a weekly basis, covering required topics. Participant will state understanding/return demonstration of topics presented.    Education     Row Name 08/19/22 1600     Education   Cardiac Education Topics  [employer]   Customer service manager   Weekly Topic Comforting Weekend Breakfasts   Instruction Review Code 1- Verbalizes Understanding   Class Start Time 1355   Class Stop Time 1442   Class Time Calculation (min) 47 min    Row Name 08/21/22 1500     Education   Cardiac Education Topics [employer]   Licensed conveyancer Nutrition   Nutrition Dining Out - Part 1   Instruction Review Code 1- Verbalizes Understanding   Class Start Time 1400   Class Stop Time 1445   Class Time Calculation (min) 45 min    Row Name 08/24/22 1400     Education   Cardiac Education Topics [employer]     Core Videos   Educator Dietitian   Select Nutrition   Nutrition Facts on Fat   Instruction Review Code 1- Verbalizes Understanding   Class Start Time 1400   Class Stop Time 1439   Class Time Calculation (min) 39 min    Row Name 08/26/22 1500     Education   Cardiac Education Topics [employer]   Customer service manager   Weekly Topic Fast Evening Meals   Instruction Review Code 1- Verbalizes Understanding   Class Start Time 1400   Class Stop Time 1440   Class Time Calculation (min) 40 min    Row Name 08/28/22 1500     Education   Cardiac Education Topics [employer]   Nurse, children's   Educator Dietitian   [facility]   Nutrition Vitamins and Minerals   Instruction Review Code 1- Verbalizes Understanding   Class Start Time 1400   Class Stop Time 1440   Class Time Calculation (min) 40 min    Row Name 08/31/22 1700  Education   Cardiac Education Topics Pritikin   Geographical information systems officer Psychosocial   Psychosocial Workshop Healthy Sleep for a Healthy Heart   Instruction Review Code 1- Verbalizes Understanding   Class Start Time 1405   Class Stop Time 1502   Class Time Calculation  (min) 57 min    Row Name 09/02/22 1500     Education   Cardiac Education Topics Pritikin   Customer service manager   Weekly Topic International Cuisine- Spotlight on the [employer] Zones   Instruction Review Code 1- Verbalizes Understanding   Class Start Time 1355   Class Stop Time 1430   Class Time Calculation (min) 35 min    Row Name 09/04/22 1400     Education   Cardiac Education Topics Pritikin   Psychologist, forensic Exercise Education   Exercise Education Improving Performance   Instruction Review Code 1- Verbalizes Understanding   Class Start Time 1400   Class Stop Time 1432   Class Time Calculation (min) 32 min    Row Name 09/07/22 1600     Education   Cardiac Education Topics Pritikin   Glass blower/designer Nutrition   Nutrition Workshop Fueling a Forensic psychologist   Instruction Review Code 1- Tax inspector   Class Start Time 1400   Class Stop Time 1453   Class Time Calculation (min) 53 min    Row Name 09/09/22 1500     Education   Cardiac Education Topics Pritikin   Customer service manager   Weekly Topic Simple Sides and Sauces   Instruction Review Code 1- Verbalizes Understanding   Class Start Time 1350   Class Stop Time 1425   Class Time Calculation (min) 35 min    Row Name 09/11/22 1500     Education   Cardiac Education Topics Pritikin   Nurse, children's Exercise Physiologist   Select Psychosocial   Psychosocial How Our Thoughts Can Heal Our Hearts   Instruction Review Code 1- Verbalizes Understanding   Class Start Time 1400   Class Stop Time 1445   Class Time Calculation (min) 45 min    Row Name 09/14/22 1600     Education   Cardiac Education Topics Pritikin   Consulting civil engineer Psychosocial   Psychosocial Workshop From Head to Heart: The Power of a Healthy Outlook   Instruction Review Code 1- Verbalizes Understanding   Class Start Time 1405   Class Stop Time 1502   Class Time Calculation (min) 57 min    Row Name 09/16/22 1500     Education   Cardiac Education Topics Pritikin   Orthoptist   Educator Dietitian   Weekly Topic Powerhouse Plant-Based Proteins   Instruction Review Code 1- Verbalizes Understanding   Class Start Time 1355   Class Stop Time 1436   Class Time Calculation (min) 41 min    Row Name 09/18/22 1400     Education   Cardiac Education Topics Pritikin   Psychologist, sport and exercise     Core  Special educational needs teacher Education   General Education Hypertension and Heart Disease   Instruction Review Code 1- Verbalizes Understanding   Class Start Time 1400   Class Stop Time 1442   Class Time Calculation (min) 42 min    Row Name 09/23/22 1500     Education   Cardiac Education Topics Pritikin   Customer service manager   Weekly Topic Adding Flavor - Sodium-Free   Instruction Review Code 1- Verbalizes Understanding   Class Start Time 1400   Class Stop Time 1445   Class Time Calculation (min) 45 min    Row Name 09/25/22 1600     Education   Cardiac Education Topics Pritikin   Engineer, mining Education   General Education Heart Disease Risk Reduction   Instruction Review Code 1- Verbalizes Understanding   Class Start Time 1405   Class Stop Time 1450   Class Time Calculation (min) 45 min    Row Name 09/28/22 1600     Education   Cardiac Education Topics Pritikin   Select Workshops     Workshops   Educator Exercise Physiologist   Select Exercise   Exercise Workshop Location manager and Fall Prevention   Instruction Review Code 1- Verbalizes Understanding   Class  Start Time 1405   Class Stop Time 1445   Class Time Calculation (min) 40 min            Core Videos: Exercise    Move It!  Clinical staff conducted group or individual video education with verbal and written material and guidebook.  Patient learns the recommended Pritikin exercise program. Exercise with the goal of living a long, healthy life. Some of the health benefits of exercise include controlled diabetes, healthier blood pressure levels, improved cholesterol levels, improved heart and lung capacity, improved sleep, and better body composition. Everyone should speak with their doctor before starting or changing an exercise routine.  Biomechanical Limitations Clinical staff conducted group or individual video education with verbal and written material and guidebook.  Patient learns how biomechanical limitations can impact exercise and how we can mitigate and possibly overcome limitations to have an impactful and balanced exercise routine.  Body Composition Clinical staff conducted group or individual video education with verbal and written material and guidebook.  Patient learns that body composition (ratio of muscle mass to fat mass) is a key component to assessing overall fitness, rather than body weight alone. Increased fat mass, especially visceral belly fat, can put [place] at increased risk for metabolic syndrome, type 2 diabetes, heart disease, and even death. It is recommended to combine diet and exercise (cardiovascular and resistance training) to improve your body composition. Seek guidance from your physician and exercise physiologist before implementing an exercise routine.  Exercise Action Plan Clinical staff conducted group or individual video education with verbal and written material and guidebook.  Patient learns the recommended strategies to achieve and enjoy long-term exercise adherence, including variety, self-motivation, self-efficacy, and positive decision making.  Benefits of exercise include fitness, good health, weight management, more energy, better sleep, less stress, and overall well-being.  Medical   Heart Disease Risk Reduction Clinical staff conducted group or individual video education with verbal and written material and guidebook.  Patient learns our heart is our most vital organ as it circulates oxygen, nutrients, white blood cells,  and hormones throughout the entire body, and carries waste away. Data supports a plant-based eating plan like the Pritikin Program for its effectiveness in slowing progression of and reversing heart disease. The video provides a number of recommendations to address heart disease.   Metabolic Syndrome and Belly Fat  Clinical staff conducted group or individual video education with verbal and written material and guidebook.  Patient learns what metabolic syndrome is, how it leads to heart disease, and how one can reverse it and keep it from coming back. You have metabolic syndrome if you have 3 of the following 5 criteria: abdominal obesity, high blood pressure, high triglycerides, low HDL cholesterol, and high blood sugar.  Hypertension and Heart Disease Clinical staff conducted group or individual video education with verbal and written material and guidebook.  Patient learns that high blood pressure, or hypertension, is very common in the [place]. Hypertension is largely due to excessive salt intake, but other important risk factors include being overweight, physical inactivity, drinking too much alcohol, smoking, and not eating enough potassium from fruits and vegetables. High blood pressure is a leading risk factor for heart attack, stroke, congestive heart failure, dementia, kidney failure, and premature death. Long-term effects of excessive salt intake include stiffening of the arteries and thickening of heart muscle and organ damage. Recommendations include ways to reduce hypertension and the risk of heart  disease.  Diseases of Our Time - Focusing on Diabetes Clinical staff conducted group or individual video education with verbal and written material and guidebook.  Patient learns why the best way to stop diseases of our time is prevention, through food and other lifestyle changes. Medicine (such as prescription pills and surgeries) is often only a Band-Aid on the problem, not a long-term solution. Most common diseases of our time include obesity, type 2 diabetes, hypertension, heart disease, and cancer. The Pritikin Program is recommended and has been proven to help reduce, reverse, and/or prevent the damaging effects of metabolic syndrome.  Nutrition   Overview of the Pritikin Eating Plan  Clinical staff conducted group or individual video education with verbal and written material and guidebook.  Patient learns about the Pritikin Eating Plan for disease risk reduction. The Pritikin Eating Plan emphasizes a wide variety of unrefined, minimally-processed carbohydrates, like fruits, vegetables, whole grains, and legumes. Go, Caution, and Stop food choices are explained. Plant-based and lean animal proteins are emphasized. Rationale provided for low sodium intake for blood pressure control, low added sugars for blood sugar stabilization, and low added fats and oils for coronary artery disease risk reduction and weight management.  Calorie Density  Clinical staff conducted group or individual video education with verbal and written material and guidebook.  Patient learns about calorie density and how it impacts the Pritikin Eating Plan. Knowing the characteristics of the food you choose will help you decide whether those foods will lead to weight gain or weight loss, and whether you want to consume more or less of them. Weight loss is usually a side effect of the Pritikin Eating Plan because of its focus on low calorie-dense foods.  Label Reading  Clinical staff conducted group or individual video  education with verbal and written material and guidebook.  Patient learns about the Pritikin recommended label reading guidelines and corresponding recommendations regarding calorie density, added sugars, sodium content, and whole grains.  Dining Out - Part 1  Clinical staff conducted group or individual video education with verbal and written material and guidebook.  Patient learns that restaurant  meals can be sabotaging because they can be so high in calories, fat, sodium, and/or sugar. Patient learns recommended strategies on how to positively address this and avoid unhealthy pitfalls.  Facts on Fats  Clinical staff conducted group or individual video education with verbal and written material and guidebook.  Patient learns that lifestyle modifications can be just as effective, if not more so, as many medications for lowering your risk of heart disease. A Pritikin lifestyle can help to reduce your risk of inflammation and atherosclerosis (cholesterol build-up, or plaque, in the artery walls). Lifestyle interventions such as dietary choices and physical activity address the cause of atherosclerosis. A review of the types of fats and their impact on blood cholesterol levels, along with dietary recommendations to reduce fat intake is also included.  Nutrition Action Plan  Clinical staff conducted group or individual video education with verbal and written material and guidebook.  Patient learns how to incorporate Pritikin recommendations into their lifestyle. Recommendations include planning and keeping personal health goals in mind as an important part of their success.  Healthy Mind-Set    Healthy Minds, Bodies, Hearts  Clinical staff conducted group or individual video education with verbal and written material and guidebook.  Patient learns how to identify when they are stressed. Video will discuss the impact of that stress, as well as the many benefits of stress management. Patient will also  be introduced to stress management techniques. The way we think, act, and feel has an impact on our hearts.  How Our Thoughts Can Heal Our Hearts  Clinical staff conducted group or individual video education with verbal and written material and guidebook.  Patient learns that negative thoughts can cause depression and anxiety. This can result in negative lifestyle behavior and serious health problems. Cognitive behavioral therapy is an effective method to help control our thoughts in order to change and improve our emotional outlook.  Additional Videos:  Exercise    Improving Performance  Clinical staff conducted group or individual video education with verbal and written material and guidebook.  Patient learns to use a non-linear approach by alternating intensity levels and lengths of time spent exercising to help burn more calories and lose more body fat. Cardiovascular exercise helps improve heart health, metabolism, hormonal balance, blood sugar control, and recovery from fatigue. Resistance training improves strength, endurance, balance, coordination, reaction time, metabolism, and muscle mass. Flexibility exercise improves circulation, posture, and balance. Seek guidance from your physician and exercise physiologist before implementing an exercise routine and learn your capabilities and proper form for all exercise.  Introduction to Yoga  Clinical staff conducted group or individual video education with verbal and written material and guidebook.  Patient learns about yoga, a discipline of the coming together of mind, breath, and body. The benefits of yoga include improved flexibility, improved range of motion, better posture and core strength, increased lung function, weight loss, and positive self-image. Yoga's heart health benefits include lowered blood pressure, healthier heart rate, decreased cholesterol and triglyceride levels, improved immune function, and reduced stress. Seek guidance  from your physician and exercise physiologist before implementing an exercise routine and learn your capabilities and proper form for all exercise.  Medical   Aging: Enhancing Your Quality of Life  Clinical staff conducted group or individual video education with verbal and written material and guidebook.  Patient learns key strategies and recommendations to stay in good physical health and enhance quality of life, such as prevention strategies, having an advocate, securing a Health Care  Proxy and Power of Attorney, and keeping a list of medications and system for tracking them. It also discusses how to avoid risk for bone loss.  Biology of Weight Control  Clinical staff conducted group or individual video education with verbal and written material and guidebook.  Patient learns that weight gain occurs because we consume more calories than we burn (eating more, moving less). Even if your body weight is normal, you may have higher ratios of fat compared to muscle mass. Too much body fat puts you at increased risk for cardiovascular disease, heart attack, stroke, type 2 diabetes, and obesity-related cancers. In addition to exercise, following the Pritikin Eating Plan can help reduce your risk.  Decoding Lab Results  Clinical staff conducted group or individual video education with verbal and written material and guidebook.  Patient learns that lab test reflects one measurement whose values change over time and are influenced by many factors, including medication, stress, sleep, exercise, food, hydration, pre-existing medical conditions, and more. It is recommended to use the knowledge from this video to become more involved with your lab results and evaluate your numbers to speak with your doctor.   Diseases of Our Time - Overview  Clinical staff conducted group or individual video education with verbal and written material and guidebook.  Patient learns that according to the CDC, 50% to 70% of  chronic diseases (such as obesity, type 2 diabetes, elevated lipids, hypertension, and heart disease) are avoidable through lifestyle improvements including healthier food choices, listening to satiety cues, and increased physical activity.  Sleep Disorders Clinical staff conducted group or individual video education with verbal and written material and guidebook.  Patient learns how good quality and duration of sleep are important to overall health and well-being. Patient also learns about sleep disorders and how they impact health along with recommendations to address them, including discussing with a physician.  Nutrition  Dining Out - Part 2 Clinical staff conducted group or individual video education with verbal and written material and guidebook.  Patient learns how to plan ahead and communicate in order to maximize their dining experience in a healthy and nutritious manner. Included are recommended food choices based on the type of restaurant the patient is visiting.   Fueling a Banker conducted group or individual video education with verbal and written material and guidebook.  There is a strong connection between our food choices and our health. Diseases like obesity and type 2 diabetes are very prevalent and are in large-part due to lifestyle choices. The Pritikin Eating Plan provides plenty of food and hunger-curbing satisfaction. It is easy to follow, affordable, and helps reduce health risks.  Menu Workshop  Clinical staff conducted group or individual video education with verbal and written material and guidebook.  Patient learns that restaurant meals can sabotage health goals because they are often packed with calories, fat, sodium, and sugar. Recommendations include strategies to plan ahead and to communicate with the manager, chef, or server to help order a healthier meal.  Planning Your Eating Strategy  Clinical staff conducted group or individual video  education with verbal and written material and guidebook.  Patient learns about the Pritikin Eating Plan and its benefit of reducing the risk of disease. The Pritikin Eating Plan does not focus on calories. Instead, it emphasizes high-quality, nutrient-rich foods. By knowing the characteristics of the foods, we choose, we can determine their calorie density and make informed decisions.  Targeting Your Nutrition Priorities  Clinical staff  conducted group or individual video education with verbal and written material and guidebook.  Patient learns that lifestyle habits have a tremendous impact on disease risk and progression. This video provides eating and physical activity recommendations based on your personal health goals, such as reducing LDL cholesterol, losing weight, preventing or controlling type 2 diabetes, and reducing high blood pressure.  Vitamins and Minerals  Clinical staff conducted group or individual video education with verbal and written material and guidebook.  Patient learns different ways to obtain key vitamins and minerals, including through a recommended healthy diet. It is important to discuss all supplements you take with your doctor.   Healthy Mind-Set    Smoking Cessation  Clinical staff conducted group or individual video education with verbal and written material and guidebook.  Patient learns that cigarette smoking and tobacco addiction pose a serious health risk which affects millions of people. Stopping smoking will significantly reduce the risk of heart disease, lung disease, and many forms of cancer. Recommended strategies for quitting are covered, including working with your doctor to develop a successful plan.  Culinary   Becoming a Set designer conducted group or individual video education with verbal and written material and guidebook.  Patient learns that cooking at home can be healthy, cost-effective, quick, and puts them in control. Keys to  cooking healthy recipes will include looking at your recipe, assessing your equipment needs, planning ahead, making it simple, choosing cost-effective seasonal ingredients, and limiting the use of added fats, salts, and sugars.  Cooking - Breakfast and Snacks  Clinical staff conducted group or individual video education with verbal and written material and guidebook.  Patient learns how important breakfast is to satiety and nutrition through the entire day. Recommendations include key foods to eat during breakfast to help stabilize blood sugar levels and to prevent overeating at meals later in the day. Planning ahead is also a key component.  Cooking - Educational psychologist conducted group or individual video education with verbal and written material and guidebook.  Patient learns eating strategies to improve overall health, including an approach to cook more at home. Recommendations include thinking of animal protein as a side on your plate rather than center stage and focusing instead on lower calorie dense options like vegetables, fruits, whole grains, and plant-based proteins, such as beans. Making sauces in large quantities to freeze for later and leaving the skin on your vegetables are also recommended to maximize your experience.  Cooking - Healthy Salads and Dressing Clinical staff conducted group or individual video education with verbal and written material and guidebook.  Patient learns that vegetables, fruits, whole grains, and legumes are the foundations of the Pritikin Eating Plan. Recommendations include how to incorporate each of these in flavorful and healthy salads, and how to create homemade salad dressings. Proper handling of ingredients is also covered. Cooking - Soups and [employer] - Soups and Desserts Clinical staff conducted group or individual video education with verbal and written material and guidebook.  Patient learns that Pritikin soups and desserts  make for easy, nutritious, and delicious snacks and meal components that are low in sodium, fat, sugar, and calorie density, while high in vitamins, minerals, and filling fiber. Recommendations include simple and healthy ideas for soups and desserts.   Overview     The Pritikin Solution Program Overview Clinical staff conducted group or individual video education with verbal and written material and guidebook.  Patient learns that the results  of the Pritikin Program have been documented in more than 100 articles published in peer-reviewed journals, and the benefits include reducing risk factors for (and, in some cases, even reversing) high cholesterol, high blood pressure, type 2 diabetes, obesity, and more! An overview of the three key pillars of the Pritikin Program will be covered: eating well, doing regular exercise, and having a healthy mind-set.  WORKSHOPS  Exercise: Exercise Basics: Building Your Action Plan Clinical staff led group instruction and group discussion with PowerPoint presentation and patient guidebook. To enhance the learning environment the use of posters, models and videos may be added. At the conclusion of this workshop, patients will comprehend the difference between physical activity and exercise, as well as the benefits of incorporating both, into their routine. Patients will understand the FITT (Frequency, Intensity, Time, and Type) principle and how to use it to build an exercise action plan. In addition, safety concerns and other considerations for exercise and cardiac rehab will be addressed by the presenter. The purpose of this lesson is to promote a comprehensive and effective weekly exercise routine in order to improve patients' overall level of fitness.   Managing Heart Disease: Your Path to a Healthier Heart Clinical staff led group instruction and group discussion with PowerPoint presentation and patient guidebook. To enhance the learning environment the use  of posters, models and videos may be added.At the conclusion of this workshop, patients will understand the anatomy and physiology of the heart. Additionally, they will understand how Pritikin's three pillars impact the risk factors, the progression, and the management of heart disease.  The purpose of this lesson is to provide a high-level overview of the heart, heart disease, and how the Pritikin lifestyle positively impacts risk factors.  Exercise Biomechanics Clinical staff led group instruction and group discussion with PowerPoint presentation and patient guidebook. To enhance the learning environment the use of posters, models and videos may be added. Patients will learn how the structural parts of their bodies function and how these functions impact their daily activities, movement, and exercise. Patients will learn how to promote a neutral spine, learn how to manage pain, and identify ways to improve their physical movement in order to promote healthy living. The purpose of this lesson is to expose patients to common physical limitations that impact physical activity. Participants will learn practical ways to adapt and manage aches and pains, and to minimize their effect on regular exercise. Patients will learn how to maintain good posture while sitting, walking, and lifting.  Balance Training and Fall Prevention  Clinical staff led group instruction and group discussion with PowerPoint presentation and patient guidebook. To enhance the learning environment the use of posters, models and videos may be added. At the conclusion of this workshop, patients will understand the importance of their sensorimotor skills (vision, proprioception, and the vestibular system) in maintaining their ability to balance as they age. Patients will apply a variety of balancing exercises that are appropriate for their current level of function. Patients will understand the common causes for poor balance,  possible solutions to these problems, and ways to modify their physical environment in order to minimize their fall risk. The purpose of this lesson is to teach patients about the importance of maintaining balance as they age and ways to minimize their risk of falling.  WORKSHOPS   Nutrition:  Fueling a Ship broker led group instruction and group discussion with PowerPoint presentation and patient guidebook. To enhance the learning environment the use of posters,  models and videos may be added. Patients will review the foundational principles of the Pritikin Eating Plan and understand what constitutes a serving size in each of the food groups. Patients will also learn Pritikin-friendly foods that are better choices when away from home and review make-ahead meal and snack options. Calorie density will be reviewed and applied to three nutrition priorities: weight maintenance, weight loss, and weight gain. The purpose of this lesson is to reinforce (in a group setting) the key concepts around what patients are recommended to eat and how to apply these guidelines when away from home by planning and selecting Pritikin-friendly options. Patients will understand how calorie density may be adjusted for different weight management goals.  Mindful Eating  Clinical staff led group instruction and group discussion with PowerPoint presentation and patient guidebook. To enhance the learning environment the use of posters, models and videos may be added. Patients will briefly review the concepts of the Pritikin Eating Plan and the importance of low-calorie dense foods. The concept of mindful eating will be introduced as well as the importance of paying attention to internal hunger signals. Triggers for non-hunger eating and techniques for dealing with triggers will be explored. The purpose of this lesson is to provide patients with the opportunity to review the basic principles of the Pritikin Eating  Plan, discuss the value of eating mindfully and how to measure internal cues of hunger and fullness using the Hunger Scale. Patients will also discuss reasons for non-hunger eating and learn strategies to use for controlling emotional eating.  Targeting Your Nutrition Priorities Clinical staff led group instruction and group discussion with PowerPoint presentation and patient guidebook. To enhance the learning environment the use of posters, models and videos may be added. Patients will learn how to determine their genetic susceptibility to disease by reviewing their family history. Patients will gain insight into the importance of diet as part of an overall healthy lifestyle in mitigating the impact of genetics and other environmental insults. The purpose of this lesson is to provide patients with the opportunity to assess their personal nutrition priorities by looking at their family history, their own health history and current risk factors. Patients will also be able to discuss ways of prioritizing and modifying the Pritikin Eating Plan for their highest risk areas  Menu  Clinical staff led group instruction and group discussion with PowerPoint presentation and patient guidebook. To enhance the learning environment the use of posters, models and videos may be added. Using menus brought in from [employer], or printed from [employer], patients will apply the Pritikin dining out guidelines that were presented in the [employer] video. Patients will also be able to practice these guidelines in a variety of provided scenarios. The purpose of this lesson is to provide patients with the opportunity to practice hands-on learning of the Pritikin Dining Out guidelines with actual menus and practice scenarios.  Label Reading Clinical staff led group instruction and group discussion with PowerPoint presentation and patient guidebook. To enhance the learning environment the use of  posters, models and videos may be added. Patients will review and discuss the Pritikin label reading guidelines presented in [employer]ritikin's Label Reading Educational series video. Using fool labels brought in from local grocery stores and markets, patients will apply the label reading guidelines and determine if the packaged food meet the Pritikin guidelines. The purpose of this lesson is to provide patients with the opportunity to review, discuss, and practice hands-on learning of the Pritikin  Label Reading guidelines with actual packaged food labels. Cooking School  Pritikin's [employer] are designed to teach patients ways to prepare quick, simple, and affordable recipes at home. The importance of nutrition's role in chronic disease risk reduction is reflected in its emphasis in the overall Pritikin program. By learning how to prepare essential core Pritikin Eating Plan recipes, patients will increase control over what they eat; be able to customize the flavor of foods without the use of added salt, sugar, or fat; and improve the quality of the food they consume. By learning a set of core recipes which are easily assembled, quickly prepared, and affordable, patients are more likely to prepare more healthy foods at home. These workshops focus on convenient breakfasts, simple entres, side dishes, and desserts which can be prepared with minimal effort and are consistent with nutrition recommendations for cardiovascular risk reduction. Cooking [employer] are taught by a Armed forces logistics/support/administrative officer (RD) who has been trained by the [employer]. The chef or RD has a clear understanding of the importance of minimizing - if not completely eliminating - added fat, sugar, and sodium in recipes. Throughout the series of Cooking School Workshop sessions, patients will learn about healthy ingredients and efficient methods of cooking to build confidence in their capability to  prepare    Cooking School weekly topics:  Adding Flavor- Sodium-Free  Fast and Healthy Breakfasts  Powerhouse Plant-Based Proteins  Satisfying Salads and Dressings  Simple Sides and Sauces  International Cuisine-Spotlight on the [employer] Zones  Delicious Desserts  Savory Soups  [employer] - Meals in a Astronomer Appetizers and Snacks  Comforting Weekend Breakfasts  One-Pot Wonders   Fast Evening Meals  Landscape architect Your Pritikin Plate  WORKSHOPS   Healthy Mindset (Psychosocial):  Focused Goals, Sustainable Changes Clinical staff led group instruction and group discussion with PowerPoint presentation and patient guidebook. To enhance the learning environment the use of posters, models and videos may be added. Patients will be able to apply effective goal setting strategies to establish at least one personal goal, and then take consistent, meaningful action toward that goal. They will learn to identify common barriers to achieving personal goals and develop strategies to overcome them. Patients will also gain an understanding of how our mind-set can impact our ability to achieve goals and the importance of cultivating a positive and growth-oriented mind-set. The purpose of this lesson is to provide patients with a deeper understanding of how to set and achieve personal goals, as well as the tools and strategies needed to overcome common obstacles which may arise along the way.  From Head to Heart: The Power of a Healthy Outlook  Clinical staff led group instruction and group discussion with PowerPoint presentation and patient guidebook. To enhance the learning environment the use of posters, models and videos may be added. Patients will be able to recognize and describe the impact of emotions and mood on physical health. They will discover the importance of self-care and explore self-care practices which may work for them. Patients will also learn how to utilize  the 4 C's to cultivate a healthier outlook and better manage stress and challenges. The purpose of this lesson is to demonstrate to patients how a healthy outlook is an essential part of maintaining good health, especially as they continue their cardiac rehab journey.  Healthy Sleep for a Healthy Heart Clinical staff led group instruction and group discussion with PowerPoint presentation and patient guidebook. To  enhance the learning environment the use of posters, models and videos may be added. At the conclusion of this workshop, patients will be able to demonstrate knowledge of the importance of sleep to overall health, well-being, and quality of life. They will understand the symptoms of, and treatments for, common sleep disorders. Patients will also be able to identify daytime and nighttime behaviors which impact sleep, and they will be able to apply these tools to help manage sleep-related challenges. The purpose of this lesson is to provide patients with a general overview of sleep and outline the importance of quality sleep. Patients will learn about a few of the most common sleep disorders. Patients will also be introduced to the concept of "sleep hygiene," and discover ways to self-manage certain sleeping problems through simple daily behavior changes. Finally, the workshop will motivate patients by clarifying the links between quality sleep and their goals of heart-healthy living.   Recognizing and Reducing Stress Clinical staff led group instruction and group discussion with PowerPoint presentation and patient guidebook. To enhance the learning environment the use of posters, models and videos may be added. At the conclusion of this workshop, patients will be able to understand the types of stress reactions, differentiate between acute and chronic stress, and recognize the impact that chronic stress has on their health. They will also be able to apply different coping mechanisms, such as reframing  negative self-talk. Patients will have the opportunity to practice a variety of stress management techniques, such as deep abdominal breathing, progressive muscle relaxation, and/or guided imagery.  The purpose of this lesson is to educate patients on the role of stress in their lives and to provide healthy techniques for coping with it.  Learning Barriers/Preferences:  Learning Barriers/Preferences - 08/10/22 1027       Learning Barriers/Preferences   Learning Barriers Sight   wears glasses   Learning Preferences Audio;Computer/Internet;Group Instruction;Individual Instruction;Skilled Demonstration;Verbal Instruction;Video;Written Material;Pictoral             Education Topics:  Knowledge Questionnaire Score:  Knowledge Questionnaire Score - 08/10/22 1201       Knowledge Questionnaire Score   Pre Score 18/24             Core Components/Risk Factors/Patient Goals at Admission:  Personal Goals and Risk Factors at Admission - 08/10/22 1028       Core Components/Risk Factors/Patient Goals on Admission   Hypertension Yes    Intervention Provide education on lifestyle modifcations including regular physical activity/exercise, weight management, moderate sodium restriction and increased consumption of fresh fruit, vegetables, and low fat dairy, alcohol moderation, and smoking cessation.;Monitor prescription use compliance.    Expected Outcomes Long Term: Maintenance of blood pressure at goal levels.;Short Term: Continued assessment and intervention until BP is < 140/44mm HG in hypertensive participants. < 130/62mm HG in hypertensive participants with diabetes, heart failure or chronic kidney disease.    Lipids Yes    Intervention Provide education and support for participant on nutrition & aerobic/resistive exercise along with prescribed medications to achieve LDL 70mg , HDL >40mg .    Expected Outcomes Short Term: Participant states understanding of desired cholesterol values and  is compliant with medications prescribed. Participant is following exercise prescription and nutrition guidelines.;Long Term: Cholesterol controlled with medications as prescribed, with individualized exercise RX and with personalized nutrition plan. Value goals: LDL < 70mg , HDL > 40 mg.             Core Components/Risk Factors/Patient Goals Review:   Goals and Risk Factor Review  Row Name 08/20/22 0818 09/02/22 1742 09/30/22 1319         Core Components/Risk Factors/Patient Goals Review   Personal Goals Review Weight Management/Obesity;Hypertension;Lipids Weight Management/Obesity;Hypertension;Lipids Weight Management/Obesity;Hypertension;Lipids     Review Nazariy started intensive cardiac rehab on 08/19/22. Jhony did well with exercise, vital signs were stable Travanti is doing  well with exercise at intensive cardiac rehab, vital signs have been  stable Tilman continues to  well with exercise at intensive cardiac rehab, vital signs have been  stable. Creeden has lost 2.3 kg since starting the program     Expected Outcomes Romel will continue to participate in intensive cardiac rehab for exercise, nutrition and lifestyle modifications Daltyn will continue to participate in intensive cardiac rehab for exercise, nutrition and lifestyle modifications Claudius will continue to participate in intensive cardiac rehab for exercise, nutrition and lifestyle modifications              Core Components/Risk Factors/Patient Goals at Discharge (Final Review):   Goals and Risk Factor Review - 09/30/22 1319       Core Components/Risk Factors/Patient Goals Review   Personal Goals Review Weight Management/Obesity;Hypertension;Lipids    Review Amit continues to  well with exercise at intensive cardiac rehab, vital signs have been  stable. Asahi has lost 2.3 kg since starting the program    Expected Outcomes Jivan will continue to participate in intensive cardiac rehab for exercise,  nutrition and lifestyle modifications             ITP Comments:  ITP Comments     Row Name 08/10/22 1024 08/20/22 0816 09/02/22 1740 09/30/22 1311     ITP Comments Dr. Armanda Magic medical director. Introduction to [facility] education/ intensive cardiac rehab. Initial orientation packet reviewed with patient. 30 Day ITP Review. Miquel started intensive cardiac rehab on 08/19/22 and did well with exercise 30 Day ITP Review. Cordney has good attendance and participation in intensive cardiac rehab 30 Day ITP Review. Jahaziel continues to have  good attendance and participation in intensive cardiac rehab             Comments: See ITP comments.Thayer Headings RN BSN

## 2022-10-02 ENCOUNTER — Encounter (HOSPITAL_COMMUNITY)
Admission: RE | Admit: 2022-10-02 | Discharge: 2022-10-02 | Disposition: A | Discharge status: Home / Self Care | Payer: Medicare Other | Source: Ambulatory Visit | Attending: Cardiology | Admitting: Cardiology

## 2022-10-02 DIAGNOSIS — I214 Non-ST elevation (NSTEMI) myocardial infarction: Secondary | ICD-10-CM

## 2022-10-05 ENCOUNTER — Encounter (HOSPITAL_COMMUNITY)
Admission: RE | Admit: 2022-10-05 | Discharge: 2022-10-05 | Disposition: A | Discharge status: Home / Self Care | Payer: Medicare Other | Source: Ambulatory Visit | Attending: Cardiology | Admitting: Cardiology

## 2022-10-05 DIAGNOSIS — I214 Non-ST elevation (NSTEMI) myocardial infarction: Secondary | ICD-10-CM | POA: Diagnosis not present

## 2022-10-07 ENCOUNTER — Encounter (HOSPITAL_COMMUNITY)
Admission: RE | Admit: 2022-10-07 | Discharge: 2022-10-07 | Disposition: A | Discharge status: Home / Self Care | Payer: Medicare Other | Source: Ambulatory Visit | Attending: Cardiology | Admitting: Cardiology

## 2022-10-07 DIAGNOSIS — I214 Non-ST elevation (NSTEMI) myocardial infarction: Secondary | ICD-10-CM

## 2022-10-09 ENCOUNTER — Encounter (HOSPITAL_COMMUNITY)
Admission: RE | Admit: 2022-10-09 | Discharge: 2022-10-09 | Disposition: A | Discharge status: Home / Self Care | Payer: Medicare Other | Source: Ambulatory Visit | Attending: Cardiology | Admitting: Cardiology

## 2022-10-09 DIAGNOSIS — I214 Non-ST elevation (NSTEMI) myocardial infarction: Secondary | ICD-10-CM | POA: Diagnosis not present

## 2022-10-12 ENCOUNTER — Encounter (HOSPITAL_COMMUNITY)
Admission: RE | Admit: 2022-10-12 | Discharge: 2022-10-12 | Disposition: A | Discharge status: Home / Self Care | Payer: Medicare Other | Source: Ambulatory Visit | Attending: Cardiology | Admitting: Cardiology

## 2022-10-12 DIAGNOSIS — I214 Non-ST elevation (NSTEMI) myocardial infarction: Secondary | ICD-10-CM

## 2022-10-14 ENCOUNTER — Encounter (HOSPITAL_COMMUNITY)
Admission: RE | Admit: 2022-10-14 | Discharge: 2022-10-14 | Disposition: A | Discharge status: Home / Self Care | Payer: Medicare Other | Source: Ambulatory Visit | Attending: Cardiology | Admitting: Cardiology

## 2022-10-14 DIAGNOSIS — I214 Non-ST elevation (NSTEMI) myocardial infarction: Secondary | ICD-10-CM | POA: Diagnosis not present

## 2022-10-16 ENCOUNTER — Encounter (HOSPITAL_COMMUNITY)
Admission: RE | Admit: 2022-10-16 | Discharge: 2022-10-16 | Disposition: A | Discharge status: Home / Self Care | Payer: Medicare Other | Source: Ambulatory Visit | Attending: Cardiology | Admitting: Cardiology

## 2022-10-16 DIAGNOSIS — I214 Non-ST elevation (NSTEMI) myocardial infarction: Secondary | ICD-10-CM | POA: Diagnosis not present

## 2022-10-19 ENCOUNTER — Encounter (HOSPITAL_COMMUNITY)
Admission: RE | Admit: 2022-10-19 | Discharge: 2022-10-19 | Disposition: A | Discharge status: Home / Self Care | Payer: Medicare Other | Source: Ambulatory Visit | Attending: Cardiology | Admitting: Cardiology

## 2022-10-19 ENCOUNTER — Other Ambulatory Visit: Payer: Self-pay

## 2022-10-19 ENCOUNTER — Other Ambulatory Visit (HOSPITAL_COMMUNITY): Payer: Self-pay

## 2022-10-19 DIAGNOSIS — I214 Non-ST elevation (NSTEMI) myocardial infarction: Secondary | ICD-10-CM

## 2022-10-21 ENCOUNTER — Encounter (HOSPITAL_COMMUNITY)
Admission: RE | Admit: 2022-10-21 | Discharge: 2022-10-21 | Disposition: A | Discharge status: Home / Self Care | Payer: Medicare Other | Source: Ambulatory Visit | Attending: Cardiology | Admitting: Cardiology

## 2022-10-21 VITALS — Ht 68.0 in | Wt 173.1 lb

## 2022-10-21 DIAGNOSIS — I214 Non-ST elevation (NSTEMI) myocardial infarction: Secondary | ICD-10-CM | POA: Diagnosis not present

## 2022-10-22 ENCOUNTER — Other Ambulatory Visit: Payer: Self-pay

## 2022-10-23 ENCOUNTER — Encounter (HOSPITAL_COMMUNITY)
Admission: RE | Admit: 2022-10-23 | Discharge: 2022-10-23 | Disposition: A | Discharge status: Home / Self Care | Payer: Medicare Other | Source: Ambulatory Visit | Attending: Cardiology | Admitting: Cardiology

## 2022-10-23 DIAGNOSIS — I214 Non-ST elevation (NSTEMI) myocardial infarction: Secondary | ICD-10-CM | POA: Diagnosis not present

## 2022-10-26 ENCOUNTER — Encounter (HOSPITAL_COMMUNITY): Payer: Medicare Other

## 2022-10-27 NOTE — Progress Notes (Signed)
Discharge Progress Report  Patient Details  Name: Salvator Muhl MRN: [id number] Date of Birth: [birth year] Referring Provider:   Flowsheet Row INTENSIVE CARDIAC REHAB ORIENT from 08/10/2022 in [facility]  Referring Provider Gypsy Balsam, MD        Number of Visits: 87  Reason for Discharge:  Patient reached a stable level of exercise. Patient independent in their exercise. Patient has met program and personal goals.  Smoking History:  Social History   Tobacco Use  Smoking Status Never  Smokeless Tobacco Never    Diagnosis:  NSTEMI (non-ST elevated myocardial infarction) [facility])  ADL UCSD:   Initial Exercise Prescription:  Initial Exercise Prescription - 08/10/22 1100       Date of Initial Exercise RX and Referring Provider   Date 08/10/22    Referring Provider Gypsy Balsam, MD    Expected Discharge Date 10/23/22      Bike   Level 2    Watts 60    Minutes 15    METs 2.5      Recumbant Elliptical   Level 2    RPM 70    Watts 90    Minutes 15    METs 2.5      Prescription Details   Frequency (times per week) 3    Duration Progress to 30 minutes of continuous aerobic without signs/symptoms of physical distress      Intensity   THRR 40-80% of Max Heartrate 59-118    Ratings of Perceived Exertion 11-13    Perceived Dyspnea 0-4      Progression   Progression Continue progressive overload as per policy without signs/symptoms or physical distress.      Resistance Training   Training Prescription Yes    Weight 3    Reps 10-15             Discharge Exercise Prescription (Final Exercise Prescription Changes):  Exercise Prescription Changes - 10/23/22 1628       Response to Exercise   Blood Pressure (Admit) 118/74    Blood Pressure (Exercise) 128/80    Blood Pressure (Exit) 115/75    Heart Rate (Admit) 55 bpm    Heart Rate (Exercise) 127 bpm    Heart Rate (Exit) 67 bpm    Rating  of Perceived Exertion (Exercise) 11    Perceived Dyspnea (Exercise) 0    Symptoms none    Comments Pr graduated the [employer] program    Duration Continue with 30 min of aerobic exercise without signs/symptoms of physical distress.    Intensity THRR unchanged      Progression   Progression Continue to progress workloads to maintain intensity without signs/symptoms of physical distress.    Average METs 5.7      Resistance Training   Training Prescription Yes    Weight 5 lbs    Reps 10-15    Time 10 Minutes      Interval Training   Interval Training No      Bike   Level 5    Watts 75    Minutes 15    METs 7.3      Recumbant Elliptical   Level 6    RPM 55    Watts 89    Minutes 15    METs 4.1      Home Exercise Plan   Plans to continue exercise at [employer] (comment)    Frequency Add 4 additional days to program exercise  sessions.    Initial Home Exercises Provided 09/09/22             Functional Capacity:  6 Minute Walk     Row Name 08/10/22 1156 10/22/22 0746       6 Minute Walk   Phase Initial Discharge    Distance 1266 feet 1823 feet    Distance % Change -- 44 %    Distance Feet Change -- 557 ft    Walk Time 6 minutes 6 minutes    # of Rest Breaks 0 0    MPH 2.79 3.45    METS 2.39 4.22    RPE 6 9    Perceived Dyspnea  0 0    VO2 Peak 9.77 14.77    Symptoms No No    Resting HR 80 bpm 56 bpm    Resting BP 108/78 132/66    Resting Oxygen Saturation  96 % --    Exercise Oxygen Saturation  during 6 min walk 98 % --    Max Ex. HR 101 bpm 121 bpm    Max Ex. BP 130/82 156/62    2 Minute Post BP 116/76 --             Psychological, QOL, Others - Outcomes: PHQ 2/9:    10/21/2022    3:39 PM 08/10/2022   11:56 AM 11/03/2021    2:25 PM 06/07/2018    3:31 PM  Depression screen PHQ 2/9  Decreased Interest 0 0 0 0  Down, Depressed, Hopeless 0 0 0 0  PHQ - 2 Score 0 0 0 0  Altered sleeping 0 0    Tired, decreased energy 0 2    Change  in appetite 0 0    Feeling bad or failure about yourself  0 0    Trouble concentrating 0 0    Moving slowly or fidgety/restless 0 0    Suicidal thoughts 0 0    PHQ-9 Score 0 2    Difficult doing work/chores Not difficult at all Not difficult at all      Quality of Life:  Quality of Life - 10/22/22 0749       Quality of Life   Select Quality of Life      Quality of Life Scores   Health/Function Post 29 %    Socioeconomic Post 27.86 %    Psych/Spiritual Post 29.14 %    Family Post 30 %    GLOBAL Post 28.94 %             Personal Goals: Goals established at orientation with interventions provided to work toward goal.  Personal Goals and Risk Factors at Admission - 08/10/22 1028       Core Components/Risk Factors/Patient Goals on Admission   Hypertension Yes    Intervention Provide education on lifestyle modifcations including regular physical activity/exercise, weight management, moderate sodium restriction and increased consumption of fresh fruit, vegetables, and low fat dairy, alcohol moderation, and smoking cessation.;Monitor prescription use compliance.    Expected Outcomes Long Term: Maintenance of blood pressure at goal levels.;Short Term: Continued assessment and intervention until BP is < 140/48mm HG in hypertensive participants. < 130/42mm HG in hypertensive participants with diabetes, heart failure or chronic kidney disease.    Lipids Yes    Intervention Provide education and support for participant on nutrition & aerobic/resistive exercise along with prescribed medications to achieve LDL 70mg , HDL >40mg .    Expected Outcomes Short Term: Participant states understanding of desired cholesterol  values and is compliant with medications prescribed. Participant is following exercise prescription and nutrition guidelines.;Long Term: Cholesterol controlled with medications as prescribed, with individualized exercise RX and with personalized nutrition plan. Value goals: LDL <  70mg , HDL > 40 mg.              Personal Goals Discharge:  Goals and Risk Factor Review     Row Name 08/20/22 0818 09/02/22 1742 09/30/22 1319         Core Components/Risk Factors/Patient Goals Review   Personal Goals Review Weight Management/Obesity;Hypertension;Lipids Weight Management/Obesity;Hypertension;Lipids Weight Management/Obesity;Hypertension;Lipids     Review Jakiah started intensive cardiac rehab on 08/19/22. Jonael did well with exercise, vital signs were stable Chevon is doing  well with exercise at intensive cardiac rehab, vital signs have been  stable Money continues to  well with exercise at intensive cardiac rehab, vital signs have been  stable. Ladarrian has lost 2.3 kg since starting the program     Expected Outcomes Jensyn will continue to participate in intensive cardiac rehab for exercise, nutrition and lifestyle modifications Jermal will continue to participate in intensive cardiac rehab for exercise, nutrition and lifestyle modifications Mats will continue to participate in intensive cardiac rehab for exercise, nutrition and lifestyle modifications              Exercise Goals and Review:  Exercise Goals     Row Name 08/10/22 1027             Exercise Goals   Increase Physical Activity Yes       Intervention Provide advice, education, support and counseling about physical activity/exercise needs.;Develop an individualized exercise prescription for aerobic and resistive training based on initial evaluation findings, risk stratification, comorbidities and participant's personal goals.       Expected Outcomes Short Term: Attend rehab on a regular basis to increase amount of physical activity.;Long Term: Exercising regularly at least 3-5 days a week.;Long Term: Add in home exercise to make exercise part of routine and to increase amount of physical activity.       Increase Strength and Stamina Yes       Intervention Provide advice, education,  support and counseling about physical activity/exercise needs.;Develop an individualized exercise prescription for aerobic and resistive training based on initial evaluation findings, risk stratification, comorbidities and participant's personal goals.       Expected Outcomes Short Term: Increase workloads from initial exercise prescription for resistance, speed, and METs.;Short Term: Perform resistance training exercises routinely during rehab and add in resistance training at home;Long Term: Improve cardiorespiratory fitness, muscular endurance and strength as measured by increased METs and functional capacity ( )       Able to understand and use rate of perceived exertion (RPE) scale Yes       Intervention Provide education and explanation on how to use RPE scale       Expected Outcomes Short Term: Able to use RPE daily in rehab to express subjective intensity level;Long Term:  Able to use RPE to guide intensity level when exercising independently       Knowledge and understanding of Target Heart Rate Range (THRR) Yes       Intervention Provide education and explanation of THRR including how the numbers were predicted and where they are located for reference       Expected Outcomes Short Term: Able to use daily as guideline for intensity in rehab;Long Term: Able to use THRR to govern intensity when exercising independently;Short Term: Able to state/look up  THRR       Understanding of Exercise Prescription Yes       Intervention Provide education, explanation, and written materials on patient's individual exercise prescription       Expected Outcomes Long Term: Able to explain home exercise prescription to exercise independently;Short Term: Able to explain program exercise prescription                Exercise Goals Re-Evaluation:  Exercise Goals Re-Evaluation     Row Name 08/19/22 1554 09/09/22 1642 10/07/22 1642 10/23/22 1630       Exercise Goal Re-Evaluation   Exercise Goals Review  Increase Physical Activity;Able to understand and use rate of perceived exertion (RPE) scale;Increase Strength and Stamina Increase Physical Activity;Able to understand and use rate of perceived exertion (RPE) scale;Increase Strength and Stamina Increase Physical Activity;Able to understand and use rate of perceived exertion (RPE) scale;Increase Strength and Stamina Increase Physical Activity;Able to understand and use rate of perceived exertion (RPE) scale;Increase Strength and Stamina;Understanding of Exercise Prescription;Knowledge and understanding of Target Heart Rate Range (THRR)    Comments Patient able to understand and use RPE scale appropriately. Reviewed MEt's, goals and home ExRx. Pt tolerated exercise well with an average MET level of 4.77. Pt will add in exercising on her own by biking 1-2 days for 30-45 mins per session. Pt feels good about his goals of returning to biking now and he now knows his limits to exercise. He also feels an increase in strength, stamina and has taken some new eating habits from our RD. Overall pt is feeling good and progressing well Reviewed MEt's, and goals. Pt tolerated exercise well with an average MET level of 6.0. Pt has met all of his goals, he has more diet info, exercise info, and is back to riding his bike. Pt states he is living a healthier lifestyle and is increasing strength and stamina Pt graduated the [employer] today. Pt tolerated exercise well with an average MET level of 5.7 and was joined by his daughter to celebrate. pt post test improved by 531ft for a total of 187ft. Pt did very well and he is glad to be back to riding his bike and living a healthier lifestyle. He will continue to exercise at home by walking, bike rides and his stationary bike 7 days for 30-60 mins per session.    Expected Outcomes Progress workloads as tolerated to help increase strength and stamina. Progress workloads as tolerated to help increase strength and  stamina. Progress workloads as tolerated to help increase strength and stamina. Pt will continue to exercise on his own and gain strength             Nutrition & Weight - Outcomes:  Pre Biometrics - 08/10/22 1024       Pre Biometrics   Waist Circumference 34.5 inches    Hip Circumference 39 inches    Waist to Hip Ratio 0.88 %    Triceps Skinfold 12 mm    % Body Fat 23.8 %    Grip Strength 15 kg    Flexibility 36 in    Single Leg Stand 4.68 seconds             Post Biometrics - 10/22/22 0747        Post  Biometrics   Height 5\' 8"  (1.727 m)    Weight 78.5 kg    Waist Circumference 36 inches    Hip Circumference 40 inches    Waist to Hip  Ratio 0.9 %    BMI (Calculated) 26.32    Triceps Skinfold 8 mm    % Body Fat 22.8 %    Grip Strength 34 kg    Flexibility 15 in    Single Leg Stand 11 seconds             Nutrition:  Nutrition Therapy & Goals - 10/16/22 1558       Nutrition Therapy   Diet Heart Healthy Diet    Drug/Food Interactions Statins/Certain Fruits      Personal Nutrition Goals   Nutrition Goal Patient to identify strategies for reducing cardiovascular risk by attending the [employer] education and nutrition series weekly.    Personal Goal #2 Patient to improve diet quality by using the plate method as a guide for meal planning to include lean protein/plant protein, fruits, vegetables, whole grains, nonfat dairy as part of a well-balanced diet.    Personal Goal #3 Patient to reduce sodium to 1500mg  per day    Comments Goals in action. Yameen continues to attend the [employer] and nutrition series regularly. He has started making many dietary changes including increased dietary fiber, decreased saturated fat, implemented omega 3 food sources, and reduced sodium intake. LDL remains >70; statin was increased at 09/04/22 follow-up appointment. His wife and daughters remain an excellent support. Patient will benefit from participation in intensive  cardiac rehab for nutrition, exercise, and lifestyle modification.      Intervention Plan   Intervention Prescribe, educate and counsel regarding individualized specific dietary modifications aiming towards targeted core components such as weight, hypertension, lipid management, diabetes, heart failure and other comorbidities.;Nutrition handout(s) given to patient.    Expected Outcomes Short Term Goal: Understand basic principles of dietary content, such as calories, fat, sodium, cholesterol and nutrients.;Long Term Goal: Adherence to prescribed nutrition plan.             Nutrition Discharge:  Nutrition Assessments - 10/21/22 1553       Rate Your Plate Scores   Pre Score 55    Post Score 84             Education Questionnaire Score:  Knowledge Questionnaire Score - 10/22/22 0749       Knowledge Questionnaire Score   Post Score 24/24             Goals reviewed with patient; copy given to patient.Pt graduates from  Intensive/Traditional cardiac rehab program 10/23/22  with completion of  58 exercise and education sessions. Pt maintained good attendance and progressed nicely during their participation in rehab as evidenced by increased MET level. Gaige increased his distance on his post exercise walk test by 557 feet and lost 1.7 kg.  Medication list reconciled. Repeat  PHQ score- 0 .  Pt has made significant lifestyle changes and should be commended for their success. Kwon achieved their goals during cardiac rehab.   Pt plans to continue exercise at home riding his stationary, outdoor bike and walking. We are proud of Marciano's progress.Thayer Headings RN BSN

## 2022-11-04 DIAGNOSIS — R55 Syncope and collapse: Secondary | ICD-10-CM | POA: Diagnosis not present

## 2022-11-04 DIAGNOSIS — I959 Hypotension, unspecified: Secondary | ICD-10-CM | POA: Diagnosis not present

## 2022-11-04 DIAGNOSIS — Z7902 Long term (current) use of antithrombotics/antiplatelets: Secondary | ICD-10-CM | POA: Diagnosis not present

## 2022-11-04 DIAGNOSIS — R61 Generalized hyperhidrosis: Secondary | ICD-10-CM | POA: Diagnosis not present

## 2022-11-04 DIAGNOSIS — R531 Weakness: Secondary | ICD-10-CM | POA: Diagnosis not present

## 2022-11-04 DIAGNOSIS — E860 Dehydration: Secondary | ICD-10-CM | POA: Diagnosis not present

## 2022-11-06 ENCOUNTER — Other Ambulatory Visit: Payer: Self-pay | Admitting: Medical

## 2022-11-21 ENCOUNTER — Other Ambulatory Visit: Payer: Self-pay | Admitting: Medical

## 2022-11-24 ENCOUNTER — Encounter: Payer: Self-pay | Admitting: Emergency Medicine

## 2022-11-24 DIAGNOSIS — H3322 Serous retinal detachment, left eye: Secondary | ICD-10-CM | POA: Insufficient documentation

## 2022-11-25 ENCOUNTER — Ambulatory Visit: Payer: Medicare Other | Admitting: Emergency Medicine

## 2022-11-25 VITALS — Ht 67.0 in | Wt 173.0 lb

## 2022-11-25 DIAGNOSIS — Z1159 Encounter for screening for other viral diseases: Secondary | ICD-10-CM

## 2022-11-25 DIAGNOSIS — Z0000 Encounter for general adult medical examination without abnormal findings: Secondary | ICD-10-CM

## 2022-11-25 NOTE — Progress Notes (Signed)
Subjective:   Bryan Craig is a 72 y.o. male who presents for Medicare Annual/Subsequent preventive examination.  Visit Complete: Virtual  I connected with  [name] on 11/25/22 by a audio enabled telemedicine application and verified that I am speaking with the correct person using two identifiers.  Patient Location: Home  Provider Location: Home Office  I discussed the limitations of evaluation and management by telemedicine. The patient expressed understanding and agreed to proceed.  Vital Signs: Unable to obtain new vitals due to this being a telehealth visit.   Review of Systems     Cardiac Risk Factors include: advanced age (>60men, >37 women);dyslipidemia;male gender;hypertension;Other (see comment), Risk factor comments: NSTEMI     Objective:    Today's Vitals   11/25/22 1032  Weight: 173 lb (78.5 kg)  Height: 5\' 7"  (1.702 m)   Body mass index is 27.1 kg/m.     11/25/2022   10:47 AM 07/21/2022    4:58 PM 02/13/2022   10:03 AM 03/03/2021    9:21 AM 04/06/2014    4:17 AM  Advanced Directives  Does Patient Have a Medical Advance Directive? No No No No No  Would patient like information on creating a medical advance directive? Yes (MAU/Ambulatory/Procedural Areas - Information given) No - Patient declined  No - Patient declined No - patient declined information    Current Medications (verified) Outpatient Encounter Medications as of 11/25/2022  Medication Sig   aspirin EC 81 MG tablet Take 1 tablet (81 mg total) by mouth daily. Swallow whole.   atorvastatin (LIPITOR) 80 MG tablet Take 1 tablet (80 mg total) by mouth at bedtime.   Difluprednate 0.05 % EMUL Administer 1 drop into the left eye daily.   ezetimibe (ZETIA) 10 MG tablet Take 1 tablet (10 mg total) by mouth daily.   metoprolol succinate (TOPROL-XL) 50 MG 24 hr tablet TAKE 1 TABLET BY MOUTH EVERY DAY   Risankizumab-rzaa (SKYRIZI) 150 MG/ML SOSY Inject into the skin once a week.   telmisartan  (MICARDIS) 80 MG tablet TAKE 1 TABLET BY MOUTH EVERY DAY   ticagrelor (BRILINTA) 90 MG TABS tablet Take 1 tablet (90 mg total) by mouth 2 (two) times daily.   triamcinolone cream (KENALOG) 0.1 % Apply 1 Application topically 2 (two) times daily. (Patient taking differently: Apply 1 Application topically 2 (two) times daily as needed (irritation).)   hydrochlorothiazide (HYDRODIURIL) 25 MG tablet Take 25 mg by mouth daily. (Patient not taking: Reported on 11/25/2022)   No facility-administered encounter medications on file as of 11/25/2022.    Allergies (verified) Lisinopril and Penicillins   History: Past Medical History:  Diagnosis Date   Hyperlipidemia    Hypertension    Kidney stone    Retinal detachment    Past Surgical History:  Procedure Laterality Date   CORONARY STENT INTERVENTION N/A 07/22/2022   Procedure: CORONARY STENT INTERVENTION;  Surgeon: Kathleene Hazel, MD;  Location: [facility];  Service: Cardiovascular;  Laterality: N/A;   EYE SURGERY     LEFT HEART CATH AND CORONARY ANGIOGRAPHY N/A 07/22/2022   Procedure: LEFT HEART CATH AND CORONARY ANGIOGRAPHY;  Surgeon: Kathleene Hazel, MD;  Location: [facility];  Service: Cardiovascular;  Laterality: N/A;   Family History  Problem Relation Age of Onset   Hyperlipidemia Mother    Hypertension Mother    Heart disease Mother    Heart attack Father 83   Social History   Socioeconomic History   Marital status: Married  Spouse name: Annice Pih   Number of children: 3   Years of education: Not on file   Highest education level: Not on file  Occupational History   Occupation: retired    Comment: Civil Service fast streamer  Tobacco Use   Smoking status: Never   Smokeless tobacco: Never  Vaping Use   Vaping status: Never Used  Substance and Sexual Activity   Alcohol use: Yes    Comment: 1 beer every 2-3 weeks   Drug use: Never   Sexual activity: Yes  Other Topics Concern   Not on file   Social History Narrative   Married, 2 daughters with current wife and 1 daughter from previous marriage   Social Determinants of Health   Financial Resource Strain: Low Risk  (11/25/2022)   Overall Financial Resource Strain (CARDIA)    Difficulty of Paying Living Expenses: Not very hard  Food Insecurity: No Food Insecurity (11/25/2022)   Hunger Vital Sign    Worried About Running Out of Food in the Last Year: Never true    Ran Out of Food in the Last Year: Never true  Transportation Needs: No Transportation Needs (11/25/2022)   PRAPARE - Administrator, Civil Service (Medical): No    Lack of Transportation (Non-Medical): No  Physical Activity: Sufficiently Active (11/25/2022)   Exercise Vital Sign    Days of Exercise per Week: 7 days    Minutes of Exercise per Session: 30 min  Stress: No Stress Concern Present (11/25/2022)   [employer] of Occupational Health - Occupational Stress Questionnaire    Feeling of Stress : Not at all  Social Connections: Moderately Isolated (11/25/2022)   Social Connection and Isolation Panel [NHANES]    Frequency of Communication with Friends and Family: More than three times a week    Frequency of Social Gatherings with Friends and Family: More than three times a week    Attends Religious Services: Never    Database administrator or Organizations: No    Attends Engineer, structural: Never    Marital Status: Married    Tobacco Counseling Counseling given: Not Answered   Clinical Intake:  Pre-visit preparation completed: Yes  Pain : No/denies pain     BMI - recorded: 27.1 Nutritional Status: BMI 25 -29 Overweight Nutritional Risks: None Diabetes: No  How often do you need to have someone help you when you read instructions, pamphlets, or other written materials from your doctor or pharmacy?: 1 - Never  Interpreter Needed?: No  Information entered by :: Tora Kindred, CMA   Activities of Daily Living     11/25/2022   10:33 AM 07/21/2022   11:00 PM  In your present state of health, do you have any difficulty performing the following activities:  Hearing? 0 0  Vision? 0 0  Difficulty concentrating or making decisions? 0 0  Walking or climbing stairs? 0 0  Dressing or bathing? 0 0  Doing errands, shopping? 0 0  Preparing Food and eating ? N   Using the Toilet? N   In the past six months, have you accidently leaked urine? N   Do you have problems with loss of bowel control? N   Managing your Medications? N   Managing your Finances? N   Housekeeping or managing your Housekeeping? N     Patient Care Team: Saguier, Kateri Mc as PCP - General (Internal Medicine) Runell Gess, MD as Consulting Physician (Cardiology)  Indicate any recent Medical Services you may  have received from other than [facility] providers in the past year (date may be approximate).     Assessment:   This is a routine wellness examination for Charmaine.  Hearing/Vision screen Hearing Screening - Comments:: Denies hearing loss  Dietary issues and exercise activities discussed:     Goals Addressed               This Visit's Progress     Patient Stated (pt-stated)        Maintain current health      Depression Screen    11/25/2022   10:44 AM 10/21/2022    3:39 PM 08/10/2022   11:56 AM 11/03/2021    2:25 PM 06/07/2018    3:31 PM  PHQ 2/9 Scores  PHQ - 2 Score 0 0 0 0 0  PHQ- 9 Score 0 0 2      Fall Risk    11/25/2022   10:47 AM 10/23/2022    2:08 PM 10/21/2022    2:10 PM 10/19/2022    3:11 PM 10/16/2022    3:31 PM  Fall Risk   Falls in the past year? 0 0 0 0 0  Number falls in past yr: 0 0 0 0 0  Injury with Fall? 0 0 0 0 0  Risk for fall due to : No Fall Risks No Fall Risks No Fall Risks No Fall Risks No Fall Risks  Follow up Falls prevention discussed Falls evaluation completed Falls evaluation completed Falls evaluation completed Falls evaluation completed    MEDICARE RISK AT HOME:   Medicare Risk at Home - 11/25/22 1047     Any stairs in or around the home? Yes    If so, are there any without handrails? No    Home free of loose throw rugs in walkways, pet beds, electrical cords, etc? Yes    Adequate lighting in your home to reduce risk of falls? Yes    Life alert? No    Use of a cane, walker or w/c? No    Grab bars in the bathroom? Yes    Shower chair or bench in shower? Yes    Elevated toilet seat or a handicapped toilet? Yes             TIMED UP AND GO:  Was the test performed?  No    Cognitive Function:        11/25/2022   10:49 AM  6CIT Screen  What Year? 0 points  What month? 0 points  What time? 0 points  Count back from 20 0 points  Months in reverse 0 points  Repeat phrase 0 points  Total Score 0 points    Immunizations Immunization History  Administered Date(s) Administered   PFIZER(Purple Top)SARS-COV-2 Vaccination 08/04/2019, 08/28/2019   Pneumococcal Conjugate-13 02/12/2016   Tdap 12/05/2012, 11/18/2021    TDAP status: Up to date  Flu Vaccine status: Declined, Education has been provided regarding the importance of this vaccine but patient still declined. Advised may receive this vaccine at local pharmacy or Health Dept. Aware to provide a copy of the vaccination record if obtained from local pharmacy or Health Dept. Verbalized acceptance and understanding.  Pneumococcal vaccine status: Declined,  Education has been provided regarding the importance of this vaccine but patient still declined. Advised may receive this vaccine at local pharmacy or Health Dept. Aware to provide a copy of the vaccination record if obtained from local pharmacy or Health Dept. Verbalized acceptance and understanding.   Covid-19 vaccine  status: Declined, Education has been provided regarding the importance of this vaccine but patient still declined. Advised may receive this vaccine at local pharmacy or Health Dept.or vaccine clinic. Aware to provide a copy  of the vaccination record if obtained from local pharmacy or Health Dept. Verbalized acceptance and understanding.  Qualifies for Shingles Vaccine? Yes   Zostavax completed No   Shingrix Completed?: No.    Education has been provided regarding the importance of this vaccine. Patient has been advised to call insurance company to determine out of pocket expense if they have not yet received this vaccine. Advised may also receive vaccine at local pharmacy or Health Dept. Verbalized acceptance and understanding.  Screening Tests Health Maintenance  Topic Date Due   Hepatitis C Screening  Never done   Colonoscopy  Never done   Zoster Vaccines- Shingrix (1 of 2) Never done   Pneumonia Vaccine 87+ Years old (2 of 2 - PPSV23 or PCV20) 02/11/2017   COVID-19 Vaccine (3 - 2023-24 season) 12/26/2021   INFLUENZA VACCINE  11/26/2022   Medicare Annual Wellness (AWV)  11/25/2023   DTaP/Tdap/Td (3 - Td or Tdap) 11/19/2031   HPV VACCINES  Aged Out    Health Maintenance  Health Maintenance Due  Topic Date Due   Hepatitis C Screening  Never done   Colonoscopy  Never done   Zoster Vaccines- Shingrix (1 of 2) Never done   Pneumonia Vaccine 87+ Years old (2 of 2 - PPSV23 or PCV20) 02/11/2017   COVID-19 Vaccine (3 - 2023-24 season) 12/26/2021    Colorectal cancer screening: Referral to GI placed patient declined. Pt aware the office will call re: appt.  Lung Cancer Screening: (Low Dose CT Chest recommended if Age 74-80 years, 20 pack-year currently smoking OR have quit w/in 15years.) does not qualify.   Lung Cancer Screening Referral: n/a  Additional Screening:  Hepatitis C Screening: does qualify; Completed will get with next labs, order placed.  Vision Screening: Recommended annual ophthalmology exams for early detection of glaucoma and other disorders of the eye.  Dental Screening: Recommended annual dental exams for proper oral hygiene    Community Resource Referral / Chronic Care  Management: CRR required this visit?  No   CCM required this visit?  No     Plan:     I have personally reviewed and noted the following in the patient's chart:   Medical and social history Use of alcohol, tobacco or illicit drugs  Current medications and supplements including opioid prescriptions. Patient is not currently taking opioid prescriptions. Functional ability and status Nutritional status Physical activity Advanced directives List of other physicians Hospitalizations, surgeries, and ER visits in previous 12 months Vitals Screenings to include cognitive, depression, and falls Referrals and appointments  In addition, I have reviewed and discussed with patient certain preventive protocols, quality metrics, and best practice recommendations. A written personalized care plan for preventive services as well as general preventive health recommendations were provided to patient.     Tora Kindred, CMA   11/25/2022   After Visit Summary: (MyChart) Due to this being a telephonic visit, the after visit summary with patients personalized plan was offered to patient via MyChart   Nurse Notes:  Patient refuses colonoscopy, Pneumonia, Covid and Shingrix vaccines. Education given. Patient agreed to Hep C Screening with next labs. Order placed.

## 2022-11-25 NOTE — Patient Instructions (Addendum)
Bryan Craig , Thank you for taking time to come for your Medicare Wellness Visit. I appreciate your ongoing commitment to your health goals. Please review the following plan we discussed and let me know if I can assist you in the future.   Referrals/Orders/Follow-Ups/Clinician Recommendations: Recommend GI referral for colonoscopy, Pneumonia and Shingles vaccine.  This is a list of the screening recommended for you and due dates:  Health Maintenance  Topic Date Due   Hepatitis C Screening  Never done   Colon Cancer Screening  Never done   Zoster (Shingles) Vaccine (1 of 2) Never done   Pneumonia Vaccine (2 of 2 - PPSV23 or PCV20) 02/11/2017   COVID-19 Vaccine (3 - 2023-24 season) 12/26/2021   Flu Shot  11/26/2022   Medicare Annual Wellness Visit  11/25/2023   DTaP/Tdap/Td vaccine (3 - Td or Tdap) 11/19/2031   HPV Vaccine  Aged Out    Advanced directives: Information on Advanced Care Planning can be found at [facility] Advance Health Care Directives Advance Health Care Directives ([email or web address]    Next Medicare Annual Wellness Visit scheduled for next year: Yes, 11/30/23 @ 10:20 am  Preventive Care 65 Years and Older, Male  Preventive care refers to lifestyle choices and visits with your health care provider that can promote health and wellness. What does preventive care include? A yearly physical exam. This is also called an annual well check. Dental exams once or twice a year. Routine eye exams. Ask your health care provider how often you should have your eyes checked. Personal lifestyle choices, including: Daily care of your teeth and gums. Regular physical activity. Eating a healthy diet. Avoiding tobacco and drug use. Limiting alcohol use. Practicing safe sex. Taking low doses of aspirin every day. Taking vitamin and mineral supplements as recommended by your health care provider. What happens during an annual well check? The services and screenings done by  your health care provider during your annual well check will depend on your age, overall health, lifestyle risk factors, and family history of disease. Counseling  Your health care provider may ask you questions about your: Alcohol use. Tobacco use. Drug use. Emotional well-being. Home and relationship well-being. Sexual activity. Eating habits. History of falls. Memory and ability to understand (cognition). Work and work Astronomer. Screening  You may have the following tests or measurements: Height, weight, and BMI. Blood pressure. Lipid and cholesterol levels. These may be checked every 5 years, or more frequently if you are over 15 years old. Skin check. Lung cancer screening. You may have this screening every year starting at age 43 if you have a 30-pack-year history of smoking and currently smoke or have quit within the past 15 years. Fecal occult blood test (FOBT) of the stool. You may have this test every year starting at age 76. Flexible sigmoidoscopy or colonoscopy. You may have a sigmoidoscopy every 5 years or a colonoscopy every 10 years starting at age 72. Prostate cancer screening. Recommendations will vary depending on your family history and other risks. Hepatitis C blood test. Hepatitis B blood test. Sexually transmitted disease (STD) testing. Diabetes screening. This is done by checking your blood sugar (glucose) after you have not eaten for a while (fasting). You may have this done every 1-3 years. Abdominal aortic aneurysm (AAA) screening. You may need this if you are a current or former smoker. Osteoporosis. You may be screened starting at age 87 if you are at high risk. Talk with your health care  provider about your test results, treatment options, and if necessary, the need for more tests. Vaccines  Your health care provider may recommend certain vaccines, such as: Influenza vaccine. This is recommended every year. Tetanus, diphtheria, and acellular pertussis  (Tdap, Td) vaccine. You may need a Td booster every 10 years. Zoster vaccine. You may need this after age 64. Pneumococcal 13-valent conjugate (PCV13) vaccine. One dose is recommended after age 7. Pneumococcal polysaccharide (PPSV23) vaccine. One dose is recommended after age 23. Talk to your health care provider about which screenings and vaccines you need and how often you need them. This information is not intended to replace advice given to you by your health care provider. Make sure you discuss any questions you have with your health care provider. Document Released: 05/10/2015 Document Revised: 01/01/2016 Document Reviewed: 02/12/2015 Elsevier Interactive Patient Education  2017 [employer].  Fall Prevention in the Home Falls can cause injuries. They can happen to people of all ages. There are many things you can do to make your home safe and to help prevent falls. What can I do on the outside of my home? Regularly fix the edges of walkways and driveways and fix any cracks. Remove anything that might make you trip as you walk through a door, such as a raised step or threshold. Trim any bushes or trees on the path to your home. Use bright outdoor lighting. Clear any walking paths of anything that might make someone trip, such as rocks or tools. Regularly check to see if handrails are loose or broken. Make sure that both sides of any steps have handrails. Any raised decks and porches should have guardrails on the edges. Have any leaves, snow, or ice cleared regularly. Use sand or salt on walking paths during winter. Clean up any spills in your garage right away. This includes oil or grease spills. What can I do in the bathroom? Use night lights. Install grab bars by the toilet and in the tub and shower. Do not use towel bars as grab bars. Use non-skid mats or decals in the tub or shower. If you need to sit down in the shower, use a plastic, non-slip stool. Keep the floor dry. Clean  up any water that spills on the floor as soon as it happens. Remove soap buildup in the tub or shower regularly. Attach bath mats securely with double-sided non-slip rug tape. Do not have throw rugs and other things on the floor that can make you trip. What can I do in the bedroom? Use night lights. Make sure that you have a light by your bed that is easy to reach. Do not use any sheets or blankets that are too big for your bed. They should not hang down onto the floor. Have a firm chair that has side arms. You can use this for support while you get dressed. Do not have throw rugs and other things on the floor that can make you trip. What can I do in the kitchen? Clean up any spills right away. Avoid walking on wet floors. Keep items that you use a lot in easy-to-reach places. If you need to reach something above you, use a strong step stool that has a grab bar. Keep electrical cords out of the way. Do not use floor polish or wax that makes floors slippery. If you must use wax, use non-skid floor wax. Do not have throw rugs and other things on the floor that can make you trip. What can I  do with my stairs? Do not leave any items on the stairs. Make sure that there are handrails on both sides of the stairs and use them. Fix handrails that are broken or loose. Make sure that handrails are as long as the stairways. Check any carpeting to make sure that it is firmly attached to the stairs. Fix any carpet that is loose or worn. Avoid having throw rugs at the top or bottom of the stairs. If you do have throw rugs, attach them to the floor with carpet tape. Make sure that you have a light switch at the top of the stairs and the bottom of the stairs. If you do not have them, ask someone to add them for you. What else can I do to help prevent falls? Wear shoes that: Do not have high heels. Have rubber bottoms. Are comfortable and fit you well. Are closed at the toe. Do not wear sandals. If you  use a stepladder: Make sure that it is fully opened. Do not climb a closed stepladder. Make sure that both sides of the stepladder are locked into place. Ask someone to hold it for you, if possible. Clearly mark and make sure that you can see: Any grab bars or handrails. First and last steps. Where the edge of each step is. Use tools that help you move around (mobility aids) if they are needed. These include: Canes. Walkers. Scooters. Crutches. Turn on the lights when you go into a dark area. Replace any light bulbs as soon as they burn out. Set up your furniture so you have a clear path. Avoid moving your furniture around. If any of your floors are uneven, fix them. If there are any pets around you, be aware of where they are. Review your medicines with your doctor. Some medicines can make you feel dizzy. This can increase your chance of falling. Ask your doctor what other things that you can do to help prevent falls. This information is not intended to replace advice given to you by your health care provider. Make sure you discuss any questions you have with your health care provider. Document Released: 02/07/2009 Document Revised: 09/19/2015 Document Reviewed: 05/18/2014 Elsevier Interactive Patient Education  2017 [employer].

## 2022-11-30 ENCOUNTER — Ambulatory Visit (INDEPENDENT_AMBULATORY_CARE_PROVIDER_SITE_OTHER): Payer: Medicare Other | Admitting: Medical

## 2022-11-30 VITALS — BP 126/70 | HR 58 | Temp 98.2°F | Resp 18 | Ht 67.0 in | Wt 175.0 lb

## 2022-11-30 DIAGNOSIS — E785 Hyperlipidemia, unspecified: Secondary | ICD-10-CM | POA: Diagnosis not present

## 2022-11-30 DIAGNOSIS — R748 Abnormal levels of other serum enzymes: Secondary | ICD-10-CM

## 2022-11-30 DIAGNOSIS — R739 Hyperglycemia, unspecified: Secondary | ICD-10-CM

## 2022-11-30 DIAGNOSIS — I10 Essential (primary) hypertension: Secondary | ICD-10-CM | POA: Diagnosis not present
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
## 2022-11-30 NOTE — Patient Instructions (Addendum)
1. Hyperlipidemia, unspecified hyperlipidemia type- CONTINUE ASPIRIN, BRILINTA, ZETIA AND ATRORVASTATIN PER CARDIOLOGIST. - Comp Met (CMET); Future  2. Hypertension, unspecified type BP CONTROLLED. CONTINUE TELMISARTAN AND TOPROL XL. - Comp Met (CMET); Future  3. Elevated liver enzymes Future cmp but get appt in one week. No alcohol between now and lab.  4. Elevated blood sugar Low sugar diet and check 3 month average. - Hemoglobin A1c; Future   Follow up date likely 3 months but sooner if labs abnormal.

## 2022-12-07 ENCOUNTER — Other Ambulatory Visit (INDEPENDENT_AMBULATORY_CARE_PROVIDER_SITE_OTHER): Payer: Medicare Other

## 2022-12-07 DIAGNOSIS — Z1159 Encounter for screening for other viral diseases: Secondary | ICD-10-CM | POA: Diagnosis not present

## 2022-12-07 DIAGNOSIS — R739 Hyperglycemia, unspecified: Secondary | ICD-10-CM | POA: Diagnosis not present

## 2022-12-07 DIAGNOSIS — E785 Hyperlipidemia, unspecified: Secondary | ICD-10-CM

## 2022-12-07 DIAGNOSIS — I10 Essential (primary) hypertension: Secondary | ICD-10-CM

## 2022-12-07 LAB — HEMOGLOBIN A1C: Hgb A1c MFr Bld: 5.9 % (ref 4.6–6.5)

## 2022-12-07 LAB — COMPREHENSIVE METABOLIC PANEL
ALT: 62 U/L — ABNORMAL HIGH (ref 0–53)
AST: 47 U/L — ABNORMAL HIGH (ref 0–37)
Albumin: 4.1 g/dL (ref 3.5–5.2)
Alkaline Phosphatase: 127 U/L — ABNORMAL HIGH (ref 39–117)
BUN: 13 mg/dL (ref 6–23)
CO2: 29 mEq/L (ref 19–32)
Calcium: 9.4 mg/dL (ref 8.4–10.5)
Chloride: 103 mEq/L (ref 96–112)
Creatinine, Ser: 0.98 mg/dL (ref 0.40–1.50)
GFR: 76.61 mL/min (ref >60.00)
Glucose, Bld: 98 mg/dL (ref 70–99)
Potassium: 4.3 mEq/L (ref 3.5–5.1)
Sodium: 137 mEq/L (ref 135–145)
Total Bilirubin: 0.7 mg/dL (ref 0.2–1.2)
Total Protein: 6.6 g/dL (ref 6.0–8.3)

## 2022-12-07 NOTE — Addendum Note (Signed)
Addended by: Mervin Kung A on: 12/07/2022 08:57 AM   Modules accepted: Orders

## 2022-12-08 NOTE — Addendum Note (Signed)
Addended by: Gwenevere Abbot on: 12/08/2022 05:42 PM   Modules accepted: Orders

## 2022-12-09 ENCOUNTER — Telehealth (HOSPITAL_BASED_OUTPATIENT_CLINIC_OR_DEPARTMENT_OTHER): Payer: Self-pay

## 2022-12-15 ENCOUNTER — Telehealth (HOSPITAL_BASED_OUTPATIENT_CLINIC_OR_DEPARTMENT_OTHER): Payer: Self-pay

## 2022-12-17 DIAGNOSIS — Z79899 Other long term (current) drug therapy: Secondary | ICD-10-CM | POA: Diagnosis not present

## 2022-12-17 DIAGNOSIS — L408 Other psoriasis: Secondary | ICD-10-CM | POA: Diagnosis not present

## 2023-01-15 ENCOUNTER — Other Ambulatory Visit (HOSPITAL_COMMUNITY): Payer: Self-pay

## 2023-01-15 ENCOUNTER — Other Ambulatory Visit (HOSPITAL_COMMUNITY): Payer: Self-pay | Admitting: Student

## 2023-01-15 MED ORDER — ATORVASTATIN CALCIUM 80 MG PO TABS
80.0000 mg | ORAL_TABLET | Freq: Every day | ORAL | 1 refills | Status: DC
  Filled 2023-01-15: qty 90, 90d supply, fill #0
  Filled 2023-04-14 – 2023-04-15 (×2): qty 90, 90d supply, fill #1

## 2023-01-15 NOTE — Telephone Encounter (Signed)
Rx refill sent to pharmacy. 

## 2023-01-18 ENCOUNTER — Other Ambulatory Visit: Payer: Self-pay

## 2023-01-24 ENCOUNTER — Inpatient Hospital Stay (HOSPITAL_BASED_OUTPATIENT_CLINIC_OR_DEPARTMENT_OTHER)
Admission: EM | Admit: 2023-01-24 | Discharge: 2023-01-26 | DRG: 321 | Disposition: A | Discharge status: Home / Self Care | Payer: Medicare Other | Attending: Cardiovascular Disease | Admitting: Cardiovascular Disease

## 2023-01-24 ENCOUNTER — Inpatient Hospital Stay (HOSPITAL_COMMUNITY): Payer: Medicare Other

## 2023-01-24 ENCOUNTER — Encounter (HOSPITAL_BASED_OUTPATIENT_CLINIC_OR_DEPARTMENT_OTHER): Payer: Self-pay | Admitting: Urology

## 2023-01-24 ENCOUNTER — Encounter (HOSPITAL_COMMUNITY)
Admission: EM | Disposition: A | Discharge status: Home / Self Care | Payer: Self-pay | Source: Home / Self Care | Attending: Cardiovascular Disease

## 2023-01-24 ENCOUNTER — Emergency Department (HOSPITAL_BASED_OUTPATIENT_CLINIC_OR_DEPARTMENT_OTHER): Payer: Medicare Other

## 2023-01-24 ENCOUNTER — Other Ambulatory Visit: Payer: Self-pay

## 2023-01-24 DIAGNOSIS — E785 Hyperlipidemia, unspecified: Secondary | ICD-10-CM | POA: Diagnosis not present

## 2023-01-24 DIAGNOSIS — I252 Old myocardial infarction: Secondary | ICD-10-CM | POA: Diagnosis not present

## 2023-01-24 DIAGNOSIS — R7303 Prediabetes: Secondary | ICD-10-CM | POA: Diagnosis present

## 2023-01-24 DIAGNOSIS — Z87442 Personal history of urinary calculi: Secondary | ICD-10-CM

## 2023-01-24 DIAGNOSIS — Z955 Presence of coronary angioplasty implant and graft: Secondary | ICD-10-CM | POA: Diagnosis not present

## 2023-01-24 DIAGNOSIS — Z8249 Family history of ischemic heart disease and other diseases of the circulatory system: Secondary | ICD-10-CM | POA: Diagnosis not present

## 2023-01-24 DIAGNOSIS — Y9355 Activity, bike riding: Secondary | ICD-10-CM | POA: Diagnosis not present

## 2023-01-24 DIAGNOSIS — I110 Hypertensive heart disease with heart failure: Secondary | ICD-10-CM | POA: Diagnosis not present

## 2023-01-24 DIAGNOSIS — Z880 Allergy status to penicillin: Secondary | ICD-10-CM | POA: Diagnosis not present

## 2023-01-24 DIAGNOSIS — Z79899 Other long term (current) drug therapy: Secondary | ICD-10-CM | POA: Diagnosis not present

## 2023-01-24 DIAGNOSIS — Z888 Allergy status to other drugs, medicaments and biological substances status: Secondary | ICD-10-CM | POA: Diagnosis not present

## 2023-01-24 DIAGNOSIS — I213 ST elevation (STEMI) myocardial infarction of unspecified site: Secondary | ICD-10-CM | POA: Diagnosis not present

## 2023-01-24 DIAGNOSIS — Z7982 Long term (current) use of aspirin: Secondary | ICD-10-CM

## 2023-01-24 DIAGNOSIS — Z7902 Long term (current) use of antithrombotics/antiplatelets: Secondary | ICD-10-CM

## 2023-01-24 DIAGNOSIS — I5021 Acute systolic (congestive) heart failure: Secondary | ICD-10-CM | POA: Diagnosis not present

## 2023-01-24 DIAGNOSIS — Z9889 Other specified postprocedural states: Secondary | ICD-10-CM

## 2023-01-24 DIAGNOSIS — I2510 Atherosclerotic heart disease of native coronary artery without angina pectoris: Secondary | ICD-10-CM | POA: Diagnosis not present

## 2023-01-24 DIAGNOSIS — I10 Essential (primary) hypertension: Secondary | ICD-10-CM | POA: Diagnosis present

## 2023-01-24 DIAGNOSIS — I2129 ST elevation (STEMI) myocardial infarction involving other sites: Secondary | ICD-10-CM | POA: Diagnosis not present

## 2023-01-24 HISTORY — DX: Atherosclerotic heart disease of native coronary artery without angina pectoris: I25.10

## 2023-01-24 HISTORY — PX: CORONARY/GRAFT ACUTE MI REVASCULARIZATION: CATH118305

## 2023-01-24 HISTORY — PX: LEFT HEART CATH AND CORONARY ANGIOGRAPHY: CATH118249

## 2023-01-24 LAB — LIPID PANEL
Cholesterol: 124 mg/dL (ref 0–200)
HDL: 37 mg/dL — ABNORMAL LOW (ref >40)
LDL Cholesterol: 71 mg/dL (ref 0–99)
Total CHOL/HDL Ratio: 3.4 {ratio}
Triglycerides: 79 mg/dL (ref <150)
VLDL: 16 mg/dL (ref 0–40)

## 2023-01-24 LAB — BASIC METABOLIC PANEL
Anion gap: 12 (ref 5–15)
BUN: 14 mg/dL (ref 8–23)
CO2: 23 mmol/L (ref 22–32)
Calcium: 9.6 mg/dL (ref 8.9–10.3)
Chloride: 106 mmol/L (ref 98–111)
Creatinine, Ser: 1.22 mg/dL (ref 0.61–1.24)
GFR, Estimated: >60 mL/min (ref >60)
Glucose, Bld: 120 mg/dL — ABNORMAL HIGH (ref 70–99)
Potassium: 4.2 mmol/L (ref 3.5–5.1)
Sodium: 141 mmol/L (ref 135–145)

## 2023-01-24 LAB — APTT: aPTT: 24 s (ref 24–36)

## 2023-01-24 LAB — PROTIME-INR
INR: 1 (ref 0.8–1.2)
Prothrombin Time: 13.3 s (ref 11.4–15.2)

## 2023-01-24 LAB — HEMOGLOBIN A1C
Hgb A1c MFr Bld: 6.1 % — ABNORMAL HIGH (ref 4.8–5.6)
Mean Plasma Glucose: 128.37 mg/dL

## 2023-01-24 LAB — MRSA NEXT GEN BY PCR, NASAL: MRSA by PCR Next Gen: NOT DETECTED

## 2023-01-24 LAB — CBC
HCT: 37.5 % — ABNORMAL LOW (ref 39.0–52.0)
Hemoglobin: 12.2 g/dL — ABNORMAL LOW (ref 13.0–17.0)
MCH: 28.8 pg (ref 26.0–34.0)
MCHC: 32.5 g/dL (ref 30.0–36.0)
MCV: 88.4 fL (ref 80.0–100.0)
Platelets: 316 10*3/uL (ref 150–400)
RBC: 4.24 MIL/uL (ref 4.22–5.81)
RDW: 13.2 % (ref 11.5–15.5)
WBC: 9.4 10*3/uL (ref 4.0–10.5)
nRBC: 0 % (ref 0.0–0.2)

## 2023-01-24 LAB — CG4 I-STAT (LACTIC ACID): Lactic Acid, Venous: 1.8 mmol/L (ref 0.5–1.9)

## 2023-01-24 LAB — POCT ACTIVATED CLOTTING TIME: Activated Clotting Time: 305 s

## 2023-01-24 LAB — TROPONIN I (HIGH SENSITIVITY)
Troponin I (High Sensitivity): 9 ng/L (ref <18)
Troponin I (High Sensitivity): >24000 ng/L (ref <18)

## 2023-01-24 SURGERY — CORONARY/GRAFT ACUTE MI REVASCULARIZATION
Anesthesia: LOCAL

## 2023-01-24 MED ORDER — PRASUGREL HCL 10 MG PO TABS
ORAL_TABLET | ORAL | Status: AC
  Filled 2023-01-24: qty 6

## 2023-01-24 MED ORDER — SODIUM CHLORIDE 0.9% FLUSH: 3.0000 mL | INTRAVENOUS | Status: DC | PRN

## 2023-01-24 MED ORDER — CHLORHEXIDINE GLUCONATE CLOTH 2 % EX PADS
6.0000 | MEDICATED_PAD | Freq: Every day | CUTANEOUS | Status: DC
  Administered 2023-01-24 – 2023-01-25 (×2): 6 via TOPICAL

## 2023-01-24 MED ORDER — HEPARIN SODIUM (PORCINE) 1000 UNIT/ML IJ SOLN
INTRAMUSCULAR | Status: AC
  Filled 2023-01-24: qty 10

## 2023-01-24 MED ORDER — HYDRALAZINE HCL 20 MG/ML IJ SOLN: 10.0000 mg | INTRAMUSCULAR | Status: AC | PRN

## 2023-01-24 MED ORDER — SODIUM CHLORIDE 0.9 % IV SOLN: INTRAVENOUS | Status: AC

## 2023-01-24 MED ORDER — SODIUM CHLORIDE 0.9 % IV SOLN: 250.0000 mL | INTRAVENOUS | Status: DC | PRN

## 2023-01-24 MED ORDER — PRASUGREL HCL 10 MG PO TABS
ORAL_TABLET | ORAL | Status: DC | PRN
  Administered 2023-01-24: 60 mg via ORAL

## 2023-01-24 MED ORDER — IRBESARTAN 75 MG PO TABS
75.0000 mg | ORAL_TABLET | Freq: Every day | ORAL | Status: DC
  Administered 2023-01-25 – 2023-01-26 (×2): 75 mg via ORAL
  Filled 2023-01-24 (×2): qty 1

## 2023-01-24 MED ORDER — HEPARIN SODIUM (PORCINE) 1000 UNIT/ML IJ SOLN
INTRAMUSCULAR | Status: DC | PRN
  Administered 2023-01-24: 10000 [IU] via INTRAVENOUS

## 2023-01-24 MED ORDER — HEPARIN (PORCINE) 25000 UT/250ML-% IV SOLN
INTRAVENOUS | Status: AC
  Filled 2023-01-24: qty 250

## 2023-01-24 MED ORDER — METOPROLOL SUCCINATE ER 50 MG PO TB24
50.0000 mg | ORAL_TABLET | Freq: Every day | ORAL | Status: DC
  Administered 2023-01-25 – 2023-01-26 (×2): 50 mg via ORAL
  Filled 2023-01-24: qty 2
  Filled 2023-01-24: qty 1

## 2023-01-24 MED ORDER — ASPIRIN 81 MG PO CHEW
324.0000 mg | CHEWABLE_TABLET | Freq: Once | ORAL | Status: AC
  Administered 2023-01-24: 324 mg via ORAL
  Filled 2023-01-24: qty 4

## 2023-01-24 MED ORDER — EZETIMIBE 10 MG PO TABS
10.0000 mg | ORAL_TABLET | Freq: Every day | ORAL | Status: DC
  Administered 2023-01-25 – 2023-01-26 (×2): 10 mg via ORAL
  Filled 2023-01-24 (×2): qty 1

## 2023-01-24 MED ORDER — PRASUGREL HCL 10 MG PO TABS
10.0000 mg | ORAL_TABLET | Freq: Every day | ORAL | Status: DC
  Administered 2023-01-25 – 2023-01-26 (×2): 10 mg via ORAL
  Filled 2023-01-24 (×2): qty 1

## 2023-01-24 MED ORDER — LABETALOL HCL 5 MG/ML IV SOLN: 10.0000 mg | INTRAVENOUS | Status: AC | PRN

## 2023-01-24 MED ORDER — SODIUM CHLORIDE 0.9 % IV SOLN: INTRAVENOUS | Status: DC

## 2023-01-24 MED ORDER — ONDANSETRON HCL 4 MG/2ML IJ SOLN: 4.0000 mg | Freq: Four times a day (QID) | INTRAMUSCULAR | Status: DC | PRN

## 2023-01-24 MED ORDER — TIROFIBAN HCL IN NACL 5-0.9 MG/100ML-% IV SOLN
INTRAVENOUS | Status: DC | PRN
  Administered 2023-01-24: .075 ug/kg/min via INTRAVENOUS

## 2023-01-24 MED ORDER — VERAPAMIL HCL 2.5 MG/ML IV SOLN
INTRAVENOUS | Status: AC
  Filled 2023-01-24: qty 2

## 2023-01-24 MED ORDER — ATORVASTATIN CALCIUM 80 MG PO TABS
80.0000 mg | ORAL_TABLET | Freq: Every day | ORAL | Status: DC
  Administered 2023-01-25: 80 mg via ORAL
  Filled 2023-01-24: qty 1

## 2023-01-24 MED ORDER — HEPARIN SODIUM (PORCINE) 5000 UNIT/ML IJ SOLN
4000.0000 [IU] | Freq: Once | INTRAMUSCULAR | Status: AC
  Administered 2023-01-24: 4000 [IU] via INTRAVENOUS
  Filled 2023-01-24: qty 1

## 2023-01-24 MED ORDER — VERAPAMIL HCL 2.5 MG/ML IV SOLN
INTRAVENOUS | Status: DC | PRN
  Administered 2023-01-24: 10 mL via INTRA_ARTERIAL

## 2023-01-24 MED ORDER — ASPIRIN 81 MG PO TBEC
81.0000 mg | DELAYED_RELEASE_TABLET | Freq: Every day | ORAL | Status: DC
  Administered 2023-01-25 – 2023-01-26 (×2): 81 mg via ORAL
  Filled 2023-01-24 (×2): qty 1

## 2023-01-24 MED ORDER — TIROFIBAN (AGGRASTAT) BOLUS VIA INFUSION
INTRAVENOUS | Status: DC | PRN
  Administered 2023-01-24: 1985 ug via INTRAVENOUS

## 2023-01-24 MED ORDER — LIDOCAINE HCL (PF) 1 % IJ SOLN
INTRAMUSCULAR | Status: AC
  Filled 2023-01-24: qty 30

## 2023-01-24 MED ORDER — SODIUM CHLORIDE 0.9% FLUSH
3.0000 mL | Freq: Two times a day (BID) | INTRAVENOUS | Status: DC
  Administered 2023-01-24 – 2023-01-25 (×3): 3 mL via INTRAVENOUS

## 2023-01-24 MED ORDER — ORAL CARE MOUTH RINSE: 15.0000 mL | OROMUCOSAL | Status: DC | PRN

## 2023-01-24 MED ORDER — ACETAMINOPHEN 325 MG PO TABS: 650.0000 mg | ORAL_TABLET | ORAL | Status: DC | PRN

## 2023-01-24 MED ORDER — IOHEXOL 350 MG/ML SOLN
INTRAVENOUS | Status: DC | PRN
  Administered 2023-01-24: 100 mL via INTRA_ARTERIAL

## 2023-01-24 MED ORDER — NITROGLYCERIN IN D5W 200-5 MCG/ML-% IV SOLN: 0.0000 ug/min | INTRAVENOUS | Status: DC

## 2023-01-24 MED ORDER — TIROFIBAN HCL IN NACL 5-0.9 MG/100ML-% IV SOLN
0.0750 ug/kg/min | INTRAVENOUS | Status: AC
  Filled 2023-01-24: qty 100

## 2023-01-24 MED ORDER — TIROFIBAN HCL IN NACL 5-0.9 MG/100ML-% IV SOLN
INTRAVENOUS | Status: AC
  Filled 2023-01-24: qty 100

## 2023-01-24 MED ORDER — NITROGLYCERIN 0.4 MG SL SUBL
0.4000 mg | SUBLINGUAL_TABLET | SUBLINGUAL | Status: DC | PRN
  Administered 2023-01-24: 0.4 mg via SUBLINGUAL
  Filled 2023-01-24: qty 1

## 2023-01-24 MED ORDER — LIDOCAINE HCL (PF) 1 % IJ SOLN
INTRAMUSCULAR | Status: DC | PRN
  Administered 2023-01-24: 2 mL

## 2023-01-24 SURGICAL SUPPLY — 18 items
BALLN SAPPHIRE 2.5X12 (BALLOONS) ×1
BALLN ~~LOC~~ EUPHORA RX 3.0X12 (BALLOONS) ×1
BALLOON SAPPHIRE 2.5X12 (BALLOONS) IMPLANT
BALLOON ~~LOC~~ EUPHORA RX 3.0X12 (BALLOONS) IMPLANT
CATH INFINITI JR4 5F (CATHETERS) IMPLANT
CATH VISTA GUIDE 6FR XBLAD3.5 (CATHETERS) IMPLANT
DEVICE RAD COMP TR BAND LRG (VASCULAR PRODUCTS) IMPLANT
GLIDESHEATH SLEND SS 6F .021 (SHEATH) IMPLANT
GUIDEWIRE INQWIRE 1.5J.035X260 (WIRE) IMPLANT
INQWIRE 1.5J .035X260CM (WIRE) ×1
KIT ENCORE 26 ADVANTAGE (KITS) IMPLANT
KIT SYRINGE INJ CVI SPIKEX1 (MISCELLANEOUS) IMPLANT
PACK CARDIAC CATHETERIZATION (CUSTOM PROCEDURE TRAY) ×1 IMPLANT
SET ATX-X65L (MISCELLANEOUS) IMPLANT
STENT SYNERGY XD 2.75X12 (Permanent Stent) IMPLANT
SYNERGY XD 2.75X12 (Permanent Stent) ×1 IMPLANT
TUBING CIL FLEX 10 FLL-RA (TUBING) IMPLANT
WIRE COUGAR XT STRL 190CM (WIRE) IMPLANT
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
## 2023-01-24 NOTE — ED Notes (Signed)
Attempted to call [phone]

## 2023-01-24 NOTE — ED Notes (Signed)
Attempted to call cath lab 4 times.   Called [phone] and [phone]

## 2023-01-24 NOTE — ED Notes (Signed)
ED Provider at bedside. 

## 2023-01-24 NOTE — H&P (Signed)
Cardiology Admission History and Physical   Patient ID: Bryan Craig MRN: [id number]; DOB: 28-Nov-1950   Admission date: 01/24/2023  PCP:  Marisue Brooklyn   [facility] Providers Cardiologist:  Allyson Sabal  Chief Complaint:  Chest pain  History of Present Illness:   [name] is a 72 yo male with HTN, HLD and CAD with NSTEMI in march 2024 with placement of a DES in the ramus branch at that time who is now transferred to [facility] from [facility] with chest pain. Chest pain started while riding his bike today. EKG with subtle lateral ST elevation. Code STEMI called by ED staff. Pt transported to [facility] for emergent cardiac cath. Pt arrived at [facility] c/o 4/10 chest pain.    Past Medical History:  Diagnosis Date   CAD (coronary artery disease)    Hyperlipidemia    Hypertension    Kidney stone    Retinal detachment     Past Surgical History:  Procedure Laterality Date   CORONARY STENT INTERVENTION N/A 07/22/2022   Procedure: CORONARY STENT INTERVENTION;  Surgeon: Kathleene Hazel, MD;  Location: [facility] INVASIVE CV LAB;  Service: Cardiovascular;  Laterality: N/A;   EYE SURGERY     LEFT HEART CATH AND CORONARY ANGIOGRAPHY N/A 07/22/2022   Procedure: LEFT HEART CATH AND CORONARY ANGIOGRAPHY;  Surgeon: Kathleene Hazel, MD;  Location: [facility] INVASIVE CV LAB;  Service: Cardiovascular;  Laterality: N/A;     Medications Prior to Admission: Prior to Admission medications   Medication Sig Start Date End Date Taking? Authorizing Provider  aspirin EC 81 MG tablet Take 1 tablet (81 mg total) by mouth daily. Swallow whole. 07/23/22   Almon Hercules, MD  atorvastatin (LIPITOR) 80 MG tablet Take 1 tablet (80 mg total) by mouth at bedtime. 01/15/23   Saguier, Ramon Dredge, PA-C  Difluprednate 0.05 % EMUL Administer 1 drop into the left eye daily. 08/06/15   [provider]  ezetimibe (ZETIA) 10 MG tablet Take 1 tablet (10 mg total) by mouth daily. 09/25/22 12/24/22  Runell Gess, MD  metoprolol succinate (TOPROL-XL) 50 MG 24 hr tablet TAKE 1 TABLET BY MOUTH EVERY DAY 11/06/22   Saguier, Ramon Dredge, PA-C  Risankizumab-rzaa [facility]) 150 MG/ML SOSY Inject into the skin once a week.    [provider]  telmisartan (MICARDIS) 80 MG tablet TAKE 1 TABLET BY MOUTH EVERY DAY 11/06/22   Saguier, Ramon Dredge, PA-C  ticagrelor (BRILINTA) 90 MG TABS tablet Take 1 tablet (90 mg total) by mouth 2 (two) times daily. 07/23/22   Almon Hercules, MD  triamcinolone cream (KENALOG) 0.1 % Apply 1 Application topically 2 (two) times daily. Patient taking differently: Apply 1 Application topically 2 (two) times daily as needed (irritation). 11/11/21   Saguier, Ramon Dredge, PA-C     Allergies:    Allergies  Allergen Reactions   Lisinopril     REACTION: cough   Penicillins     Social History:   Social History   Socioeconomic History   Marital status: Married    Spouse name: Annice Pih   Number of children: 3   Years of education: Not on file   Highest education level: Not on file  Occupational History   Occupation: retired    Comment: Civil Service fast streamer  Tobacco Use   Smoking status: Never   Smokeless tobacco: Never  Vaping Use   Vaping status: Never Used  Substance and Sexual Activity   Alcohol use: Yes    Comment: 1 beer  every 2-3 weeks   Drug use: Never   Sexual activity: Yes  Other Topics Concern   Not on file  Social History Narrative   Married, 2 daughters with current wife and 1 daughter from previous marriage   Social Determinants of Health   Financial Resource Strain: Low Risk  (11/25/2022)   Overall Financial Resource Strain (CARDIA)    Difficulty of Paying Living Expenses: Not very hard  Food Insecurity: No Food Insecurity (11/25/2022)   Hunger Vital Sign    Worried About Running Out of Food in the Last Year: Never true    Ran Out of Food in the Last Year: Never true  Transportation Needs: No Transportation Needs (11/25/2022)   PRAPARE -  Administrator, Civil Service (Medical): No    Lack of Transportation (Non-Medical): No  Physical Activity: Sufficiently Active (11/25/2022)   Exercise Vital Sign    Days of Exercise per Week: 7 days    Minutes of Exercise per Session: 30 min  Stress: No Stress Concern Present (11/25/2022)   [employer] of Occupational Health - Occupational Stress Questionnaire    Feeling of Stress : Not at all  Social Connections: Moderately Isolated (11/25/2022)   Social Connection and Isolation Panel [NHANES]    Frequency of Communication with Friends and Family: More than three times a week    Frequency of Social Gatherings with Friends and Family: More than three times a week    Attends Religious Services: Never    Database administrator or Organizations: No    Attends Banker Meetings: Never    Marital Status: Married  Catering manager Violence: Not At Risk (11/25/2022)   Humiliation, Afraid, Rape, and Kick questionnaire    Fear of Current or Ex-Partner: No    Emotionally Abused: No    Physically Abused: No    Sexually Abused: No    Family History:   The patient's family history includes Heart attack (age of onset: 54) in his father; Heart disease in his mother; Hyperlipidemia in his mother; Hypertension in his mother.    ROS:  Please see the history of present illness.  All other ROS reviewed and negative.     Physical Exam/Data:   Vitals:   01/24/23 1436 01/24/23 1445 01/24/23 1446 01/24/23 1448  BP:  111/82  115/74  Pulse:  68 70   Resp:  12    Temp: (!) 97.5 F (36.4 C)     TempSrc: Oral     SpO2:  95% 98%   Weight:      Height:       No intake or output data in the 24 hours ending 01/24/23 1520    01/24/2023    2:31 PM 11/30/2022    2:21 PM 11/25/2022   10:32 AM  Last 3 Weights  Weight (lbs) 175 lb 0.7 oz 175 lb 173 lb  Weight (kg) 79.4 kg 79.379 kg 78.472 kg     Body mass index is 27.42 kg/m.  General:  Well nourished, well developed, in no  acute distress HEENT: normal Neck: no JVD Vascular: No carotid bruits; Distal pulses 2+ bilaterally   Cardiac:  normal S1, S2; RRR; no murmur  Lungs:  clear to auscultation bilaterally, no wheezing, rhonchi or rales  Abd: soft, nontender, no hepatomegaly  Ext: no LE edema Musculoskeletal:  No deformities, BUE and BLE strength normal and equal Skin: warm and dry  Neuro:  CNs 2-12 intact, no focal abnormalities noted Psych:  Normal affect    EKG:  The ECG that was done was personally reviewed and demonstrates sinus rhythm with subtle ST elevation in the lateral leads and ST depression in leads V2-V4.   Relevant CV Studies:    Laboratory Data:  High Sensitivity Troponin:  No results for input(s): "TROPONINIHS" in the last 720 hours.    Chemistry Recent Labs  Lab 01/24/23 1435  NA 141  K 4.2  CL 106  CO2 23  GLUCOSE 120*  BUN 14  CREATININE 1.22  CALCIUM 9.6  GFRNONAA >60  ANIONGAP 12    No results for input(s): "PROT", "ALBUMIN", "AST", "ALT", "ALKPHOS", "BILITOT" in the last 168 hours. Lipids No results for input(s): "CHOL", "TRIG", "HDL", "LABVLDL", "LDLCALC", "CHOLHDL" in the last 168 hours. Hematology Recent Labs  Lab 01/24/23 1435  WBC 9.4  RBC 4.24  HGB 12.2*  HCT 37.5*  MCV 88.4  MCH 28.8  MCHC 32.5  RDW 13.2  PLT 316   Thyroid No results for input(s): "TSH", "FREET4" in the last 168 hours. BNPNo results for input(s): "BNP", "PROBNP" in the last 168 hours.  DDimer No results for input(s): "DDIMER" in the last 168 hours.   Radiology/Studies:  No results found.   Assessment and Plan:   CAD/Lateral ST elevation: He is having chest pain. His EKG is abnormal with ST changes. Will plan emergent cardiac cath.  HTN: Will resume home meds post cath HLD: Continue statin.   Code Status: Full Code  Severity of Illness: The appropriate patient status for this patient is INPATIENT. Inpatient status is judged to be reasonable and necessary in order to  provide the required intensity of service to ensure the patient's safety. The patient's presenting symptoms, physical exam findings, and initial radiographic and laboratory data in the context of their chronic comorbidities is felt to place them at high risk for further clinical deterioration. Furthermore, it is not anticipated that the patient will be medically stable for discharge from the hospital within 2 midnights of admission.   * I certify that at the point of admission it is my clinical judgment that the patient will require inpatient hospital care spanning beyond 2 midnights from the point of admission due to high intensity of service, high risk for further deterioration and high frequency of surveillance required.*   For questions or updates, please contact [facility] Please consult [email or web address] for contact info under     Signed, Verne Carrow, MD  01/24/2023 3:20 PM

## 2023-01-24 NOTE — Progress Notes (Signed)
   01/24/23 1539  Spiritual Encounters  Type of Visit Attempt (pt unavailable)  Reason for visit Code (STEMI)  OnCall Visit Yes   Chaplain responded to code STEMI. Patient was taken to the cath lab. Chaplain checked for family in the ED, 2H waiting area and the north tower entrance. Chaplain was unable to locate family.   Arlyce Dice, Chaplain Resident [phone]

## 2023-01-25 ENCOUNTER — Encounter (HOSPITAL_COMMUNITY): Payer: Self-pay | Admitting: Cardiovascular Disease

## 2023-01-25 ENCOUNTER — Other Ambulatory Visit (HOSPITAL_COMMUNITY): Payer: Self-pay

## 2023-01-25 DIAGNOSIS — I2129 ST elevation (STEMI) myocardial infarction involving other sites: Secondary | ICD-10-CM | POA: Diagnosis not present

## 2023-01-25 LAB — CBC
HCT: 34.1 % — ABNORMAL LOW (ref 39.0–52.0)
Hemoglobin: 11.4 g/dL — ABNORMAL LOW (ref 13.0–17.0)
MCH: 29.4 pg (ref 26.0–34.0)
MCHC: 33.4 g/dL (ref 30.0–36.0)
MCV: 87.9 fL (ref 80.0–100.0)
Platelets: 254 10*3/uL (ref 150–400)
RBC: 3.88 MIL/uL — ABNORMAL LOW (ref 4.22–5.81)
RDW: 13.2 % (ref 11.5–15.5)
WBC: 8.8 10*3/uL (ref 4.0–10.5)
nRBC: 0 % (ref 0.0–0.2)

## 2023-01-25 LAB — MAGNESIUM: Magnesium: 1.9 mg/dL (ref 1.7–2.4)

## 2023-01-25 LAB — BASIC METABOLIC PANEL
Anion gap: 7 (ref 5–15)
BUN: 13 mg/dL (ref 8–23)
CO2: 24 mmol/L (ref 22–32)
Calcium: 8.3 mg/dL — ABNORMAL LOW (ref 8.9–10.3)
Chloride: 103 mmol/L (ref 98–111)
Creatinine, Ser: 0.86 mg/dL (ref 0.61–1.24)
GFR, Estimated: >60 mL/min (ref >60)
Glucose, Bld: 108 mg/dL — ABNORMAL HIGH (ref 70–99)
Potassium: 3.6 mmol/L (ref 3.5–5.1)
Sodium: 134 mmol/L — ABNORMAL LOW (ref 135–145)

## 2023-01-25 MED ORDER — POTASSIUM CHLORIDE CRYS ER 20 MEQ PO TBCR
40.0000 meq | EXTENDED_RELEASE_TABLET | Freq: Once | ORAL | Status: AC
  Administered 2023-01-25: 40 meq via ORAL
  Filled 2023-01-25: qty 2

## 2023-01-25 NOTE — Plan of Care (Signed)

## 2023-01-25 NOTE — Progress Notes (Signed)
Rounding Note    Patient Name: Bryan Craig Date of Encounter: 01/25/2023  [facility] Cardiologist: Dr. Nanetta Craig  Subjective   Postop day 1 lateral STEMI treated with PCI and stenting of the previously instrumented ramus intermedius branch by Dr. Clifton Craig.  He is pain-free this morning.  Inpatient Medications    Scheduled Meds:  aspirin EC  81 mg Oral Daily   atorvastatin  80 mg Oral QHS   Chlorhexidine Gluconate Cloth  6 each Topical Daily   ezetimibe  10 mg Oral Daily   irbesartan  75 mg Oral Daily   metoprolol succinate  50 mg Oral Daily   prasugrel  10 mg Oral Daily   sodium chloride flush  3 mL Intravenous Q12H   Continuous Infusions:  sodium chloride     sodium chloride     nitroGLYCERIN     PRN Meds: sodium chloride, acetaminophen, nitroGLYCERIN, ondansetron (ZOFRAN) IV, mouth rinse, sodium chloride flush   Vital Signs    Vitals:   01/25/23 0400 01/25/23 0500 01/25/23 0600 01/25/23 0700  BP: 123/77 (!) 149/84 (!) 139/125 125/81  Pulse: (!) 57 74 77 68  Resp: 15 17 (!) 21 (!) 22  Temp:      TempSrc:      SpO2: 96% 98% 99% 97%  Weight:      Height:        Intake/Output Summary (Last 24 hours) at 01/25/2023 0800 Last data filed at 01/25/2023 0400 Gross per 24 hour  Intake 578.36 ml  Output 900 ml  Net -321.64 ml      01/24/2023    2:31 PM 11/30/2022    2:21 PM 11/25/2022   10:32 AM  Last 3 Weights  Weight (lbs) 175 lb 0.7 oz 175 lb 173 lb  Weight (kg) 79.4 kg 79.379 kg 78.472 kg      Telemetry    Sinus rhythm with PVCs- Personally Reviewed  ECG    Normal sinus rhythm at 63 without ST or T wave changes- Personally Reviewed  Physical Exam   GEN: No acute distress.   Neck: No JVD Cardiac: RRR, no murmurs, rubs, or gallops.  Respiratory: Clear to auscultation bilaterally. GI: Soft, nontender, non-distended  MS: No edema; No deformity. Neuro:  Nonfocal  Psych: Normal affect   Labs    High Sensitivity Troponin:    Recent Labs  Lab 01/24/23 1435 01/24/23 1848  TROPONINIHS 9 >24,000*     Chemistry Recent Labs  Lab 01/24/23 1435 01/25/23 0318  NA 141 134*  K 4.2 3.6  CL 106 103  CO2 23 24  GLUCOSE 120* 108*  BUN 14 13  CREATININE 1.22 0.86  CALCIUM 9.6 8.3*  GFRNONAA >60 >60  ANIONGAP 12 7    Lipids  Recent Labs  Lab 01/24/23 1435  CHOL 124  TRIG 79  HDL 37*  LDLCALC 71  CHOLHDL 3.4    Hematology Recent Labs  Lab 01/24/23 1435 01/25/23 0318  WBC 9.4 8.8  RBC 4.24 3.88*  HGB 12.2* 11.4*  HCT 37.5* 34.1*  MCV 88.4 87.9  MCH 28.8 29.4  MCHC 32.5 33.4  RDW 13.2 13.2  PLT 316 254   Thyroid No results for input(s): "TSH", "FREET4" in the last 168 hours.  BNPNo results for input(s): "BNP", "PROBNP" in the last 168 hours.  DDimer No results for input(s): "DDIMER" in the last 168 hours.   Radiology    DG Chest Port 1 View  Result Date: 01/24/2023 CLINICAL DATA:  ST-elevation  myocardial infarction. EXAM: PORTABLE CHEST 1 VIEW COMPARISON:  07/21/2022 FINDINGS: Artifact overlies the chest. Heart and mediastinal shadows are normal. The lungs are clear. The vascularity is normal. No effusions. IMPRESSION: No active disease. Electronically Signed   By: Bryan Craig M.D.   On: 01/24/2023 17:27   CARDIAC CATHETERIZATION  Result Date: 01/24/2023   Suezanne Jacquet Cx to Prox Cx lesion is 30% stenosed.   Prox RCA lesion is 30% stenosed.   Mid RCA lesion is 30% stenosed.   Prox Cx to Mid Cx lesion is 50% stenosed.   Ramus lesion is 99% stenosed.   Mid LAD lesion is 60% stenosed.   A drug-eluting stent was successfully placed using a SYNERGY XD 2.75X12.   Post intervention, there is a 0% residual stenosis. Acute lateral STEMI secondary to thrombotic sub-total occlusion on the proximal edge of the old stent Successful PTCA/DES x 1 proximal segment of the ramus intermediate branch overlapping with the old stent Moderate mid LAD stenosis. Moderate mid Circumflex stenosis Mild stenosis in the RCA LVEDP 13  mmHg Recommendations: Will admit to the ICU. Will change Brilinta to Effient. Effient load given in the cath lab. Aggrastat infusion 2 hours post cath. Continue DAPT with ASA and Effient for lifetime if he tolerates as uncertain mechanism of thrombosis on the proximal edge of the old stent. Continue statin and Zetia. Echo tomorrow. If he has angina, could consider pressure wire analysis of the LAD which appears to be an eccentric moderate stenosis. PCI of the LAD would involve stenting across a large diagonal branch so would favor medical management of this lesion if he does not have angina.    Cardiac Studies   Cardiac catheterization/PCI and stent (01/24/2023)  Conclusion      Ost Cx to Prox Cx lesion is 30% stenosed.   Prox RCA lesion is 30% stenosed.   Mid RCA lesion is 30% stenosed.   Prox Cx to Mid Cx lesion is 50% stenosed.   Ramus lesion is 99% stenosed.   Mid LAD lesion is 60% stenosed.   A drug-eluting stent was successfully placed using a SYNERGY XD 2.75X12.   Post intervention, there is a 0% residual stenosis.   Acute lateral STEMI secondary to thrombotic sub-total occlusion on the proximal edge of the old stent Successful PTCA/DES x 1 proximal segment of the ramus intermediate branch overlapping with the old stent Moderate mid LAD stenosis.  Moderate mid Circumflex stenosis Mild stenosis in the RCA LVEDP 13 mmHg   Recommendations: Will admit to the ICU. Will change Brilinta to Effient. Effient load given in the cath lab. Aggrastat infusion 2 hours post cath. Continue DAPT with ASA and Effient for lifetime if he tolerates as uncertain mechanism of thrombosis on the proximal edge of the old stent. Continue statin and Zetia. Echo tomorrow. If he has angina, could consider pressure wire analysis of the LAD which appears to be an eccentric moderate stenosis. PCI of the LAD would involve stenting across a large diagonal branch so would favor medical management of this lesion if he does  not have angina.     Dominance: Right  Intervention    Implants   Patient Profile     Mr. Bryan Craig is a 72 yo male with HTN, HLD and CAD with NSTEMI in march 2024 with placement of a DES in the ramus branch at that time who is now transferred to [facility] from [facility] with chest pain. Chest pain started while riding his bike today.  EKG with subtle lateral ST elevation. Code STEMI called by ED staff. Pt transported to [facility] for emergent cardiac cath. Pt arrived at [facility] c/o 4/10 chest pain.   Assessment & Plan    1: Lateral STEMI-history of ramus branch stenting by Dr. Clifton Craig 07/22/2022.  Cath performed yesterday showed 99% stenosis just proximal to the previously placed stent with TIMI I flow.  His troponin increased to greater than 24,000.  He had restenting using a 2.75 x 12 mm long Synergy drug-eluting stent.  He did have moderate proximal segmental LAD disease which was elected to be treated medically.  He he was on aspirin and Brilinta which was changed to Effient.  2: Hyperlipidemia-on statin therapy with an excellent lipid profile  3: Essential hypertension-on a beta-blocker and ARB with well-controlled blood pressures.  Patient is postop day 1 lateral STEMI.  He is clinically stable.  Plan to transfer to telemetry today, ambulate.  2D echo pending.  Anticipate discharge tomorrow.  For questions or updates, please contact [facility] Please consult [email or web address] for contact info under        Signed, Bryan Batty, MD  01/25/2023, 8:00 AM

## 2023-01-25 NOTE — TOC Benefit Eligibility Note (Signed)
Patient Product/process development scientist completed.    The patient is insured through [facility]. Patient has Medicare and is not eligible for a copay card, but may be able to apply for patient assistance, if available.    Ran test claim for prasugrel (Effient) 10 mg and the current 30 day co-pay is $0.00.   This test claim was processed through [facility]- copay amounts may vary at other pharmacies due to pharmacy/plan contracts, or as the patient moves through the different stages of their insurance plan.     Roland Earl, CPHT Pharmacy Technician III Certified Patient Advocate [facility] Patient Advocate Team Direct Number: [phone]  Fax: [phone]

## 2023-01-25 NOTE — Progress Notes (Signed)
CARDIAC REHAB PHASE I   PRE:  Rate/Rhythm: 69 NSR  BP:  Sitting: 125/84       SaO2: 99 RA  MODE:  Ambulation: 370 ft   AD:   no AD  POST:  Rate/Rhythm: 86 NSR  BP:  Sitting: 125/84      SaO2: 98 RA  Pt amb with supervision assistance, pt tolerated walk well w/o chest pain or sob  Pt was educated on  Antiplatelet and ASA use, wt restrictions, no baths/daily wash-ups, s/s of infection, ex guidelines, s/s to stop exercising, NTG use and calling 911, heart healthy diet, risk factors and CRPII. Pt received MI book and materials(in spanish) on exercise, diet, and CRPII. Will refer to [facility].   Pt completed cardiac rehab in the past but is interested in doing it again, will send referral to [facility]  Bryan Craig  ACSM-CEP 9:26 AM 01/25/2023    Service time is from 0850 to 0915.

## 2023-01-26 ENCOUNTER — Inpatient Hospital Stay (HOSPITAL_COMMUNITY): Payer: Medicare Other

## 2023-01-26 ENCOUNTER — Other Ambulatory Visit (HOSPITAL_COMMUNITY): Payer: Self-pay

## 2023-01-26 ENCOUNTER — Telehealth: Payer: Self-pay | Admitting: Cardiology

## 2023-01-26 DIAGNOSIS — I5021 Acute systolic (congestive) heart failure: Secondary | ICD-10-CM | POA: Insufficient documentation

## 2023-01-26 DIAGNOSIS — I2129 ST elevation (STEMI) myocardial infarction involving other sites: Secondary | ICD-10-CM | POA: Diagnosis not present

## 2023-01-26 DIAGNOSIS — R7303 Prediabetes: Secondary | ICD-10-CM | POA: Insufficient documentation

## 2023-01-26 LAB — ECHOCARDIOGRAM COMPLETE
Area-P 1/2: 1.64 cm2
Est EF: 45
Height: 67 in
S' Lateral: 3.4 cm
Weight: 2800.72 [oz_av]

## 2023-01-26 LAB — BASIC METABOLIC PANEL
Anion gap: 5 (ref 5–15)
BUN: 12 mg/dL (ref 8–23)
CO2: 26 mmol/L (ref 22–32)
Calcium: 8.6 mg/dL — ABNORMAL LOW (ref 8.9–10.3)
Chloride: 104 mmol/L (ref 98–111)
Creatinine, Ser: 0.97 mg/dL (ref 0.61–1.24)
GFR, Estimated: >60 mL/min (ref >60)
Glucose, Bld: 99 mg/dL (ref 70–99)
Potassium: 3.8 mmol/L (ref 3.5–5.1)
Sodium: 135 mmol/L (ref 135–145)

## 2023-01-26 MED ORDER — PRASUGREL HCL 10 MG PO TABS
10.0000 mg | ORAL_TABLET | Freq: Every day | ORAL | 3 refills | Status: DC
  Filled 2023-01-26: qty 30, 30d supply, fill #0
  Filled 2023-02-22: qty 30, 30d supply, fill #1
  Filled 2023-03-23: qty 30, 30d supply, fill #0
  Filled 2023-04-22: qty 30, 30d supply, fill #1
  Filled 2023-05-22: qty 30, 30d supply, fill #2
  Filled 2023-06-21 (×2): qty 30, 30d supply, fill #3
  Filled 2023-07-21: qty 30, 30d supply, fill #4
  Filled 2023-08-20: qty 30, 30d supply, fill #5
  Filled 2023-09-17: qty 30, 30d supply, fill #6
  Filled 2023-10-18: qty 30, 30d supply, fill #7
  Filled 2023-11-16: qty 30, 30d supply, fill #8
  Filled 2023-12-13: qty 30, 30d supply, fill #9

## 2023-01-26 MED ORDER — PERFLUTREN LIPID MICROSPHERE
1.0000 mL | INTRAVENOUS | Status: AC | PRN
  Administered 2023-01-26: 2 mL via INTRAVENOUS

## 2023-01-26 MED ORDER — NITROGLYCERIN 0.4 MG SL SUBL
0.4000 mg | SUBLINGUAL_TABLET | SUBLINGUAL | 2 refills | Status: DC | PRN
  Filled 2023-01-26: qty 25, 7d supply, fill #0

## 2023-01-26 MED ORDER — POTASSIUM CHLORIDE CRYS ER 20 MEQ PO TBCR
40.0000 meq | EXTENDED_RELEASE_TABLET | Freq: Once | ORAL | Status: AC
  Administered 2023-01-26: 40 meq via ORAL
  Filled 2023-01-26: qty 2

## 2023-01-26 NOTE — Plan of Care (Signed)

## 2023-01-26 NOTE — Progress Notes (Signed)
Pt in chair, pt independent with ambulation denies having chest pain while ambulating in room today. He walked the hall yesterday without chest pain. Pt denies questions from yesterday's education. I encouraged CR again.  Faustino Congress MS, ACSM-CEP 01/26/2023 8:35 AM  [phone]

## 2023-01-26 NOTE — Discharge Summary (Addendum)
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   Prox RCA lesion is 30% stenosed.   Mid RCA lesion is 30% stenosed.   Prox Cx to Mid Cx lesion is 50% stenosed.   Ramus lesion is 99% stenosed.   Mid LAD lesion is 60% stenosed.   A drug-eluting stent was successfully placed using a SYNERGY XD 2.75X12.   Post intervention, there is a 0% residual stenosis.   Acute lateral STEMI secondary to thrombotic sub-total occlusion on the proximal edge of the old stent Successful PTCA/DES x 1 proximal segment of the ramus intermediate branch overlapping with the old stent Moderate mid LAD stenosis.  Moderate mid Circumflex stenosis Mild stenosis in the RCA LVEDP 13 mmHg   Recommendations: Will admit to the ICU. Will change Brilinta to Effient. Effient load given in the cath lab. Aggrastat infusion 2 hours post cath. Continue DAPT with ASA and Effient for lifetime if he tolerates as uncertain mechanism of thrombosis on the proximal edge of the old stent. Continue statin and Zetia. Echo tomorrow. If he has angina, could consider pressure wire analysis of the LAD which appears to be an eccentric moderate stenosis. PCI of the LAD would involve stenting across a large diagonal branch so would favor medical management of this lesion if he does not have angina.   Diagnostic Dominance: Right  Intervention    Echo: 01/26/2023  IMPRESSIONS     1. Left  ventricular ejection fraction, by estimation, is 45%. The left  ventricle has midlly decreased function. The left ventricle demonstrates  regional wall motion abnormalities with basal to mid inferolateral  akinesis and basal to mid inferior  hypokinesis. There is mild concentric left ventricular hypertrophy. Left  ventricular diastolic parameters are consistent with Grade I diastolic  dysfunction (impaired relaxation).   2. Peak RV-RA gradient 15 mmHg. Right ventricular systolic function is  mildly reduced. The right ventricular size is normal.   3. The mitral valve is normal in structure. Trivial mitral valve  regurgitation. No evidence of mitral stenosis.   4. The aortic valve is tricuspid. There is mild calcification of the  aortic valve. Aortic valve regurgitation is not visualized. No aortic  stenosis is present.   5. The IVC was not visualized.   FINDINGS   Left Ventricle: Left ventricular ejection fraction, by estimation, is  45%. The left ventricle has mildly decreased function. The left ventricle  demonstrates regional wall motion abnormalities. Definity contrast agent  was given IV to delineate the left  ventricular endocardial borders. The left ventricular internal cavity size  was normal in size. There is mild concentric left ventricular hypertrophy.  Left ventricular diastolic parameters are consistent with Grade I  diastolic dysfunction (impaired  relaxation).   Right Ventricle: Peak RV-RA gradient 15 mmHg. The right ventricular size  is normal. No increase in right ventricular wall thickness. Right  ventricular systolic function is mildly reduced.   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   Prox RCA lesion is 30% stenosed.   Mid RCA lesion is 30% stenosed.   Prox Cx to Mid Cx lesion is 50% stenosed.   Ramus lesion is 99% stenosed.   Mid LAD lesion is 60% stenosed.   A drug-eluting stent was successfully placed using a SYNERGY XD 2.75X12.   Post intervention, there is a 0% residual stenosis.   Acute lateral STEMI secondary to thrombotic sub-total occlusion on the proximal edge of the old stent Successful PTCA/DES x 1 proximal segment of the ramus intermediate branch overlapping with the old stent Moderate mid LAD stenosis.  Moderate mid Circumflex stenosis Mild stenosis in the RCA LVEDP 13 mmHg   Recommendations: Will admit to the ICU. Will change Brilinta to Effient. Effient load given in the cath lab. Aggrastat infusion 2 hours post cath. Continue DAPT with ASA and Effient for lifetime if he tolerates as uncertain mechanism of thrombosis on the proximal edge of the old stent. Continue statin and Zetia. Echo tomorrow. If he has angina, could consider pressure wire analysis of the LAD which appears to be an eccentric moderate stenosis. PCI of the LAD would involve stenting across a large diagonal branch so would favor medical management of this lesion if he does not have angina.   Diagnostic Dominance: Right  Intervention    Echo: 01/26/2023  IMPRESSIONS     1. Left  ventricular ejection fraction, by estimation, is 45%. The left  ventricle has midlly decreased function. The left ventricle demonstrates  regional wall motion abnormalities with basal to mid inferolateral  akinesis and basal to mid inferior  hypokinesis. There is mild concentric left ventricular hypertrophy. Left  ventricular diastolic parameters are consistent with Grade I diastolic  dysfunction (impaired relaxation).   2. Peak RV-RA gradient 15 mmHg. Right ventricular systolic function is  mildly reduced. The right ventricular size is normal.   3. The mitral valve is normal in structure. Trivial mitral valve  regurgitation. No evidence of mitral stenosis.   4. The aortic valve is tricuspid. There is mild calcification of the  aortic valve. Aortic valve regurgitation is not visualized. No aortic  stenosis is present.   5. The IVC was not visualized.   FINDINGS   Left Ventricle: Left ventricular ejection fraction, by estimation, is  45%. The left ventricle has mildly decreased function. The left ventricle  demonstrates regional wall motion abnormalities. Definity contrast agent  was given IV to delineate the left  ventricular endocardial borders. The left ventricular internal cavity size  was normal in size. There is mild concentric left ventricular hypertrophy.  Left ventricular diastolic parameters are consistent with Grade I  diastolic dysfunction (impaired  relaxation).   Right Ventricle: Peak RV-RA gradient 15 mmHg. The right ventricular size  is normal. No increase in right ventricular wall thickness. Right  ventricular systolic function is mildly reduced.   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   Prox RCA lesion is 30% stenosed.   Mid RCA lesion is 30% stenosed.   Prox Cx to Mid Cx lesion is 50% stenosed.   Ramus lesion is 99% stenosed.   Mid LAD lesion is 60% stenosed.   A drug-eluting stent was successfully placed using a SYNERGY XD 2.75X12.   Post intervention, there is a 0% residual stenosis.   Acute lateral STEMI secondary to thrombotic sub-total occlusion on the proximal edge of the old stent Successful PTCA/DES x 1 proximal segment of the ramus intermediate branch overlapping with the old stent Moderate mid LAD stenosis.  Moderate mid Circumflex stenosis Mild stenosis in the RCA LVEDP 13 mmHg   Recommendations: Will admit to the ICU. Will change Brilinta to Effient. Effient load given in the cath lab. Aggrastat infusion 2 hours post cath. Continue DAPT with ASA and Effient for lifetime if he tolerates as uncertain mechanism of thrombosis on the proximal edge of the old stent. Continue statin and Zetia. Echo tomorrow. If he has angina, could consider pressure wire analysis of the LAD which appears to be an eccentric moderate stenosis. PCI of the LAD would involve stenting across a large diagonal branch so would favor medical management of this lesion if he does not have angina.   Diagnostic Dominance: Right  Intervention    Echo: 01/26/2023  IMPRESSIONS     1. Left  ventricular ejection fraction, by estimation, is 45%. The left  ventricle has midlly decreased function. The left ventricle demonstrates  regional wall motion abnormalities with basal to mid inferolateral  akinesis and basal to mid inferior  hypokinesis. There is mild concentric left ventricular hypertrophy. Left  ventricular diastolic parameters are consistent with Grade I diastolic  dysfunction (impaired relaxation).   2. Peak RV-RA gradient 15 mmHg. Right ventricular systolic function is  mildly reduced. The right ventricular size is normal.   3. The mitral valve is normal in structure. Trivial mitral valve  regurgitation. No evidence of mitral stenosis.   4. The aortic valve is tricuspid. There is mild calcification of the  aortic valve. Aortic valve regurgitation is not visualized. No aortic  stenosis is present.   5. The IVC was not visualized.   FINDINGS   Left Ventricle: Left ventricular ejection fraction, by estimation, is  45%. The left ventricle has mildly decreased function. The left ventricle  demonstrates regional wall motion abnormalities. Definity contrast agent  was given IV to delineate the left  ventricular endocardial borders. The left ventricular internal cavity size  was normal in size. There is mild concentric left ventricular hypertrophy.  Left ventricular diastolic parameters are consistent with Grade I  diastolic dysfunction (impaired  relaxation).   Right Ventricle: Peak RV-RA gradient 15 mmHg. The right ventricular size  is normal. No increase in right ventricular wall thickness. Right  ventricular systolic function is mildly reduced.   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   Prox RCA lesion is 30% stenosed.   Mid RCA lesion is 30% stenosed.   Prox Cx to Mid Cx lesion is 50% stenosed.   Ramus lesion is 99% stenosed.   Mid LAD lesion is 60% stenosed.   A drug-eluting stent was successfully placed using a SYNERGY XD 2.75X12.   Post intervention, there is a 0% residual stenosis.   Acute lateral STEMI secondary to thrombotic sub-total occlusion on the proximal edge of the old stent Successful PTCA/DES x 1 proximal segment of the ramus intermediate branch overlapping with the old stent Moderate mid LAD stenosis.  Moderate mid Circumflex stenosis Mild stenosis in the RCA LVEDP 13 mmHg   Recommendations: Will admit to the ICU. Will change Brilinta to Effient. Effient load given in the cath lab. Aggrastat infusion 2 hours post cath. Continue DAPT with ASA and Effient for lifetime if he tolerates as uncertain mechanism of thrombosis on the proximal edge of the old stent. Continue statin and Zetia. Echo tomorrow. If he has angina, could consider pressure wire analysis of the LAD which appears to be an eccentric moderate stenosis. PCI of the LAD would involve stenting across a large diagonal branch so would favor medical management of this lesion if he does not have angina.   Diagnostic Dominance: Right  Intervention    Echo: 01/26/2023  IMPRESSIONS     1. Left  ventricular ejection fraction, by estimation, is 45%. The left  ventricle has midlly decreased function. The left ventricle demonstrates  regional wall motion abnormalities with basal to mid inferolateral  akinesis and basal to mid inferior  hypokinesis. There is mild concentric left ventricular hypertrophy. Left  ventricular diastolic parameters are consistent with Grade I diastolic  dysfunction (impaired relaxation).   2. Peak RV-RA gradient 15 mmHg. Right ventricular systolic function is  mildly reduced. The right ventricular size is normal.   3. The mitral valve is normal in structure. Trivial mitral valve  regurgitation. No evidence of mitral stenosis.   4. The aortic valve is tricuspid. There is mild calcification of the  aortic valve. Aortic valve regurgitation is not visualized. No aortic  stenosis is present.   5. The IVC was not visualized.   FINDINGS   Left Ventricle: Left ventricular ejection fraction, by estimation, is  45%. The left ventricle has mildly decreased function. The left ventricle  demonstrates regional wall motion abnormalities. Definity contrast agent  was given IV to delineate the left  ventricular endocardial borders. The left ventricular internal cavity size  was normal in size. There is mild concentric left ventricular hypertrophy.  Left ventricular diastolic parameters are consistent with Grade I  diastolic dysfunction (impaired  relaxation).   Right Ventricle: Peak RV-RA gradient 15 mmHg. The right ventricular size  is normal. No increase in right ventricular wall thickness. Right  ventricular systolic function is mildly reduced.   Left Atrium: Left  days.  A message has been sent to the [facility] and Scheduling Pool at the office where the patient should be seen for follow up.  _____________  Discharge Vitals Blood pressure 99/64, pulse 67, temperature 97.7 F (36.5 C), temperature source Oral, resp. rate 16, height 5\' 7"  (1.702 m), weight 79.4 kg, SpO2 98%.  Filed Weights   01/24/23 1431  Weight: 79.4 kg    Labs & Radiologic Studies    CBC Recent Labs    01/24/23 1435 01/25/23 0318  WBC 9.4 8.8  HGB 12.2* 11.4*  HCT 37.5* 34.1*  MCV 88.4 87.9  PLT 316 254   Basic Metabolic Panel Recent Labs    16/10/96 0318 01/25/23 1010 01/26/23 0503  NA 134*  --  135  K 3.6  --  3.8  CL 103  --  104  CO2 24  --  26  GLUCOSE 108*  --  99  BUN 13  --  12   CREATININE 0.86  --  0.97  CALCIUM 8.3*  --  8.6*  MG  --  1.9  --    Liver Function Tests No results for input(s): "AST", "ALT", "ALKPHOS", "BILITOT", "PROT", "ALBUMIN" in the last 72 hours. No results for input(s): "LIPASE", "AMYLASE" in the last 72 hours. High Sensitivity Troponin:   Recent Labs  Lab 01/24/23 1435 01/24/23 1848  TROPONINIHS 9 >24,000*    BNP Invalid input(s): "POCBNP" D-Dimer No results for input(s): "DDIMER" in the last 72 hours. Hemoglobin A1C Recent Labs    01/24/23 1435  HGBA1C 6.1*   Fasting Lipid Panel Recent Labs    01/24/23 1435  CHOL 124  HDL 37*  LDLCALC 71  TRIG 79  CHOLHDL 3.4   Thyroid Function Tests No results for input(s): "TSH", "T4TOTAL", "T3FREE", "THYROIDAB" in the last 72 hours.  Invalid input(s): "FREET3" _____________  ECHOCARDIOGRAM COMPLETE  Result Date: 01/26/2023    ECHOCARDIOGRAM REPORT   Patient Name:   Bryan Craig Date of Exam: 01/26/2023 Medical Rec #:  [id number]     Height:       67.0 in Accession #:    [id number]    Weight:       175.0 lb Date of Birth:  23-May-1950     BSA:          1.911 m Patient Age:    72 years      BP:           110/69 mmHg Patient Gender: M             HR:           57 bpm. Exam Location:  Inpatient Procedure: 2D Echo, Cardiac Doppler and Color Doppler Indications:    MI  History:        Patient has prior history of Echocardiogram examinations, most                 recent 07/23/2022. Previous Myocardial Infarction,                 Signs/Symptoms:Chest Pain; Risk Factors:Hypertension and                 Dyslipidemia.  Sonographer:    Melissa Morford RDCS (AE, PE) Referring Phys: [phone] LINDSAY B ROBERTS IMPRESSIONS  1. Left ventricular ejection fraction, by estimation, is 45%. The left ventricle has midlly decreased function. The left ventricle demonstrates regional wall motion abnormalities with basal to mid inferolateral akinesis and basal to mid inferior hypokinesis. 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   Prox RCA lesion is 30% stenosed.   Mid RCA lesion is 30% stenosed.   Prox Cx to Mid Cx lesion is 50% stenosed.   Ramus lesion is 99% stenosed.   Mid LAD lesion is 60% stenosed.   A drug-eluting stent was successfully placed using a SYNERGY XD 2.75X12.   Post intervention, there is a 0% residual stenosis.   Acute lateral STEMI secondary to thrombotic sub-total occlusion on the proximal edge of the old stent Successful PTCA/DES x 1 proximal segment of the ramus intermediate branch overlapping with the old stent Moderate mid LAD stenosis.  Moderate mid Circumflex stenosis Mild stenosis in the RCA LVEDP 13 mmHg   Recommendations: Will admit to the ICU. Will change Brilinta to Effient. Effient load given in the cath lab. Aggrastat infusion 2 hours post cath. Continue DAPT with ASA and Effient for lifetime if he tolerates as uncertain mechanism of thrombosis on the proximal edge of the old stent. Continue statin and Zetia. Echo tomorrow. If he has angina, could consider pressure wire analysis of the LAD which appears to be an eccentric moderate stenosis. PCI of the LAD would involve stenting across a large diagonal branch so would favor medical management of this lesion if he does not have angina.   Diagnostic Dominance: Right  Intervention    Echo: 01/26/2023  IMPRESSIONS     1. Left  ventricular ejection fraction, by estimation, is 45%. The left  ventricle has midlly decreased function. The left ventricle demonstrates  regional wall motion abnormalities with basal to mid inferolateral  akinesis and basal to mid inferior  hypokinesis. There is mild concentric left ventricular hypertrophy. Left  ventricular diastolic parameters are consistent with Grade I diastolic  dysfunction (impaired relaxation).   2. Peak RV-RA gradient 15 mmHg. Right ventricular systolic function is  mildly reduced. The right ventricular size is normal.   3. The mitral valve is normal in structure. Trivial mitral valve  regurgitation. No evidence of mitral stenosis.   4. The aortic valve is tricuspid. There is mild calcification of the  aortic valve. Aortic valve regurgitation is not visualized. No aortic  stenosis is present.   5. The IVC was not visualized.   FINDINGS   Left Ventricle: Left ventricular ejection fraction, by estimation, is  45%. The left ventricle has mildly decreased function. The left ventricle  demonstrates regional wall motion abnormalities. Definity contrast agent  was given IV to delineate the left  ventricular endocardial borders. The left ventricular internal cavity size  was normal in size. There is mild concentric left ventricular hypertrophy.  Left ventricular diastolic parameters are consistent with Grade I  diastolic dysfunction (impaired  relaxation).   Right Ventricle: Peak RV-RA gradient 15 mmHg. The right ventricular size  is normal. No increase in right ventricular wall thickness. Right  ventricular systolic function is mildly reduced.   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   Prox RCA lesion is 30% stenosed.   Mid RCA lesion is 30% stenosed.   Prox Cx to Mid Cx lesion is 50% stenosed.   Ramus lesion is 99% stenosed.   Mid LAD lesion is 60% stenosed.   A drug-eluting stent was successfully placed using a SYNERGY XD 2.75X12.   Post intervention, there is a 0% residual stenosis.   Acute lateral STEMI secondary to thrombotic sub-total occlusion on the proximal edge of the old stent Successful PTCA/DES x 1 proximal segment of the ramus intermediate branch overlapping with the old stent Moderate mid LAD stenosis.  Moderate mid Circumflex stenosis Mild stenosis in the RCA LVEDP 13 mmHg   Recommendations: Will admit to the ICU. Will change Brilinta to Effient. Effient load given in the cath lab. Aggrastat infusion 2 hours post cath. Continue DAPT with ASA and Effient for lifetime if he tolerates as uncertain mechanism of thrombosis on the proximal edge of the old stent. Continue statin and Zetia. Echo tomorrow. If he has angina, could consider pressure wire analysis of the LAD which appears to be an eccentric moderate stenosis. PCI of the LAD would involve stenting across a large diagonal branch so would favor medical management of this lesion if he does not have angina.   Diagnostic Dominance: Right  Intervention    Echo: 01/26/2023  IMPRESSIONS     1. Left  ventricular ejection fraction, by estimation, is 45%. The left  ventricle has midlly decreased function. The left ventricle demonstrates  regional wall motion abnormalities with basal to mid inferolateral  akinesis and basal to mid inferior  hypokinesis. There is mild concentric left ventricular hypertrophy. Left  ventricular diastolic parameters are consistent with Grade I diastolic  dysfunction (impaired relaxation).   2. Peak RV-RA gradient 15 mmHg. Right ventricular systolic function is  mildly reduced. The right ventricular size is normal.   3. The mitral valve is normal in structure. Trivial mitral valve  regurgitation. No evidence of mitral stenosis.   4. The aortic valve is tricuspid. There is mild calcification of the  aortic valve. Aortic valve regurgitation is not visualized. No aortic  stenosis is present.   5. The IVC was not visualized.   FINDINGS   Left Ventricle: Left ventricular ejection fraction, by estimation, is  45%. The left ventricle has mildly decreased function. The left ventricle  demonstrates regional wall motion abnormalities. Definity contrast agent  was given IV to delineate the left  ventricular endocardial borders. The left ventricular internal cavity size  was normal in size. There is mild concentric left ventricular hypertrophy.  Left ventricular diastolic parameters are consistent with Grade I  diastolic dysfunction (impaired  relaxation).   Right Ventricle: Peak RV-RA gradient 15 mmHg. The right ventricular size  is normal. No increase in right ventricular wall thickness. Right  ventricular systolic function is mildly reduced.   Left Atrium: Left

## 2023-01-26 NOTE — TOC CM/SW Note (Signed)
Transition of Care [facility]) - Inpatient Brief Assessment   Patient Details  Name: Bryan Craig MRN: [id number] Date of Birth: Nov 27, 1950  Transition of Care [facility]) CM/SW Contact:    Gala Lewandowsky, RN Phone Number: 01/26/2023, 9:29 AM   Clinical Narrative: Patient presented for chest pain-post cath initiated on Effient. Benefit check completed for cost. Case Manager will continue to follow for transition of care needs.    Transition of Care Asessment: Insurance and Status: Insurance coverage has been reviewed Patient has primary care physician: Yes  Prior/Current Home Services: No current home services Social Determinants of Health Reivew: SDOH reviewed no interventions necessary Readmission risk has been reviewed: Yes Transition of care needs: no transition of care needs at this time

## 2023-01-26 NOTE — Telephone Encounter (Signed)
Transition of Care Follow-up Phone Call Request    Patient Name: Bryan Craig Date of Birth: 1950-10-12 Date of Encounter: 01/26/2023  Primary Care Provider:  Esperanza Richters, PA-C Primary Cardiologist:  Nanetta Batty, MD  [name] has been scheduled for a transition of care follow up appointment with a [facility] provider:  Azalee Course 10/9  Please reach out to [name] within 48 hours of discharge to confirm appointment and review transition of care protocol questionnaire. Anticipated discharge date: 10/1  Laverda Page, NP  01/26/2023, 12:37 PM

## 2023-01-27 NOTE — Telephone Encounter (Signed)
LM to call office

## 2023-01-28 NOTE — Telephone Encounter (Signed)
Patient contacted regarding discharge from [facility] on 01/26/23  Patient understands to follow up with provider Mengon 10/9 at 10:05 at  [facility] office Patient understands discharge instructions? Yes Patient understands medications and regiment? Yes Patient understands to bring all medications to this visit? Yes  Ask patient:  Are you enrolled in My Chart Yes               Do you have any questions about your medications?  All medications (except pain medications) are to be filled by your Cardiologist AFTER your first post op  appointment with them.  Are you taking your pain medication? No questions              How is your pain controlled? Pain level? None               If you require a refill on pain medications, know that the same medication/ amount may not be prescribed or a refill may not be given.  Please contact your pharmacy for refill requests.               Do you have help at home with ADL's? None needed

## 2023-01-29 ENCOUNTER — Telehealth (HOSPITAL_COMMUNITY): Payer: Self-pay

## 2023-01-29 ENCOUNTER — Telehealth: Payer: Self-pay | Admitting: *Deleted

## 2023-01-29 NOTE — Telephone Encounter (Signed)
Attempted to call patient in regards to Cardiac Rehab - LM on VM 

## 2023-01-29 NOTE — Transitions of Care (Post Inpatient/ED Visit) (Signed)
01/29/2023  Name: Bryan Craig MRN: [id number] DOB: 08-16-1950  Today's TOC FU Call Status: Today's TOC FU Call Status:: Unsuccessful Call (1st Attempt) Unsuccessful Call (1st Attempt) Date: 01/29/23  Attempted to reach the patient regarding the most recent Inpatient visit; Call facilitated by [facility]- [phone]; spoke with interpreter Byrd Hesselbach ID # [phone]- she left HIPAA compliant voice message   Follow Up Plan: Additional outreach attempts will be made to reach the patient to complete the Transitions of Care (Post Inpatient visit) call.   Caryl Pina, RN, BSN, Media planner  Transitions of Care  [facility] - [facility] [phone]: direct office

## 2023-01-29 NOTE — Telephone Encounter (Signed)
Pt insurance is active and benefits verified through [facility] Medicare. Co-pay $0.00, DED $0.00/$0.00 met, out of pocket $4,500.00/$1,218.28 met, co-insurance 0%. No pre-authorization required. Passport, 01/29/23 @ 1:26PM, REF#[id number]   How many CR sessions are covered? (36 visits for TCR, 72 visits for ICR)72 Is this a lifetime maximum or an annual maximum? Lifetime Has the member used any of these services to date? No Is there a time limit (weeks/months) on start of program and/or program completion? No     Will contact patient to see if he is interested in the Cardiac Rehab Program. If interested, patient will need to complete follow up appt. Once completed, patient will be contacted for scheduling upon review by the RN Navigator.

## 2023-02-01 ENCOUNTER — Telehealth: Payer: Self-pay | Admitting: *Deleted

## 2023-02-01 NOTE — Transitions of Care (Post Inpatient/ED Visit) (Signed)
02/01/2023  Name: Bryan Craig MRN: [id number] DOB: 1950/09/09  Today's TOC FU Call Status: Today's TOC FU Call Status:: Unsuccessful Call (2nd Attempt) Unsuccessful Call (2nd Attempt) Date: 02/01/23  Attempted to reach the patient regarding the most recent Inpatient visit;  Entirety of call facilitated by [employer] [phone]) Interpreter "Marquita Palms" ID [phone] - he left voice message  Follow Up Plan: Additional outreach attempts will be made to reach the patient to complete the Transitions of Care (Post Inpatient visit) call.   Caryl Pina, RN, BSN, Media planner  Transitions of Care  [facility] - [facility] [phone]: direct office

## 2023-02-02 ENCOUNTER — Telehealth: Payer: Self-pay | Admitting: *Deleted

## 2023-02-02 NOTE — Transitions of Care (Post Inpatient/ED Visit) (Signed)
02/02/2023  Name: Bryan Craig MRN: [id number] DOB: 1950/06/15  Today's TOC FU Call Status: Today's TOC FU Call Status:: Successful TOC FU Call Completed TOC FU Call Complete Date: 02/02/23 Patient's Name and Date of Birth confirmed.  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  Taking the new medicine like they told me to, going to see the heart doctor tomorrow.  The site where they did the heart procedure is looking fine, it is healed") Any questions or concerns?: No  Items Reviewed: Did you receive and understand the discharge instructions provided?: Yes (thoroughly reviewed with patient who verbalizes good understanding of same) Medications obtained,verified, and reconciled?: Yes (Medications Reviewed) (Full medication reconciliation/ review completed; no concerns or discrepancies identified; confirmed patient obtained/ is taking all newly Rx'd medications as instructed; self-manages medications and denies questions/ concerns around medications today) Any new allergies since your discharge?: No Dietary orders reviewed?: Yes Type of Diet Ordered:: Heart Healthy/ Low Salt Do you have support at home?: Yes People in Home: spouse, child(ren), adult Name of Support/Comfort Primary Source: Reports independent in self-care activities; Resides with supportive spouse and adult daughters-- assists as/ if needed/ indicated  Medications Reviewed Today: Medications Reviewed Today     Reviewed by Michaela Corner, RN (Registered Nurse) on 02/02/23 at 1119  Med List Status: <None>   Medication Order Taking? Sig  Documenting Provider Last Dose Status Informant  aspirin EC 81 MG tablet [id number] Yes Take 1 tablet (81 mg total) by mouth daily. Swallow whole. Almon Hercules, MD Taking Active Spouse/Significant Other, Multiple Informants  atorvastatin (LIPITOR) 80 MG tablet [id number] Yes Take 1 tablet (80 mg total) by mouth at bedtime.  Patient taking differently: Take 80 mg by mouth daily.   Saguier, Ramon Dredge, PA-C Taking Active Spouse/Significant Other, Multiple Informants  ezetimibe (ZETIA) 10 MG tablet [id number]  Take 1 tablet (10 mg total) by mouth daily. Runell Gess, MD  Expired 01/24/23 2359 Spouse/Significant Other, Multiple Informants           Med Note Michaela Corner   Tue Feb 02, 2023 11:10 AM) 02/02/23: Reports during TOC call he is currently taking  metoprolol succinate (TOPROL-XL) 50 MG 24 hr tablet [id number] Yes TAKE 1 TABLET BY MOUTH EVERY DAY Saguier, Ramon Dredge, PA-C Taking Active Spouse/Significant Other, Multiple Informants  nitroGLYCERIN (NITROSTAT) 0.4 MG SL tablet [id number] Yes Place 1 tablet (0.4 mg total) under the tongue every 5 (five) minutes as needed for chest pain (CP or SOB). Arty Baumgartner, NP Taking Active   prasugrel (EFFIENT) 10 MG TABS tablet [id number] Yes Take 1 tablet (10 mg total) by mouth daily. Arty Baumgartner, NP Taking Active   prednisoLONE acetate (PRED FORTE) 1 % ophthalmic suspension [id number] Yes Place 1 drop into the left eye daily. [provider] Taking Active Spouse/Significant Other, Multiple Informants  Risankizumab-rzaa [facility]) 150 MG/ML SOSY [id number] Yes Inject into the skin every 3 (three) months. [provider] Taking Active Spouse/Significant Other, Multiple Informants           Med Note (S[place], Genoveva Ill   Sun Jan 24, 2023  4:55 PM) Patient not sure of exact dose   telmisartan (MICARDIS) 80 MG  tablet [id number] Yes TAKE 1 TABLET BY MOUTH EVERY DAY Saguier, Ramon Dredge, PA-C Taking Active Spouse/Significant Other, Multiple  Informants  triamcinolone cream (KENALOG) 0.1 % [id number] Yes Apply 1 Application topically 2 (two) times daily.  Patient taking differently: Apply 1 Application topically 2 (two) times daily as needed (irritation).   Saguier, Ramon Dredge, PA-C Taking Active Spouse/Significant Other, Multiple Informants           Home Care and Equipment/Supplies: Were Home Health Services Ordered?: No Any new equipment or medical supplies ordered?: No  Functional Questionnaire: Do you need assistance with bathing/showering or dressing?: No Do you need assistance with meal preparation?: No Do you need assistance with eating?: No Do you have difficulty maintaining continence: No Do you need assistance with getting out of bed/getting out of a chair/moving?: No Do you have difficulty managing or taking your medications?: No  Follow up appointments reviewed: PCP Follow-up appointment confirmed?: NA (verified not indicated per hospital discharging provider discharge notes) Specialist Hospital Follow-up appointment confirmed?: Yes Date of Specialist follow-up appointment?: 02/03/23 Follow-Up Specialty Provider:: cardiology provider Do you need transportation to your follow-up appointment?: No Do you understand care options if your condition(s) worsen?: Yes-patient verbalized understanding  SDOH Interventions Today    Flowsheet Row Most Recent Value  SDOH Interventions   Food Insecurity Interventions Intervention Not Indicated  Transportation Interventions Intervention Not Indicated  [drives self at baseline,  family assists as/if needed]      TOC Interventions Today    Flowsheet Row Most Recent Value  TOC Interventions   TOC Interventions Discussed/Reviewed TOC Interventions Discussed, Post op wound/incision care  [Patient declines need for ongoing/ further care management/ coordination outreach,  no needs identified at time of TOC call today- declines enrollment in 30-day TOC program]       Interventions Today    Flowsheet Row Most Recent Value  Chronic Disease   Chronic disease during today's visit Hypertension (HTN), Other  [NSTEMI with PCI]  General Interventions   General Interventions Discussed/Reviewed General Interventions Discussed, Durable Medical Equipment (DME), Doctor Visits  Doctor Visits Discussed/Reviewed Doctor Visits Discussed, Specialist, PCP  Durable Medical Equipment (DME) Other  [confirmed not currently requiring/ using assistive devices for ambulation]  PCP/Specialist Visits Compliance with follow-up visit  Exercise Interventions   Exercise Discussed/Reviewed Exercise Discussed  Education Interventions   Education Provided Provided Education  Provided Verbal Education On Medication, Other, Nutrition, When to see the doctor  [safe use of NTG,  need to schedule with PCP/ call for concerns,  heart healthy low salt diet,  Encouraged to continue home monitoring/ recording of BP's,  reviewed home BP's:  "all just fine, " reports 117/68 this morning]  Nutrition Interventions   Nutrition Discussed/Reviewed Nutrition Discussed  Pharmacy Interventions   Pharmacy Dicussed/Reviewed Pharmacy Topics Discussed  [Full medication review with updating medication list in EHR per patient report]  Safety Interventions   Safety Discussed/Reviewed Safety Discussed      Caryl Pina, RN, BSN, CCRN Alumnus RN Care Manager  Transitions of Care  [facility]BCI - Population Health  [place] [phone]: direct office

## 2023-02-03 ENCOUNTER — Telehealth (HOSPITAL_COMMUNITY): Payer: Self-pay

## 2023-02-03 ENCOUNTER — Encounter: Payer: Self-pay | Admitting: Physician Assistant

## 2023-02-03 ENCOUNTER — Ambulatory Visit: Payer: Medicare Other | Attending: Physician Assistant | Admitting: Physician Assistant

## 2023-02-03 ENCOUNTER — Other Ambulatory Visit: Payer: Self-pay | Admitting: Physician Assistant

## 2023-02-03 ENCOUNTER — Encounter (HOSPITAL_COMMUNITY): Payer: Self-pay

## 2023-02-03 VITALS — BP 118/72 | HR 69 | Ht 67.0 in | Wt 171.2 lb

## 2023-02-03 DIAGNOSIS — I10 Essential (primary) hypertension: Secondary | ICD-10-CM

## 2023-02-03 DIAGNOSIS — I214 Non-ST elevation (NSTEMI) myocardial infarction: Secondary | ICD-10-CM | POA: Diagnosis not present

## 2023-02-03 DIAGNOSIS — E782 Mixed hyperlipidemia: Secondary | ICD-10-CM

## 2023-02-03 NOTE — Progress Notes (Signed)
Cardiology Office Note:  .   Date:  02/03/2023  ID:  Bryan Craig, DOB 1950-07-14, MRN [id number] PCP: Marisue Brooklyn  [facility] Providers Cardiologist:  Nanetta Batty, MD     History of Present Illness: .   Bryan Craig is a 72 y.o. male with PMH of CAD, hypertension, hyperlipidemia and prediabetes.  Patient had NSTEMI in March 2024 and underwent DES to ramus intermedius branch by Dr. Clifton James.  Echocardiogram in March 2024 showed EF 60 to 65%, grade 1 DD. He was placed on aspirin and Brilinta. He was last seen by Dr. Allyson Sabal for follow-up in May 2024 at which time he was doing well.  More recently, patient returned to the hospital on 01/24/2023 after presented to [facility] with chest pain.  EKG showed subtle lateral ST elevation.  Code STEMI was activated.  Cardiac catheterization performed on 01/24/2023 showed 99% ramus lesion treated with 2.75 x 12 mm Synergy DES, 30% ostial left circumflex lesion, 30% proximal RCA, 30% mid RCA, 50% proximal to mid left circumflex lesion, 60% mid LAD lesion.  Acute MI was in the setting of thrombotic subtotal occlusion of the proximal edge of the old stent.  Postprocedure, Aggrastat infusion was run for another 2 hours.  Brilinta was switched to Effient.  If he has angina in the future, may consider pressure wire analysis of LAD which was moderate.  Echocardiogram obtained 01/26/2023 showed EF 45%, akinesis of basal to mid inferolateral and hypokinesis of basal to mid inferior wall, grade 1 DD, mildly reduced RV EF, trivial MR. It was recommended for him to continue aspirin and Effient indefinitely given the unknown mechanism behind stent thrombosis this time.  Patient presents today for follow-up accompanied by daughter.  Since discharge, he has not had any recurrent chest discomfort or shortness of breath.  He has been compliant with his medication.  Recent LDL was 71.  Ideally given the recent MI and the recurrent stenosis, we should aim  for LDL goal of less than 55.  I will refer the patient to the lipid clinic to consider PCSK9 inhibitor.  Otherwise he is doing well and can follow-up with Dr. Allyson Sabal in 3 to 4 months.    ROS:   He denies chest pain, palpitations, dyspnea, pnd, orthopnea, n, v, dizziness, syncope, edema, weight gain, or early satiety. All other systems reviewed and are otherwise negative except as noted above.    Studies Reviewed: Marland Kitchen   EKG Interpretation Date/Time:  Wednesday February 03 2023 10:47:48 EDT Ventricular Rate:  58 PR Interval:  156 QRS Duration:  88 QT Interval:  390 QTC Calculation: 382 R Axis:   41  Text Interpretation: Sinus bradycardia  No significant ST-T wave changes Confirmed by Azalee Course [phone]) on 02/03/2023 1:10:27 PM    Cardiac Studies & Procedures   CARDIAC CATHETERIZATION  CARDIAC CATHETERIZATION 01/24/2023  Narrative   Ost Cx to Prox Cx lesion is 30% stenosed.   Prox RCA lesion is 30% stenosed.   Mid RCA lesion is 30% stenosed.   Prox Cx to Mid Cx lesion is 50% stenosed.   Ramus lesion is 99% stenosed.   Mid LAD lesion is 60% stenosed.   A drug-eluting stent was successfully placed using a SYNERGY XD 2.75X12.   Post intervention, there is a 0% residual stenosis.  Acute lateral STEMI secondary to thrombotic sub-total occlusion on the proximal edge of the old stent Successful PTCA/DES x 1 proximal segment of the ramus intermediate branch overlapping with the  Cardiology Office Note:  .   Date:  02/03/2023  ID:  Bryan Craig, DOB 1950-07-14, MRN [id number] PCP: Marisue Brooklyn  [facility] Providers Cardiologist:  Nanetta Batty, MD     History of Present Illness: .   Bryan Craig is a 72 y.o. male with PMH of CAD, hypertension, hyperlipidemia and prediabetes.  Patient had NSTEMI in March 2024 and underwent DES to ramus intermedius branch by Dr. Clifton James.  Echocardiogram in March 2024 showed EF 60 to 65%, grade 1 DD. He was placed on aspirin and Brilinta. He was last seen by Dr. Allyson Sabal for follow-up in May 2024 at which time he was doing well.  More recently, patient returned to the hospital on 01/24/2023 after presented to [facility] with chest pain.  EKG showed subtle lateral ST elevation.  Code STEMI was activated.  Cardiac catheterization performed on 01/24/2023 showed 99% ramus lesion treated with 2.75 x 12 mm Synergy DES, 30% ostial left circumflex lesion, 30% proximal RCA, 30% mid RCA, 50% proximal to mid left circumflex lesion, 60% mid LAD lesion.  Acute MI was in the setting of thrombotic subtotal occlusion of the proximal edge of the old stent.  Postprocedure, Aggrastat infusion was run for another 2 hours.  Brilinta was switched to Effient.  If he has angina in the future, may consider pressure wire analysis of LAD which was moderate.  Echocardiogram obtained 01/26/2023 showed EF 45%, akinesis of basal to mid inferolateral and hypokinesis of basal to mid inferior wall, grade 1 DD, mildly reduced RV EF, trivial MR. It was recommended for him to continue aspirin and Effient indefinitely given the unknown mechanism behind stent thrombosis this time.  Patient presents today for follow-up accompanied by daughter.  Since discharge, he has not had any recurrent chest discomfort or shortness of breath.  He has been compliant with his medication.  Recent LDL was 71.  Ideally given the recent MI and the recurrent stenosis, we should aim  for LDL goal of less than 55.  I will refer the patient to the lipid clinic to consider PCSK9 inhibitor.  Otherwise he is doing well and can follow-up with Dr. Allyson Sabal in 3 to 4 months.    ROS:   He denies chest pain, palpitations, dyspnea, pnd, orthopnea, n, v, dizziness, syncope, edema, weight gain, or early satiety. All other systems reviewed and are otherwise negative except as noted above.    Studies Reviewed: Marland Kitchen   EKG Interpretation Date/Time:  Wednesday February 03 2023 10:47:48 EDT Ventricular Rate:  58 PR Interval:  156 QRS Duration:  88 QT Interval:  390 QTC Calculation: 382 R Axis:   41  Text Interpretation: Sinus bradycardia  No significant ST-T wave changes Confirmed by Azalee Course [phone]) on 02/03/2023 1:10:27 PM    Cardiac Studies & Procedures   CARDIAC CATHETERIZATION  CARDIAC CATHETERIZATION 01/24/2023  Narrative   Ost Cx to Prox Cx lesion is 30% stenosed.   Prox RCA lesion is 30% stenosed.   Mid RCA lesion is 30% stenosed.   Prox Cx to Mid Cx lesion is 50% stenosed.   Ramus lesion is 99% stenosed.   Mid LAD lesion is 60% stenosed.   A drug-eluting stent was successfully placed using a SYNERGY XD 2.75X12.   Post intervention, there is a 0% residual stenosis.  Acute lateral STEMI secondary to thrombotic sub-total occlusion on the proximal edge of the old stent Successful PTCA/DES x 1 proximal segment of the ramus intermediate branch overlapping with the  SYNERGY XD [id number]. Stent strut is well apposed. Post-stent angioplasty was performed using a BALL SAPPHIRE NC24 2.75X26. Post-Intervention Lesion Assessment The intervention was successful. Pre-interventional TIMI flow is 1. Post-intervention TIMI flow is 3. No complications occurred at this lesion. There is a 0% residual stenosis post intervention.     ECHOCARDIOGRAM  ECHOCARDIOGRAM COMPLETE 01/26/2023  Narrative ECHOCARDIOGRAM REPORT    Patient Name:   Bryan Craig Date of Exam: 01/26/2023 Medical Rec #:  [id number]     Height:       67.0 in Accession #:    [id number]    Weight:       175.0 lb Date of Birth:  26-Sep-1950     BSA:          1.911 m Patient Age:    72 years      BP:           110/69 mmHg Patient Gender: M             HR:           57 bpm. Exam Location:   Inpatient  Procedure: 2D Echo, Cardiac Doppler and Color Doppler  Indications:    MI  History:        Patient has prior history of Echocardiogram examinations, most recent 07/23/2022. Previous Myocardial Infarction, Signs/Symptoms:Chest Pain; Risk Factors:Hypertension and Dyslipidemia.  Sonographer:    Melissa Morford RDCS (AE, PE) Referring Phys: [phone] LINDSAY B ROBERTS  IMPRESSIONS   1. Left ventricular ejection fraction, by estimation, is 45%. The left ventricle has midlly decreased function. The left ventricle demonstrates regional wall motion abnormalities with basal to mid inferolateral akinesis and basal to mid inferior hypokinesis. There is mild concentric left ventricular hypertrophy. Left ventricular diastolic parameters are consistent with Grade I diastolic dysfunction (impaired relaxation). 2. Peak RV-RA gradient 15 mmHg. Right ventricular systolic function is mildly reduced. The right ventricular size is normal. 3. The mitral valve is normal in structure. Trivial mitral valve regurgitation. No evidence of mitral stenosis. 4. The aortic valve is tricuspid. There is mild calcification of the aortic valve. Aortic valve regurgitation is not visualized. No aortic stenosis is present. 5. The IVC was not visualized.  FINDINGS Left Ventricle: Left ventricular ejection fraction, by estimation, is 45%. The left ventricle has mildly decreased function. The left ventricle demonstrates regional wall motion abnormalities. Definity contrast agent was given IV to delineate the left ventricular endocardial borders. The left ventricular internal cavity size was normal in size. There is mild concentric left ventricular hypertrophy. Left ventricular diastolic parameters are consistent with Grade I diastolic dysfunction (impaired relaxation).  Right Ventricle: Peak RV-RA gradient 15 mmHg. The right ventricular size is normal. No increase in right ventricular wall thickness. Right ventricular  systolic function is mildly reduced.  Left Atrium: Left atrial size was normal in size.  Right Atrium: Right atrial size was normal in size.  Pericardium: There is no evidence of pericardial effusion.  Mitral Valve: The mitral valve is normal in structure. Trivial mitral valve regurgitation. No evidence of mitral valve stenosis.  Tricuspid Valve: The tricuspid valve is normal in structure. Tricuspid valve regurgitation is trivial.  Aortic Valve: The aortic valve is tricuspid. There is mild calcification of the aortic valve. Aortic valve regurgitation is not visualized. No aortic stenosis is present.  Pulmonic Valve: The pulmonic valve was normal in structure. Pulmonic valve regurgitation is not visualized.  Aorta: The aortic root is normal in size and structure.  Venous: The IVC was not visualized.  SYNERGY XD [id number]. Stent strut is well apposed. Post-stent angioplasty was performed using a BALL SAPPHIRE NC24 2.75X26. Post-Intervention Lesion Assessment The intervention was successful. Pre-interventional TIMI flow is 1. Post-intervention TIMI flow is 3. No complications occurred at this lesion. There is a 0% residual stenosis post intervention.     ECHOCARDIOGRAM  ECHOCARDIOGRAM COMPLETE 01/26/2023  Narrative ECHOCARDIOGRAM REPORT    Patient Name:   Bryan Craig Date of Exam: 01/26/2023 Medical Rec #:  [id number]     Height:       67.0 in Accession #:    [id number]    Weight:       175.0 lb Date of Birth:  26-Sep-1950     BSA:          1.911 m Patient Age:    72 years      BP:           110/69 mmHg Patient Gender: M             HR:           57 bpm. Exam Location:   Inpatient  Procedure: 2D Echo, Cardiac Doppler and Color Doppler  Indications:    MI  History:        Patient has prior history of Echocardiogram examinations, most recent 07/23/2022. Previous Myocardial Infarction, Signs/Symptoms:Chest Pain; Risk Factors:Hypertension and Dyslipidemia.  Sonographer:    Melissa Morford RDCS (AE, PE) Referring Phys: [phone] LINDSAY B ROBERTS  IMPRESSIONS   1. Left ventricular ejection fraction, by estimation, is 45%. The left ventricle has midlly decreased function. The left ventricle demonstrates regional wall motion abnormalities with basal to mid inferolateral akinesis and basal to mid inferior hypokinesis. There is mild concentric left ventricular hypertrophy. Left ventricular diastolic parameters are consistent with Grade I diastolic dysfunction (impaired relaxation). 2. Peak RV-RA gradient 15 mmHg. Right ventricular systolic function is mildly reduced. The right ventricular size is normal. 3. The mitral valve is normal in structure. Trivial mitral valve regurgitation. No evidence of mitral stenosis. 4. The aortic valve is tricuspid. There is mild calcification of the aortic valve. Aortic valve regurgitation is not visualized. No aortic stenosis is present. 5. The IVC was not visualized.  FINDINGS Left Ventricle: Left ventricular ejection fraction, by estimation, is 45%. The left ventricle has mildly decreased function. The left ventricle demonstrates regional wall motion abnormalities. Definity contrast agent was given IV to delineate the left ventricular endocardial borders. The left ventricular internal cavity size was normal in size. There is mild concentric left ventricular hypertrophy. Left ventricular diastolic parameters are consistent with Grade I diastolic dysfunction (impaired relaxation).  Right Ventricle: Peak RV-RA gradient 15 mmHg. The right ventricular size is normal. No increase in right ventricular wall thickness. Right ventricular  systolic function is mildly reduced.  Left Atrium: Left atrial size was normal in size.  Right Atrium: Right atrial size was normal in size.  Pericardium: There is no evidence of pericardial effusion.  Mitral Valve: The mitral valve is normal in structure. Trivial mitral valve regurgitation. No evidence of mitral valve stenosis.  Tricuspid Valve: The tricuspid valve is normal in structure. Tricuspid valve regurgitation is trivial.  Aortic Valve: The aortic valve is tricuspid. There is mild calcification of the aortic valve. Aortic valve regurgitation is not visualized. No aortic stenosis is present.  Pulmonic Valve: The pulmonic valve was normal in structure. Pulmonic valve regurgitation is not visualized.  Aorta: The aortic root is normal in size and structure.  Venous: The IVC was not visualized.  SYNERGY XD [id number]. Stent strut is well apposed. Post-stent angioplasty was performed using a BALL SAPPHIRE NC24 2.75X26. Post-Intervention Lesion Assessment The intervention was successful. Pre-interventional TIMI flow is 1. Post-intervention TIMI flow is 3. No complications occurred at this lesion. There is a 0% residual stenosis post intervention.     ECHOCARDIOGRAM  ECHOCARDIOGRAM COMPLETE 01/26/2023  Narrative ECHOCARDIOGRAM REPORT    Patient Name:   Bryan Craig Date of Exam: 01/26/2023 Medical Rec #:  [id number]     Height:       67.0 in Accession #:    [id number]    Weight:       175.0 lb Date of Birth:  26-Sep-1950     BSA:          1.911 m Patient Age:    72 years      BP:           110/69 mmHg Patient Gender: M             HR:           57 bpm. Exam Location:   Inpatient  Procedure: 2D Echo, Cardiac Doppler and Color Doppler  Indications:    MI  History:        Patient has prior history of Echocardiogram examinations, most recent 07/23/2022. Previous Myocardial Infarction, Signs/Symptoms:Chest Pain; Risk Factors:Hypertension and Dyslipidemia.  Sonographer:    Melissa Morford RDCS (AE, PE) Referring Phys: [phone] LINDSAY B ROBERTS  IMPRESSIONS   1. Left ventricular ejection fraction, by estimation, is 45%. The left ventricle has midlly decreased function. The left ventricle demonstrates regional wall motion abnormalities with basal to mid inferolateral akinesis and basal to mid inferior hypokinesis. There is mild concentric left ventricular hypertrophy. Left ventricular diastolic parameters are consistent with Grade I diastolic dysfunction (impaired relaxation). 2. Peak RV-RA gradient 15 mmHg. Right ventricular systolic function is mildly reduced. The right ventricular size is normal. 3. The mitral valve is normal in structure. Trivial mitral valve regurgitation. No evidence of mitral stenosis. 4. The aortic valve is tricuspid. There is mild calcification of the aortic valve. Aortic valve regurgitation is not visualized. No aortic stenosis is present. 5. The IVC was not visualized.  FINDINGS Left Ventricle: Left ventricular ejection fraction, by estimation, is 45%. The left ventricle has mildly decreased function. The left ventricle demonstrates regional wall motion abnormalities. Definity contrast agent was given IV to delineate the left ventricular endocardial borders. The left ventricular internal cavity size was normal in size. There is mild concentric left ventricular hypertrophy. Left ventricular diastolic parameters are consistent with Grade I diastolic dysfunction (impaired relaxation).  Right Ventricle: Peak RV-RA gradient 15 mmHg. The right ventricular size is normal. No increase in right ventricular wall thickness. Right ventricular  systolic function is mildly reduced.  Left Atrium: Left atrial size was normal in size.  Right Atrium: Right atrial size was normal in size.  Pericardium: There is no evidence of pericardial effusion.  Mitral Valve: The mitral valve is normal in structure. Trivial mitral valve regurgitation. No evidence of mitral valve stenosis.  Tricuspid Valve: The tricuspid valve is normal in structure. Tricuspid valve regurgitation is trivial.  Aortic Valve: The aortic valve is tricuspid. There is mild calcification of the aortic valve. Aortic valve regurgitation is not visualized. No aortic stenosis is present.  Pulmonic Valve: The pulmonic valve was normal in structure. Pulmonic valve regurgitation is not visualized.  Aorta: The aortic root is normal in size and structure.  Venous: The IVC was not visualized.

## 2023-02-03 NOTE — Telephone Encounter (Signed)
Attempted to call patient in regards to Cardiac Rehab - LM on VM Mailed letter 

## 2023-02-03 NOTE — Patient Instructions (Addendum)
Medication Instructions:  NO CHANGES *If you need a refill on your cardiac medications before your next appointment, please call your pharmacy*   Lab Work: NO LABS If you have labs (blood work) drawn today and your tests are completely normal, you will receive your results only by: MyChart Message (if you have MyChart) OR A paper copy in the mail If you have any lab test that is abnormal or we need to change your treatment, we will call you to review the results.   Testing/Procedures: NO TESTING   Follow-Up: At [facility], you and your health needs are our priority.  As part of our continuing mission to provide you with exceptional heart care, we have created designated Provider Care Teams.  These Care Teams include your primary Cardiologist (physician) and Advanced Practice Providers (APPs -  Physician Assistants and Nurse Practitioners) who all work together to provide you with the care you need, when you need it.    Your next appointment:   3-4 month(s)  Provider:   Nanetta Batty, MD   Other Instructions REFERRAL TO PHARM D LIPID CLINIC

## 2023-02-22 ENCOUNTER — Other Ambulatory Visit (HOSPITAL_COMMUNITY): Payer: Self-pay

## 2023-02-22 ENCOUNTER — Telehealth (HOSPITAL_COMMUNITY): Payer: Self-pay

## 2023-02-22 ENCOUNTER — Other Ambulatory Visit: Payer: Self-pay | Admitting: Medical

## 2023-02-22 NOTE — Telephone Encounter (Signed)
No response from pt in regards to cardiac rehab. Closed referral 

## 2023-02-23 ENCOUNTER — Other Ambulatory Visit (HOSPITAL_COMMUNITY): Payer: Self-pay

## 2023-02-25 ENCOUNTER — Ambulatory Visit: Payer: Medicare Other | Attending: Cardiology

## 2023-02-25 NOTE — Progress Notes (Deleted)
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 male patient of Dr Allyson Sabal, in the office today to discuss options for cholesterol management.  Patient was most recently seen by Azalee Course PA and noted to have LDL at 71.  Because of his history, LDL goal is < 55.  Currently on high intensity statin as well as ezetimibe, *** concern with compliance  Insurance Carrier:   LDL Cholesterol goal:  LDL < 55  Current Medications:   atorvastatin 80, ezetimibe 10  Previously tried:    Family Hx:     Social Hx: Tobacco: Alcohol:      Diet:      Exercise:   Adherence Assessment  Do you ever forget to take your medication? [] Yes [] No  Do you ever skip doses due to side effects? [] Yes [] No  Do you have trouble affording your medicines? [] Yes [] No  Are you ever unable to pick up your medication due to transportation difficulties? [] Yes [] No  Do you ever stop taking your medications because you don't believe they are helping? [] Yes [] No  Do you check your weight daily? [] Yes [] No   Adherence strategy: ***  Barriers to obtaining medications: ***     Accessory Clinical Findings   Lab Results  Component Value Date   CHOL 124 01/24/2023   HDL 37 (L) 01/24/2023   LDLCALC 71 01/24/2023   LDLDIRECT 131.0 07/21/2022   TRIG 79 01/24/2023   CHOLHDL  3.4 01/24/2023    Lipoprotein (a)  Date/Time Value Ref Range Status  07/22/2022 02:17 AM 256.8 (H) <75.0 nmol/L Final    Comment:    (NOTE) **Results verified by repeat testing** Note:  Values greater than or equal to 75.0 nmol/L may       indicate an independent risk factor for CHD,       but must be evaluated with caution when applied       to non-Caucasian populations due to the       influence of genetic factors on Lp(a) across       ethnicities. Performed At: [facility] [address] [place], [place] [id number] Jolene Schimke MD NW:[id number]     Lab Results  Component Value Date   ALT 62 (H) 12/07/2022   AST 47 (H) 12/07/2022   ALKPHOS 127 (H) 12/07/2022   BILITOT 0.7 12/07/2022   Lab Results  Component Value Date   CREATININE 0.97 01/26/2023   BUN 12 01/26/2023   NA 135 01/26/2023   K 3.8 01/26/2023   CL 104 01/26/2023   CO2 26 01/26/2023   Lab Results  Component Value Date   HGBA1C 6.1 (H) 01/24/2023    Home Medications    Current Outpatient Medications  Medication Sig Dispense  Refill   aspirin EC 81 MG tablet Take 1 tablet (81 mg total) by mouth daily. Swallow whole. 90 tablet 3   atorvastatin (LIPITOR) 80 MG tablet Take 1 tablet (80 mg total) by mouth at bedtime. (Patient taking differently: Take 80 mg by mouth daily.) 90 tablet 1   ezetimibe (ZETIA) 10 MG tablet Take 1 tablet (10 mg total) by mouth daily. 90 tablet 3   metoprolol succinate (TOPROL-XL) 50 MG 24 hr tablet TAKE 1 TABLET BY MOUTH EVERY DAY 90 tablet 3   nitroGLYCERIN (NITROSTAT) 0.4 MG SL tablet Place 1 tablet (0.4 mg total) under the tongue every 5 (five) minutes as needed for chest pain (CP or SOB). 25 tablet 2   prasugrel (EFFIENT) 10 MG TABS tablet Take 1 tablet (10 mg total) by mouth daily. 90 tablet 3   prednisoLONE acetate (PRED FORTE) 1 % ophthalmic suspension Place 1 drop into the left eye daily.     Risankizumab-rzaa (SKYRIZI) 150 MG/ML SOSY Inject into the skin every 3  (three) months.     telmisartan (MICARDIS) 80 MG tablet TAKE 1 TABLET BY MOUTH EVERY DAY 90 tablet 3   triamcinolone cream (KENALOG) 0.1 % Apply 1 Application topically 2 (two) times daily. (Patient taking differently: Apply 1 Application topically 2 (two) times daily as needed (irritation).) 30 g 0   No current facility-administered medications for this visit.     Assessment & Plan    No problem-specific Assessment & Plan notes found for this encounter.   Phillips Hay, PharmD CPP [facility] [address]  [place], [place] [address] [phone]  02/25/2023, 7:44 AM

## 2023-03-17 ENCOUNTER — Encounter: Payer: Self-pay | Admitting: Cardiovascular Disease

## 2023-03-23 ENCOUNTER — Other Ambulatory Visit: Payer: Self-pay

## 2023-03-23 ENCOUNTER — Other Ambulatory Visit (HOSPITAL_COMMUNITY): Payer: Self-pay

## 2023-03-24 ENCOUNTER — Other Ambulatory Visit (HOSPITAL_COMMUNITY): Payer: Self-pay

## 2023-04-15 ENCOUNTER — Other Ambulatory Visit (HOSPITAL_COMMUNITY): Payer: Self-pay

## 2023-04-22 ENCOUNTER — Other Ambulatory Visit: Payer: Self-pay

## 2023-04-23 ENCOUNTER — Other Ambulatory Visit (HOSPITAL_COMMUNITY): Payer: Self-pay

## 2023-05-11 ENCOUNTER — Encounter: Payer: Self-pay | Admitting: Cardiovascular Disease

## 2023-05-11 ENCOUNTER — Ambulatory Visit: Payer: Medicare Other | Attending: Cardiovascular Disease | Admitting: Cardiovascular Disease

## 2023-05-11 VITALS — BP 118/84 | HR 74 | Wt 173.0 lb

## 2023-05-11 DIAGNOSIS — I10 Essential (primary) hypertension: Secondary | ICD-10-CM

## 2023-05-11 DIAGNOSIS — E782 Mixed hyperlipidemia: Secondary | ICD-10-CM | POA: Diagnosis not present

## 2023-05-11 DIAGNOSIS — I519 Heart disease, unspecified: Secondary | ICD-10-CM | POA: Diagnosis not present

## 2023-05-11 NOTE — Assessment & Plan Note (Signed)
 History of essential hypertension her blood pressure measured today at 118/64.  He is on metoprolol and Micardis.

## 2023-05-11 NOTE — Progress Notes (Signed)
 05/11/2023 [name]   Sep 14, 1950  [id number]  Primary Physician [place], [place] RIGGERS Primary Cardiologist: Dorn JINNY Lesches MD [facility] [place], [place]  HPI:  Bryan Craig is a 73 y.o.  thin-appearing married Latino male originally from [place] who [place] when he came over at age 75. He is a father of 3 daughters but has no grandchildren yet. He is retired from being in the equities trader.  I last saw him in the office 09/04/2022.  He was originally seen by myself when he presented with chest pain and ultimately had a non-STEMI 07/22/2022. The following day he underwent PCI drug-eluting stenting of his ramus branch by Dr. Verlin. His risk factors include family history with father and brother both who who had ischemic heart disease, treated hypertension and hyperlipidemia. He has had no recurrent symptoms. He remains on DAPT with aspirin  and Brilinta . He is participating cardiac rehab.   He presented on 01/24/2023 with non-STEMI.  Catheterization revealed a subtotally occluded ramus branch stent was which was reintervened on and a 60% proximal LAD which did not appear to be physiologically significant.  His LV function was 45%, decreased from normal at the time of his original intervention in March.  Was discharged home on Effient  instead of Brilinta .  He has remained asymptomatic.   Current Meds  Medication Sig   aspirin  EC 81 MG tablet Take 1 tablet (81 mg total) by mouth daily. Swallow whole.   atorvastatin  (LIPITOR) 80 MG tablet Take 1 tablet (80 mg total) by mouth at bedtime. (Patient taking differently: Take 80 mg by mouth daily.)   ezetimibe  (ZETIA ) 10 MG tablet Take 1 tablet (10 mg total) by mouth daily.   metoprolol  succinate (TOPROL -XL) 50 MG 24 hr tablet TAKE 1 TABLET BY MOUTH EVERY DAY   prasugrel  (EFFIENT ) 10 MG TABS tablet Take 1 tablet (10 mg total) by mouth daily.   prednisoLONE acetate (PRED FORTE) 1 % ophthalmic suspension Place  1 drop into the left eye daily.   Risankizumab-rzaa (SKYRIZI) 150 MG/ML SOSY Inject into the skin every 3 (three) months.   telmisartan  (MICARDIS ) 80 MG tablet TAKE 1 TABLET BY MOUTH EVERY DAY   triamcinolone  cream (KENALOG ) 0.1 % Apply 1 Application topically 2 (two) times daily. (Patient taking differently: Apply 1 Application topically 2 (two) times daily as needed (irritation).)     Allergies  Allergen Reactions   Lisinopril     REACTION: cough   Nitroglycerin      Blood pressure drop   Penicillins Rash    Social History   Socioeconomic History   Marital status: Married    Spouse name: Lonell   Number of children: 3   Years of education: Not on file   Highest education level: Not on file  Occupational History   Occupation: retired    Comment: civil service fast streamer  Tobacco Use   Smoking status: Never   Smokeless tobacco: Never  Vaping Use   Vaping status: Never Used  Substance and Sexual Activity   Alcohol use: Yes    Comment: 1 beer every 2-3 weeks   Drug use: Never   Sexual activity: Yes  Other Topics Concern   Not on file  Social History Narrative   Married, 2 daughters with current wife and 1 daughter from previous marriage   Social Drivers of Corporate Investment Banker Strain: Low Risk  (11/25/2022)   Overall Financial Resource Strain (CARDIA)    Difficulty  of Paying Living Expenses: Not very hard  Food Insecurity: No Food Insecurity (02/02/2023)   Hunger Vital Sign    Worried About Running Out of Food in the Last Year: Never true    Ran Out of Food in the Last Year: Never true  Transportation Needs: No Transportation Needs (02/02/2023)   PRAPARE - Administrator, Civil Service (Medical): No    Lack of Transportation (Non-Medical): No  Physical Activity: Sufficiently Active (11/25/2022)   Exercise Vital Sign    Days of Exercise per Week: 7 days    Minutes of Exercise per Session: 30 min  Stress: No Stress Concern Present (11/25/2022)    [employer] of Occupational Health - Occupational Stress Questionnaire    Feeling of Stress : Not at all  Social Connections: Moderately Isolated (11/25/2022)   Social Connection and Isolation Panel [NHANES]    Frequency of Communication with Friends and Family: More than three times a week    Frequency of Social Gatherings with Friends and Family: More than three times a week    Attends Religious Services: Never    Database Administrator or Organizations: No    Attends Banker Meetings: Never    Marital Status: Married  Catering Manager Violence: Not At Risk (01/24/2023)   Humiliation, Afraid, Rape, and Kick questionnaire    Fear of Current or Ex-Partner: No    Emotionally Abused: No    Physically Abused: No    Sexually Abused: No     Review of Systems: General: negative for chills, fever, night sweats or weight changes.  Cardiovascular: negative for chest pain, dyspnea on exertion, edema, orthopnea, palpitations, paroxysmal nocturnal dyspnea or shortness of breath Dermatological: negative for rash Respiratory: negative for cough or wheezing Urologic: negative for hematuria Abdominal: negative for nausea, vomiting, diarrhea, bright red blood per rectum, melena, or hematemesis Neurologic: negative for visual changes, syncope, or dizziness All other systems reviewed and are otherwise negative except as noted above.    Blood pressure 118/84, pulse 74, weight 173 lb (78.5 kg), SpO2 96%.  General appearance: alert and no distress Neck: no adenopathy, no carotid bruit, no JVD, supple, symmetrical, trachea midline, and thyroid  not enlarged, symmetric, no tenderness/mass/nodules Lungs: clear to auscultation bilaterally Heart: regular rate and rhythm, S1, S2 normal, no murmur, click, rub or gallop Extremities: extremities normal, atraumatic, no cyanosis or edema Pulses: 2+ and symmetric Skin: Skin color, texture, turgor normal. No rashes or lesions Neurologic: Grossly  normal  EKG not performed today      ASSESSMENT AND PLAN:   Hyperlipidemia History of hyperlipidemia on high-dose atorvastatin  and Zetia  with lipid profile performed 01/24/2023 revealing a total cholesterol of 124, LDL 71 and HDL of 37.  Hypertension History of essential hypertension her blood pressure measured today at 118/64.  He is on metoprolol  and Micardis .  NSTEMI (non-ST elevated myocardial infarction) (HCC) History of CAD status post non-STEMI 07/22/2022.  The following day he underwent cardiac catheterization, PCI and stenting of the ramus branch by Dr. Verlin.  He was discharged home on DAPT with aspirin  and Brilinta .  He presented again on 01/24/2023 with non-STEMI.  His ramus branch stent was occluded and he underwent reintervention.  He did have a 60% proximal LAD stenosis that did not appear physiologically significant.  He was discharged home on aspirin  and Effient .  He has had no recurrent symptoms.  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  This is reduced from his previous EF of 60% at the time of his first STEMI.  I suspect this is related to ischemic cardiomyopathy.  He is he is on GDMT and is fairly asymptomatic.  We will recheck a 2D echo     Dorn DOROTHA Lesches MD [facility], [facility] 05/11/2023 11:19 AM

## 2023-05-11 NOTE — Assessment & Plan Note (Signed)
 History of hyperlipidemia on high-dose atorvastatin and Zetia with lipid profile performed 01/24/2023 revealing a total cholesterol of 124, LDL 71 and HDL of 37.

## 2023-05-11 NOTE — Assessment & Plan Note (Signed)
 EF by 2D echo performed 01/26/2023 was 45%.  This is reduced from his previous EF of 60% at the time of his first STEMI.  I suspect this is related to "ischemic cardiomyopathy.  He is he is on GDMT and is fairly asymptomatic.  We will recheck a 2D echo

## 2023-05-11 NOTE — Assessment & Plan Note (Signed)
 History of CAD status post non-STEMI 07/22/2022.  The following day he underwent cardiac catheterization, PCI and stenting of the ramus branch by Dr. Verlin.  He was discharged home on DAPT with aspirin  and Brilinta .  He presented again on 01/24/2023 with non-STEMI.  His ramus branch stent was occluded and he underwent reintervention.  He did have a 60% proximal LAD stenosis that did not appear physiologically significant.  He was discharged home on aspirin  and Effient .  He has had no recurrent symptoms.

## 2023-05-11 NOTE — Patient Instructions (Addendum)
 Medication Instructions:  Your physician recommends that you continue on your current medications as directed. Please refer to the Current Medication list given to you today.  *If you need a refill on your cardiac medications before your next appointment, please call your pharmacy*   Testing/Procedures: Your physician has requested that you have an echocardiogram. Echocardiography is a painless test that uses sound waves to create images of your heart. It provides your doctor with information about the size and shape of your heart and how well your heart's chambers and valves are working. This procedure takes approximately one hour. There are no restrictions for this procedure. Please do NOT wear cologne, perfume, aftershave, or lotions (deodorant is allowed). Please arrive 15 minutes prior to your appointment time. This will take place at [address] [place]  Please note: We ask at that you not bring children with you during ultrasound (echo/ vascular) testing. Due to room size and safety concerns, children are not allowed in the ultrasound rooms during exams. Our front office staff cannot provide observation of children in our lobby area while testing is being conducted. An adult accompanying a patient to their appointment will only be allowed in the ultrasound room at the discretion of the ultrasound technician under special circumstances. We apologize for any inconvenience.    Follow-Up: At [facility], you and your health needs are our priority.  As part of our continuing mission to provide you with exceptional heart care, we have created designated Provider Care Teams.  These Care Teams include your primary Cardiologist (physician) and Advanced Practice Providers (APPs -  Physician Assistants and Nurse Practitioners) who all work together to provide you with the care you need, when you need it.  We recommend signing up for the patient portal called MyChart.  Sign up  information is provided on this After Visit Summary.  MyChart is used to connect with patients for Virtual Visits (Telemedicine).  Patients are able to view lab/test results, encounter notes, upcoming appointments, etc.  Non-urgent messages can be sent to your provider as well.   To learn more about what you can do with MyChart, go to [email or web address].    Your next appointment:   12 month(s)  Provider:   Dorn Lesches, MD     Other Instructions

## 2023-06-15 ENCOUNTER — Ambulatory Visit (HOSPITAL_COMMUNITY)
Admission: RE | Admit: 2023-06-15 | Discharge: 2023-06-15 | Disposition: A | Discharge status: Home / Self Care | Payer: Medicare Other | Source: Ambulatory Visit | Attending: Cardiovascular Disease | Admitting: Cardiovascular Disease

## 2023-06-15 DIAGNOSIS — I519 Heart disease, unspecified: Secondary | ICD-10-CM

## 2023-06-15 DIAGNOSIS — I10 Essential (primary) hypertension: Secondary | ICD-10-CM

## 2023-06-15 DIAGNOSIS — E782 Mixed hyperlipidemia: Secondary | ICD-10-CM | POA: Diagnosis not present

## 2023-06-15 LAB — ECHOCARDIOGRAM COMPLETE
AR max vel: 2.42 cm2
AV Area VTI: 2.18 cm2
AV Area mean vel: 2.02 cm2
AV Mean grad: 5 mm[Hg]
AV Peak grad: 9 mm[Hg]
Ao pk vel: 1.5 m/s
Area-P 1/2: 3.24 cm2
Calc EF: 50.7 %
S' Lateral: 3.31 cm
Single Plane A2C EF: 54.4 %
Single Plane A4C EF: 47.8 %

## 2023-06-21 ENCOUNTER — Other Ambulatory Visit (HOSPITAL_COMMUNITY): Payer: Self-pay

## 2023-06-22 ENCOUNTER — Other Ambulatory Visit (HOSPITAL_COMMUNITY): Payer: Self-pay

## 2023-07-01 DIAGNOSIS — H2012 Chronic iridocyclitis, left eye: Secondary | ICD-10-CM | POA: Diagnosis not present

## 2023-07-01 DIAGNOSIS — H3581 Retinal edema: Secondary | ICD-10-CM | POA: Diagnosis not present

## 2023-07-01 DIAGNOSIS — H43811 Vitreous degeneration, right eye: Secondary | ICD-10-CM | POA: Diagnosis not present

## 2023-07-01 DIAGNOSIS — H35372 Puckering of macula, left eye: Secondary | ICD-10-CM | POA: Diagnosis not present

## 2023-07-14 ENCOUNTER — Other Ambulatory Visit: Payer: Self-pay | Admitting: Medical

## 2023-07-14 ENCOUNTER — Other Ambulatory Visit (HOSPITAL_COMMUNITY): Payer: Self-pay

## 2023-07-31 ENCOUNTER — Other Ambulatory Visit: Payer: Self-pay | Admitting: Cardiovascular Disease

## 2023-07-31 ENCOUNTER — Other Ambulatory Visit (HOSPITAL_COMMUNITY): Payer: Self-pay

## 2023-07-31 ENCOUNTER — Other Ambulatory Visit: Payer: Self-pay | Admitting: Family Medicine

## 2023-07-31 DIAGNOSIS — I10 Essential (primary) hypertension: Secondary | ICD-10-CM

## 2023-08-14 ENCOUNTER — Other Ambulatory Visit (HOSPITAL_COMMUNITY): Payer: Self-pay

## 2023-09-07 DIAGNOSIS — H2511 Age-related nuclear cataract, right eye: Secondary | ICD-10-CM | POA: Diagnosis not present

## 2023-09-16 ENCOUNTER — Telehealth: Payer: Self-pay

## 2023-09-16 NOTE — Telephone Encounter (Signed)
 Copied from CRM [id number]. Topic: General - Other >> Sep 16, 2023  3:52 PM Dorisann Garre T wrote: Reason for CRM: jody wants to know if the patient is on any staten medication jody [facility] nurse [phone]

## 2023-09-16 NOTE — Telephone Encounter (Signed)
 See med list, Pt already on a statin.

## 2023-10-01 ENCOUNTER — Encounter: Payer: Self-pay | Admitting: Medical

## 2023-10-28 ENCOUNTER — Other Ambulatory Visit: Payer: Self-pay | Admitting: Medical

## 2023-11-17 DIAGNOSIS — L408 Other psoriasis: Secondary | ICD-10-CM | POA: Diagnosis not present

## 2023-11-23 ENCOUNTER — Other Ambulatory Visit: Payer: Self-pay | Admitting: Medical

## 2023-11-30 ENCOUNTER — Ambulatory Visit: Payer: Medicare Other | Admitting: *Deleted

## 2023-11-30 VITALS — Ht 64.0 in | Wt 156.0 lb

## 2023-11-30 DIAGNOSIS — Z0000 Encounter for general adult medical examination without abnormal findings: Secondary | ICD-10-CM | POA: Diagnosis not present

## 2023-11-30 NOTE — Patient Instructions (Signed)
 Bryan Craig , Thank you for taking time out of your busy schedule to complete your Annual Wellness Visit with me. I enjoyed our conversation and look forward to speaking with you again next year. I, as well as your care team,  appreciate your ongoing commitment to your health goals. Please review the following plan we discussed and let me know if I can assist you in the future. Your Game plan/ To Do List     Follow up Visits: Next Medicare AWV with our clinical staff:   11/30/24 10:20am  Next Office Visit with your provider: PLEASE CALL TO SCHEDULE A FOLLOW UP WITH EDWARD WHEN YOU RETURN FROM YOUR TRIP IN 1-2 MONTHS.  Clinician Recommendations:  Aim for 30 minutes of exercise or brisk walking, 6-8 glasses of water, and 5 servings of fruits and vegetables each day.   You will need to get the following vaccines at your local pharmacy (IF YOU CHANGE YOUR MIND):     FLU, SHINGLES, COVID.  This is a list of the screening recommended for you and due dates:  Health Maintenance  Topic Date Due   Zoster (Shingles) Vaccine (1 of 2) Never done   COVID-19 Vaccine (3 - Pfizer risk series) 09/25/2019   Medicare Annual Wellness Visit  11/25/2023   Flu Shot  11/26/2023   Pneumococcal Vaccine for age over 23 (2 of 2 - PPSV23, PCV20, or PCV21) 11/30/2023*   Colon Cancer Screening  11/30/2023*   DTaP/Tdap/Td vaccine (3 - Td or Tdap) 11/19/2031   Hepatitis C Screening  Completed   Hepatitis B Vaccine  Aged Out   HPV Vaccine  Aged Out   Meningitis B Vaccine  Aged Out  *Topic was postponed. The date shown is not the original due date.    Advanced directives: (Copy Requested) Please bring a copy of your health care power of attorney and living will to the office to be added to your chart at your convenience. You can mail to [facility] [address] [place], [place] [address] or email to [email or web address] Advance Care Planning is important because it:  [x]  Makes sure you receive  the medical care that is consistent with your values, goals, and preferences  [x]  It provides guidance to your family and loved ones and reduces their decisional burden about whether or not they are making the right decisions based on your wishes.  Follow the link provided in your after visit summary or read over the paperwork we have mailed to you to help you started getting your Advance Directives in place. If you need assistance in completing these, please reach out to us  so that we can help you!  See attachments for Preventive Care and Fall Prevention Tips.

## 2023-11-30 NOTE — Progress Notes (Signed)
 Subjective:   Bryan Craig is a 73 y.o. who presents for a Medicare Wellness preventive visit.  As a reminder, Annual Wellness Visits don't include a physical exam, and some assessments may be limited, especially if this visit is performed virtually. We may recommend an in-person follow-up visit with your provider if needed.  Visit Complete: Virtual I connected with  [name] on 11/30/23 by a audio enabled telemedicine application and verified that I am speaking with the correct person using two identifiers.  Patient Location: Home  Provider Location: Office/Clinic  I discussed the limitations of evaluation and management by telemedicine. The patient expressed understanding and agreed to proceed.  Vital Signs: Because this visit was a virtual/telehealth visit, some criteria may be missing or patient reported. Any vitals not documented were not able to be obtained and vitals that have been documented are patient reported.  VideoDeclined- This patient declined Librarian, academic. Therefore the visit was completed with audio only.  Persons Participating in Visit: Patient.  AWV Questionnaire: No: Patient Medicare AWV questionnaire was not completed prior to this visit.  Cardiac Risk Factors include: advanced age (>91men, >69 women);dyslipidemia;hypertension;Other (see comment), Risk factor comments: MI-acute heart failure     Objective:    Today's Vitals   11/30/23 1022  Weight: 156 lb (70.8 kg)  Height: 5' 4 (1.626 m)   Body mass index is 26.78 kg/m.     11/30/2023   10:37 AM 01/24/2023    4:30 PM 01/24/2023    2:32 PM 11/25/2022   10:47 AM 07/21/2022    4:58 PM 02/13/2022   10:03 AM 03/03/2021    9:21 AM  Advanced Directives  Does Patient Have a Medical Advance Directive? No No No No No No No  Would patient like information on creating a medical advance directive? No - Patient declined No - Patient declined  Yes (MAU/Ambulatory/Procedural  Areas - Information given) No - Patient declined  No - Patient declined    Current Medications (verified) Outpatient Encounter Medications as of 11/30/2023  Medication Sig   aspirin  EC 81 MG tablet Take 1 tablet (81 mg total) by mouth daily. Swallow whole.   atorvastatin  (LIPITOR) 80 MG tablet Take 1 tablet (80 mg total) by mouth at bedtime. (Patient taking differently: Take 80 mg by mouth daily.)   ezetimibe  (ZETIA ) 10 MG tablet TAKE 1 TABLET BY MOUTH EVERY DAY   metoprolol  succinate (TOPROL -XL) 50 MG 24 hr tablet TAKE 1 TABLET (50 MG TOTAL) BY MOUTH DAILY. NEEDS APPT   prasugrel  (EFFIENT ) 10 MG TABS tablet Take 1 tablet (10 mg total) by mouth daily.   prednisoLONE acetate (PRED FORTE) 1 % ophthalmic suspension Place 1 drop into the left eye daily.   Risankizumab-rzaa (SKYRIZI) 150 MG/ML SOSY Inject into the skin every 3 (three) months.   telmisartan  (MICARDIS ) 80 MG tablet TAKE 1 TABLET (80 MG TOTAL) BY MOUTH DAILY. NEEDS APPT   triamcinolone  cream (KENALOG ) 0.1 % Apply 1 Application topically 2 (two) times daily. (Patient taking differently: Apply 1 Application topically 2 (two) times daily as needed (irritation).)   [DISCONTINUED] nitroGLYCERIN  (NITROSTAT ) 0.4 MG SL tablet Place 1 tablet (0.4 mg total) under the tongue every 5 (five) minutes as needed for chest pain (CP or SOB). (Patient not taking: Reported on 11/30/2023)   No facility-administered encounter medications on file as of 11/30/2023.    Allergies (verified) Lisinopril, Nitroglycerin , and Penicillins   History: Past Medical History:  Diagnosis Date   CAD (coronary artery disease)  Hyperlipidemia    Hypertension    Kidney stone    Retinal detachment    Past Surgical History:  Procedure Laterality Date   CORONARY STENT INTERVENTION N/A 07/22/2022   Procedure: CORONARY STENT INTERVENTION;  Surgeon: Verlin Lonni BIRCH, MD;  Location: [facility];  Service: Cardiovascular;  Laterality: N/A;   CORONARY/GRAFT ACUTE  MI REVASCULARIZATION N/A 01/24/2023   Procedure: Coronary/Graft Acute MI Revascularization;  Surgeon: Verlin Lonni BIRCH, MD;  Location: [facility];  Service: Cardiovascular;  Laterality: N/A;   EYE SURGERY     LEFT HEART CATH AND CORONARY ANGIOGRAPHY N/A 07/22/2022   Procedure: LEFT HEART CATH AND CORONARY ANGIOGRAPHY;  Surgeon: Verlin Lonni BIRCH, MD;  Location: [facility];  Service: Cardiovascular;  Laterality: N/A;   LEFT HEART CATH AND CORONARY ANGIOGRAPHY N/A 01/24/2023   Procedure: LEFT HEART CATH AND CORONARY ANGIOGRAPHY;  Surgeon: Verlin Lonni BIRCH, MD;  Location: [facility];  Service: Cardiovascular;  Laterality: N/A;   Family History  Problem Relation Age of Onset   Hyperlipidemia Mother    Hypertension Mother    Heart disease Mother    Heart attack Father 53   Social History   Socioeconomic History   Marital status: Married    Spouse name: Lonell   Number of children: 3   Years of education: Not on file   Highest education level: Not on file  Occupational History   Occupation: retired    Comment: Civil Service fast streamer  Tobacco Use   Smoking status: Never   Smokeless tobacco: Never  Vaping Use   Vaping status: Never Used  Substance and Sexual Activity   Alcohol use: Yes    Comment: 1 beer every 2-3 weeks   Drug use: Never   Sexual activity: Yes  Other Topics Concern   Not on file  Social History Narrative   Married, 2 daughters with current wife and 1 daughter from previous marriage   Social Drivers of Corporate investment banker Strain: Low Risk  (11/30/2023)   Overall Financial Resource Strain (CARDIA)    Difficulty of Paying Living Expenses: Not hard at all  Food Insecurity: No Food Insecurity (11/30/2023)   Hunger Vital Sign    Worried About Running Out of Food in the Last Year: Never true    Ran Out of Food in the Last Year: Never true  Transportation Needs: No Transportation Needs (11/30/2023)   PRAPARE -  Administrator, Civil Service (Medical): No    Lack of Transportation (Non-Medical): No  Physical Activity: Sufficiently Active (11/30/2023)   Exercise Vital Sign    Days of Exercise per Week: 7 days    Minutes of Exercise per Session: 50 min  Stress: No Stress Concern Present (11/30/2023)   [employer] of Occupational Health - Occupational Stress Questionnaire    Feeling of Stress: Not at all  Social Connections: Unknown (11/30/2023)   Social Connection and Isolation Panel    Frequency of Communication with Friends and Family: More than three times a week    Frequency of Social Gatherings with Friends and Family: More than three times a week    Attends Religious Services: Not on Marketing executive or Organizations: No    Attends Banker Meetings: Never    Marital Status: Married    Tobacco Counseling Counseling given: Not Answered    Clinical Intake:  Pre-visit preparation completed: Yes  Pain : No/denies pain  BMI - recorded: 26.78 Nutritional Status: BMI 25 -29 Overweight Nutritional Risks: None Diabetes: No  Lab Results  Component Value Date   HGBA1C 6.1 (H) 01/24/2023   HGBA1C 5.9 12/07/2022   HGBA1C 5.8 (H) 07/22/2022     How often do you need to have someone help you when you read instructions, pamphlets, or other written materials from your doctor or pharmacy?: 1 - Never What is the last grade level you completed in school?: Associate's degree  Interpreter Needed?: No  Information entered by :: Lolita Libra, CMA   Activities of Daily Living     11/30/2023   10:30 AM 01/24/2023    4:30 PM  In your present state of health, do you have any difficulty performing the following activities:  Hearing? 0 0  Vision? 0 0  Difficulty concentrating or making decisions? 0 0  Walking or climbing stairs? 0   Dressing or bathing? 0   Doing errands, shopping? 0 0  Preparing Food and eating ? N   Using the Toilet? N   In  the past six months, have you accidently leaked urine? N   Do you have problems with loss of bowel control? N   Managing your Medications? N   Managing your Finances? N   Housekeeping or managing your Housekeeping? N     Patient Care Team: Saguier, Edward, PA-C as PCP - General (Internal Medicine) Court Dorn PARAS, MD as PCP - Cardiology (Cardiology) Court Dorn PARAS, MD as Consulting Physician (Cardiology)  I have updated your Care Teams any recent Medical Services you may have received from other providers in the past year.     Assessment:   This is a routine wellness examination for Griffey.  Hearing/Vision screen Hearing Screening - Comments:: Denies hearing difficulties.  Vision Screening - Comments:: up to date with routine eye exams.    Goals Addressed   None    Depression Screen     11/30/2023   10:36 AM 11/25/2022   10:44 AM 10/21/2022    3:39 PM 08/10/2022   11:56 AM 11/03/2021    2:25 PM 06/07/2018    3:31 PM  PHQ 2/9 Scores  PHQ - 2 Score 0 0 0 0 0 0  PHQ- 9 Score 0 0 0 2      Fall Risk     11/30/2023   10:32 AM 11/25/2022   10:47 AM 10/23/2022    2:08 PM 10/21/2022    2:10 PM 10/19/2022    3:11 PM  Fall Risk   Falls in the past year? 0 0 0 0 0  Number falls in past yr: 0 0 0 0 0  Injury with Fall? 0 0 0 0 0  Risk for fall due to : No Fall Risks No Fall Risks No Fall Risks No Fall Risks No Fall Risks  Follow up Education provided Falls prevention discussed Falls evaluation completed Falls evaluation completed Falls evaluation completed    MEDICARE RISK AT HOME:  Medicare Risk at Home Any stairs in or around the home?: Yes If so, are there any without handrails?: No Home free of loose throw rugs in walkways, pet beds, electrical cords, etc?: Yes Adequate lighting in your home to reduce risk of falls?: Yes Life alert?: No Use of a cane, walker or w/c?: No Grab bars in the bathroom?: No Shower chair or bench in shower?: No Elevated toilet seat or a  handicapped toilet?: No  TIMED UP AND GO:  Was the test performed?  No,audio   Cognitive Function: 6CIT completed        11/25/2022   10:49 AM  6CIT Screen  What Year? 0 points  What month? 0 points  What time? 0 points  Count back from 20 0 points  Months in reverse 0 points  Repeat phrase 0 points  Total Score 0 points    Immunizations Immunization History  Administered Date(s) Administered   PFIZER(Purple Top)SARS-COV-2 Vaccination 08/04/2019, 08/28/2019   Pneumococcal Conjugate-13 02/12/2016   Tdap 12/05/2012, 11/18/2021    Screening Tests Health Maintenance  Topic Date Due   Zoster Vaccines- Shingrix (1 of 2) Never done   COVID-19 Vaccine (3 - Pfizer risk series) 09/25/2019   Medicare Annual Wellness (AWV)  11/25/2023   INFLUENZA VACCINE  11/26/2023   Pneumococcal Vaccine: 50+ Years (2 of 2 - PPSV23, PCV20, or PCV21) 11/30/2023 (Originally 04/08/2016)   Colonoscopy  11/30/2023 (Originally 08/19/1994)   DTaP/Tdap/Td (3 - Td or Tdap) 11/19/2031   Hepatitis C Screening  Completed   Hepatitis B Vaccines  Aged Out   HPV VACCINES  Aged Out   Meningococcal B Vaccine  Aged Out    Health Maintenance  Health Maintenance Due  Topic Date Due   Zoster Vaccines- Shingrix (1 of 2) Never done   COVID-19 Vaccine (3 - Pfizer risk series) 09/25/2019   Medicare Annual Wellness (AWV)  11/25/2023   INFLUENZA VACCINE  11/26/2023   Health Maintenance Items Addressed: Pt declines shingles, flu and covid vaccines and is aware to get them at the pharmacy if he changes his mind. All other HM up to date.  Additional Screening:  Vision Screening: Recommended annual ophthalmology exams for early detection of glaucoma and other disorders of the eye. Would you like a referral to an eye doctor? No    Dental Screening: Recommended annual dental exams for proper oral hygiene  Community Resource Referral / Chronic Care Management: CRR required this visit?  No   CCM required this  visit?  No   Plan:    I have personally reviewed and noted the following in the patient's chart:   Medical and social history Use of alcohol, tobacco or illicit drugs  Current medications and supplements including opioid prescriptions. Patient is not currently taking opioid prescriptions. Functional ability and status Nutritional status Physical activity Advanced directives List of other physicians Hospitalizations, surgeries, and ER visits in previous 12 months Vitals Screenings to include cognitive, depression, and falls Referrals and appointments  In addition, I have reviewed and discussed with patient certain preventive protocols, quality metrics, and best practice recommendations. A written personalized care plan for preventive services as well as general preventive health recommendations were provided to patient.   Lolita Libra, CMA   11/30/2023   After Visit Summary: (MyChart) Due to this being a telephonic visit, the after visit summary with patients personalized plan was offered to patient via MyChart   Notes: Nothing significant to report at this time.

## 2023-12-07 NOTE — Progress Notes (Signed)
 Subjective:   Bryan Craig is a 73 y.o. who presents for a Medicare Wellness preventive visit.  As a reminder, Annual Wellness Visits don't include a physical exam, and some assessments may be limited, especially if this visit is performed virtually. We may recommend an in-person follow-up visit with your provider if needed.  Visit Complete: Virtual I connected with  [name] on 12/07/23 by a audio enabled telemedicine application and verified that I am speaking with the correct person using two identifiers.  Patient Location: Home  Provider Location: Office/Clinic  I discussed the limitations of evaluation and management by telemedicine. The patient expressed understanding and agreed to proceed.  Vital Signs: Because this visit was a virtual/telehealth visit, some criteria may be missing or patient reported. Any vitals not documented were not able to be obtained and vitals that have been documented are patient reported.  VideoDeclined- This patient declined Librarian, academic. Therefore the visit was completed with audio only.  Persons Participating in Visit: Patient.  AWV Questionnaire: No: Patient Medicare AWV questionnaire was not completed prior to this visit.  Cardiac Risk Factors include: advanced age (>78men, >43 women);dyslipidemia;hypertension;Other (see comment), Risk factor comments: MI-acute heart failure     Objective:    Today's Vitals   11/30/23 1022  Weight: 156 lb (70.8 kg)  Height: 5' 4 (1.626 m)   Body mass index is 26.78 kg/m.     11/30/2023   10:37 AM 01/24/2023    4:30 PM 01/24/2023    2:32 PM 11/25/2022   10:47 AM 07/21/2022    4:58 PM 02/13/2022   10:03 AM 03/03/2021    9:21 AM  Advanced Directives  Does Patient Have a Medical Advance Directive? No No No No No No No  Would patient like information on creating a medical advance directive? No - Patient declined No - Patient declined  Yes (MAU/Ambulatory/Procedural  Areas - Information given) No - Patient declined  No - Patient declined    Current Medications (verified) Outpatient Encounter Medications as of 11/30/2023  Medication Sig   aspirin  EC 81 MG tablet Take 1 tablet (81 mg total) by mouth daily. Swallow whole.   atorvastatin  (LIPITOR) 80 MG tablet Take 1 tablet (80 mg total) by mouth at bedtime. (Patient taking differently: Take 80 mg by mouth daily.)   ezetimibe  (ZETIA ) 10 MG tablet TAKE 1 TABLET BY MOUTH EVERY DAY   metoprolol  succinate (TOPROL -XL) 50 MG 24 hr tablet TAKE 1 TABLET (50 MG TOTAL) BY MOUTH DAILY. NEEDS APPT   prasugrel  (EFFIENT ) 10 MG TABS tablet Take 1 tablet (10 mg total) by mouth daily.   prednisoLONE acetate (PRED FORTE) 1 % ophthalmic suspension Place 1 drop into the left eye daily.   Risankizumab-rzaa (SKYRIZI) 150 MG/ML SOSY Inject into the skin every 3 (three) months.   telmisartan  (MICARDIS ) 80 MG tablet TAKE 1 TABLET (80 MG TOTAL) BY MOUTH DAILY. NEEDS APPT   triamcinolone  cream (KENALOG ) 0.1 % Apply 1 Application topically 2 (two) times daily. (Patient taking differently: Apply 1 Application topically 2 (two) times daily as needed (irritation).)   [DISCONTINUED] nitroGLYCERIN  (NITROSTAT ) 0.4 MG SL tablet Place 1 tablet (0.4 mg total) under the tongue every 5 (five) minutes as needed for chest pain (CP or SOB). (Patient not taking: Reported on 11/30/2023)   No facility-administered encounter medications on file as of 11/30/2023.    Allergies (verified) Lisinopril, Nitroglycerin , and Penicillins   History: Past Medical History:  Diagnosis Date   CAD (coronary artery disease)  Hyperlipidemia    Hypertension    Kidney stone    Retinal detachment    Past Surgical History:  Procedure Laterality Date   CORONARY STENT INTERVENTION N/A 07/22/2022   Procedure: CORONARY STENT INTERVENTION;  Surgeon: Verlin Lonni BIRCH, MD;  Location: [facility];  Service: Cardiovascular;  Laterality: N/A;   CORONARY/GRAFT ACUTE  MI REVASCULARIZATION N/A 01/24/2023   Procedure: Coronary/Graft Acute MI Revascularization;  Surgeon: Verlin Lonni BIRCH, MD;  Location: [facility];  Service: Cardiovascular;  Laterality: N/A;   EYE SURGERY     LEFT HEART CATH AND CORONARY ANGIOGRAPHY N/A 07/22/2022   Procedure: LEFT HEART CATH AND CORONARY ANGIOGRAPHY;  Surgeon: Verlin Lonni BIRCH, MD;  Location: [facility];  Service: Cardiovascular;  Laterality: N/A;   LEFT HEART CATH AND CORONARY ANGIOGRAPHY N/A 01/24/2023   Procedure: LEFT HEART CATH AND CORONARY ANGIOGRAPHY;  Surgeon: Verlin Lonni BIRCH, MD;  Location: [facility];  Service: Cardiovascular;  Laterality: N/A;   Family History  Problem Relation Age of Onset   Hyperlipidemia Mother    Hypertension Mother    Heart disease Mother    Heart attack Father 72   Social History   Socioeconomic History   Marital status: Married    Spouse name: Lonell   Number of children: 3   Years of education: Not on file   Highest education level: Not on file  Occupational History   Occupation: retired    Comment: Civil Service fast streamer  Tobacco Use   Smoking status: Never   Smokeless tobacco: Never  Vaping Use   Vaping status: Never Used  Substance and Sexual Activity   Alcohol use: Yes    Comment: 1 beer every 2-3 weeks   Drug use: Never   Sexual activity: Yes  Other Topics Concern   Not on file  Social History Narrative   Married, 2 daughters with current wife and 1 daughter from previous marriage   Social Drivers of Corporate investment banker Strain: Low Risk  (11/30/2023)   Overall Financial Resource Strain (CARDIA)    Difficulty of Paying Living Expenses: Not hard at all  Food Insecurity: No Food Insecurity (11/30/2023)   Hunger Vital Sign    Worried About Running Out of Food in the Last Year: Never true    Ran Out of Food in the Last Year: Never true  Transportation Needs: No Transportation Needs (11/30/2023)   PRAPARE -  Administrator, Civil Service (Medical): No    Lack of Transportation (Non-Medical): No  Physical Activity: Sufficiently Active (11/30/2023)   Exercise Vital Sign    Days of Exercise per Week: 7 days    Minutes of Exercise per Session: 50 min  Stress: No Stress Concern Present (11/30/2023)   [employer] of Occupational Health - Occupational Stress Questionnaire    Feeling of Stress: Not at all  Social Connections: Unknown (11/30/2023)   Social Connection and Isolation Panel    Frequency of Communication with Friends and Family: More than three times a week    Frequency of Social Gatherings with Friends and Family: More than three times a week    Attends Religious Services: Not on Marketing executive or Organizations: No    Attends Banker Meetings: Never    Marital Status: Married    Tobacco Counseling Counseling given: Not Answered    Clinical Intake:  Pre-visit preparation completed: Yes  Pain : No/denies pain  BMI - recorded: 26.78 Nutritional Status: BMI 25 -29 Overweight Nutritional Risks: None Diabetes: No  Lab Results  Component Value Date   HGBA1C 6.1 (H) 01/24/2023   HGBA1C 5.9 12/07/2022   HGBA1C 5.8 (H) 07/22/2022     How often do you need to have someone help you when you read instructions, pamphlets, or other written materials from your doctor or pharmacy?: 1 - Never What is the last grade level you completed in school?: Associate's degree  Interpreter Needed?: No  Information entered by :: Lolita Libra, CMA   Activities of Daily Living     11/30/2023   10:30 AM 01/24/2023    4:30 PM  In your present state of health, do you have any difficulty performing the following activities:  Hearing? 0 0  Vision? 0 0  Difficulty concentrating or making decisions? 0 0  Walking or climbing stairs? 0   Dressing or bathing? 0   Doing errands, shopping? 0 0  Preparing Food and eating ? N   Using the Toilet? N   In  the past six months, have you accidently leaked urine? N   Do you have problems with loss of bowel control? N   Managing your Medications? N   Managing your Finances? N   Housekeeping or managing your Housekeeping? N     Patient Care Team: Saguier, Edward, PA-C as PCP - General (Internal Medicine) Court Dorn PARAS, MD as PCP - Cardiology (Cardiology) Court Dorn PARAS, MD as Consulting Physician (Cardiology)  I have updated your Care Teams any recent Medical Services you may have received from other providers in the past year.     Assessment:   This is a routine wellness examination for Agustus.  Hearing/Vision screen Hearing Screening - Comments:: Denies hearing difficulties.  Vision Screening - Comments:: up to date with routine eye exams.    Goals Addressed   None    Depression Screen     11/30/2023   10:36 AM 11/25/2022   10:44 AM 10/21/2022    3:39 PM 08/10/2022   11:56 AM 11/03/2021    2:25 PM 06/07/2018    3:31 PM  PHQ 2/9 Scores  PHQ - 2 Score 0 0 0 0 0 0  PHQ- 9 Score 0 0 0 2      Fall Risk     11/30/2023   10:32 AM 11/25/2022   10:47 AM 10/23/2022    2:08 PM 10/21/2022    2:10 PM 10/19/2022    3:11 PM  Fall Risk   Falls in the past year? 0 0 0 0 0  Number falls in past yr: 0 0 0 0 0  Injury with Fall? 0 0 0 0 0  Risk for fall due to : No Fall Risks No Fall Risks No Fall Risks No Fall Risks No Fall Risks  Follow up Education provided Falls prevention discussed Falls evaluation completed Falls evaluation completed Falls evaluation completed    MEDICARE RISK AT HOME:  Medicare Risk at Home Any stairs in or around the home?: Yes If so, are there any without handrails?: No Home free of loose throw rugs in walkways, pet beds, electrical cords, etc?: Yes Adequate lighting in your home to reduce risk of falls?: Yes Life alert?: No Use of a cane, walker or w/c?: No Grab bars in the bathroom?: No Shower chair or bench in shower?: No Elevated toilet seat or a  handicapped toilet?: No  TIMED UP AND GO:  Was the test performed?  No,audio   Cognitive Function: 6CIT completed        12/07/2023   12:04 PM 11/25/2022   10:49 AM  6CIT Screen  What Year? 0 points 0 points  What month? 0 points 0 points  What time? 0 points 0 points  Count back from 20 0 points 0 points  Months in reverse 0 points 0 points  Repeat phrase 0 points 0 points  Total Score 0 points 0 points  Cognitive screening was completed on 11/30/23 and documentation was initially omitted.  Immunizations Immunization History  Administered Date(s) Administered   PFIZER(Purple Top)SARS-COV-2 Vaccination 08/04/2019, 08/28/2019   Pneumococcal Conjugate-13 02/12/2016   Tdap 12/05/2012, 11/18/2021    Screening Tests Health Maintenance  Topic Date Due   Colonoscopy  Never done   Pneumococcal Vaccine: 50+ Years (2 of 2 - PPSV23, PCV20, or PCV21) 04/08/2016   INFLUENZA VACCINE  11/29/2024 (Originally 11/26/2023)   COVID-19 Vaccine (3 - Pfizer risk series) 11/29/2024 (Originally 09/25/2019)   Zoster Vaccines- Shingrix (1 of 2) 11/29/2024 (Originally 08/18/1969)   Medicare Annual Wellness (AWV)  11/29/2024   DTaP/Tdap/Td (3 - Td or Tdap) 11/19/2031   Hepatitis C Screening  Completed   Hepatitis B Vaccines  Aged Out   HPV VACCINES  Aged Out   Meningococcal B Vaccine  Aged Out    Health Maintenance  Health Maintenance Due  Topic Date Due   Colonoscopy  Never done   Pneumococcal Vaccine: 50+ Years (2 of 2 - PPSV23, PCV20, or PCV21) 04/08/2016   Health Maintenance Items Addressed: Pt declines shingles, flu and covid vaccines and is aware to get them at the pharmacy if he changes his mind. All other HM up to date.  Additional Screening:  Vision Screening: Recommended annual ophthalmology exams for early detection of glaucoma and other disorders of the eye. Would you like a referral to an eye doctor? No    Dental Screening: Recommended annual dental exams for proper oral  hygiene  Community Resource Referral / Chronic Care Management: CRR required this visit?  No   CCM required this visit?  No   Plan:    I have personally reviewed and noted the following in the patient's chart:   Medical and social history Use of alcohol, tobacco or illicit drugs  Current medications and supplements including opioid prescriptions. Patient is not currently taking opioid prescriptions. Functional ability and status Nutritional status Physical activity Advanced directives List of other physicians Hospitalizations, surgeries, and ER visits in previous 12 months Vitals Screenings to include cognitive, depression, and falls Referrals and appointments  In addition, I have reviewed and discussed with patient certain preventive protocols, quality metrics, and best practice recommendations. A written personalized care plan for preventive services as well as general preventive health recommendations were provided to patient.   Lolita Libra, CMA   12/07/2023   After Visit Summary: (MyChart) Due to this being a telephonic visit, the after visit summary with patients personalized plan was offered to patient via MyChart   Notes: Nothing significant to report at this time.

## 2023-12-13 ENCOUNTER — Other Ambulatory Visit (HOSPITAL_COMMUNITY): Payer: Self-pay

## 2023-12-14 ENCOUNTER — Other Ambulatory Visit (HOSPITAL_COMMUNITY): Payer: Self-pay

## 2024-01-10 ENCOUNTER — Other Ambulatory Visit (HOSPITAL_COMMUNITY): Payer: Self-pay

## 2024-01-10 ENCOUNTER — Other Ambulatory Visit: Payer: Self-pay | Admitting: Cardiology

## 2024-01-11 ENCOUNTER — Other Ambulatory Visit (HOSPITAL_COMMUNITY): Payer: Self-pay

## 2024-01-11 MED ORDER — PRASUGREL HCL 10 MG PO TABS
10.0000 mg | ORAL_TABLET | Freq: Every day | ORAL | 1 refills | Status: AC
  Filled 2024-01-11: qty 90, 90d supply, fill #0
  Filled 2024-04-10: qty 90, 90d supply, fill #1

## 2024-01-25 ENCOUNTER — Telehealth: Payer: Self-pay

## 2024-01-25 NOTE — Telephone Encounter (Signed)
 Copied from CRM [id number]. Topic: General - Other >> Jan 25, 2024  9:48 AM Burnard DEL wrote: Reason for CRM: [facility] called requesting a phone call regarding  clinic care opportunity for provider to review for patient.

## 2024-02-20 ENCOUNTER — Other Ambulatory Visit: Payer: Self-pay | Admitting: Cardiovascular Disease

## 2024-02-23 ENCOUNTER — Other Ambulatory Visit (HOSPITAL_COMMUNITY): Payer: Self-pay

## 2024-02-23 ENCOUNTER — Other Ambulatory Visit: Payer: Self-pay | Admitting: Medical

## 2024-02-23 MED ORDER — ATORVASTATIN CALCIUM 80 MG PO TABS
80.0000 mg | ORAL_TABLET | Freq: Every day | ORAL | 1 refills | Status: AC
  Filled 2024-02-23: qty 90, 90d supply, fill #0

## 2024-02-24 ENCOUNTER — Other Ambulatory Visit (HOSPITAL_COMMUNITY): Payer: Self-pay

## 2024-02-28 ENCOUNTER — Telehealth: Payer: Self-pay

## 2024-02-28 NOTE — Telephone Encounter (Signed)
 Patient is overdue for an appointment. LMOM for patient to call and schedule.

## 2024-03-09 ENCOUNTER — Ambulatory Visit (INDEPENDENT_AMBULATORY_CARE_PROVIDER_SITE_OTHER): Admitting: Medical

## 2024-03-09 ENCOUNTER — Encounter: Payer: Self-pay | Admitting: Medical

## 2024-03-09 VITALS — BP 110/70 | HR 74 | Temp 97.9°F | Resp 15 | Ht 64.0 in | Wt 163.0 lb

## 2024-03-09 DIAGNOSIS — E785 Hyperlipidemia, unspecified: Secondary | ICD-10-CM | POA: Diagnosis not present

## 2024-03-09 DIAGNOSIS — I10 Essential (primary) hypertension: Secondary | ICD-10-CM | POA: Diagnosis not present

## 2024-03-09 DIAGNOSIS — K8689 Other specified diseases of pancreas: Secondary | ICD-10-CM

## 2024-03-09 DIAGNOSIS — D649 Anemia, unspecified: Secondary | ICD-10-CM | POA: Diagnosis not present

## 2024-03-09 DIAGNOSIS — K8020 Calculus of gallbladder without cholecystitis without obstruction: Secondary | ICD-10-CM

## 2024-03-09 DIAGNOSIS — R739 Hyperglycemia, unspecified: Secondary | ICD-10-CM | POA: Diagnosis not present

## 2024-03-09 DIAGNOSIS — K824 Cholesterolosis of gallbladder: Secondary | ICD-10-CM

## 2024-03-09 LAB — HEMOGLOBIN A1C: Hgb A1c MFr Bld: 6.1 % (ref 4.6–6.5)

## 2024-03-09 LAB — CBC WITH DIFFERENTIAL/PLATELET
Basophils Absolute: 0 K/uL (ref 0.0–0.1)
Basophils Relative: 0.9 % (ref 0.0–3.0)
Eosinophils Absolute: 0.1 K/uL (ref 0.0–0.7)
Eosinophils Relative: 2.8 % (ref 0.0–5.0)
HCT: 28.9 % — ABNORMAL LOW (ref 39.0–52.0)
Hemoglobin: 8.6 g/dL — ABNORMAL LOW (ref 13.0–17.0)
Lymphocytes Relative: 37.8 % (ref 12.0–46.0)
Lymphs Abs: 1.9 K/uL (ref 0.7–4.0)
MCHC: 29.8 g/dL — ABNORMAL LOW (ref 30.0–36.0)
MCV: 61.9 fl — ABNORMAL LOW (ref 78.0–100.0)
Monocytes Absolute: 0.7 K/uL (ref 0.1–1.0)
Monocytes Relative: 13.7 % — ABNORMAL HIGH (ref 3.0–12.0)
Neutro Abs: 2.2 K/uL (ref 1.4–7.7)
Neutrophils Relative %: 44.8 % (ref 43.0–77.0)
Platelets: 440 K/uL — ABNORMAL HIGH (ref 150.0–400.0)
RBC: 4.67 Mil/uL (ref 4.22–5.81)
RDW: 19.6 % — ABNORMAL HIGH (ref 11.5–15.5)
WBC: 4.9 K/uL (ref 4.0–10.5)

## 2024-03-09 LAB — LIPID PANEL
Cholesterol: 111 mg/dL (ref 0–200)
HDL: 36.8 mg/dL — ABNORMAL LOW (ref >39.00)
LDL Cholesterol: 62 mg/dL (ref 0–99)
NonHDL: 74.66
Total CHOL/HDL Ratio: 3
Triglycerides: 64 mg/dL (ref 0.0–149.0)
VLDL: 12.8 mg/dL (ref 0.0–40.0)

## 2024-03-09 MED ORDER — TELMISARTAN 40 MG PO TABS: 40.0000 mg | ORAL_TABLET | Freq: Every day | ORAL | 0 refills | Status: DC

## 2024-03-09 NOTE — Progress Notes (Unsigned)
 Bryan Craig

## 2024-03-09 NOTE — Patient Instructions (Addendum)
 Essential hypertension Blood pressure low with symptoms of lightheadedness. Current medications include telmisartan  and metoprolol . Dehydration, anemia, weight loss, and reduced salt intake may contribute. - Reduced telmisartan  to 40 mg daily. - Ordered metabolic panel, CBC, and iron  panel. - Advised increased hydration with Pedialyte. - Instructed to bring home blood pressure machine for calibration. - Advised daily blood pressure monitoring and reporting. - Recommended dietary supplements if meals are insufficient. - Advised caution with position changes.  Anemia May contribute to low blood pressure. - Ordered CBC and iron  panel and iron  panel.  Follow up 2 weeks or sooner if needed.          05-18-2024 telephone note for review  Family called this afternoon having question on Bryan Craig. Pt was discharged on 05-07-2024. On review of that note instructed to follow up with me. Have not seen him since DC though had reviewed hospital notes when sent to me.  At end of DC summary MD stated  Discussed in detail with patient's daughter at bedside, updated care and answered all questions.   I had long conversation estimated 25-30 minutes or more with pt daughter today regarding Bryan Craig  cancer diagnosis. Confirmed with Bryan Craig that I can talk with her since she was not on DPR. I was given permission by Bryan Craig.   Daughter started conversation wanting to know when I had prescribed zofran . I told her date was 03-30-2024. She stated she wanted to know when I had prescribed zofran  as she expressed it appears prescribed Zofran  knowing that he had cancer. I told her at that time it was not known that he had cancer and that I commonly prescribe that for nausea.   On review of chart his lipase had been elevated. Do usually prescribe zofran  for pancreatis as is accompanied by nausea and vomiting.  Did not see his lipase was elevated  initially during conversation with daughter. However,later  after extensive chart review it had been elevated during work up.(I was at home/not at work but did have my computer available).   Pt had imaging studies showing abnormality of gallbladder and pancrease.  Prior lab work showed anemia, blood in stool ,elevated liver enzyme and the elevated lipase prompting referral to both gastroenterologist and surgeon.   03-15-24 and 04-04-2024  notes in epic show calls from  office contacting pt after referral were placed. See those notes.  Note also  my  chart I believe has been active throughout process.   Pt was seen by gi office on 04-14-2024. Lab work was done by GI office and results communicated then appears admission for procedure was moved up.      04/18/24 12:34 PM Result Note The pt has been advised via My Chart in English and Spanish. He does view messages  AFP tumor marker; Cancer antigen 19-9; Lipase; Comprehensive metabolic panel with GFR; CBC with Differential/Platelet     04/18/24 12:34 PM Hemos podido adelantar su cita al 05/01/24. Consulte las nuevas instrucciones a continuacin. Asegrese de verificar el cambio de ubicacin. [facility] ya no es la ubicacin.  On 04-18-2024  GI office provider notified Bryan Craig by mychart(Note I am not sure if patient viewed) Your cancer marker is elevated. We are working on expediting your workup. You should get a phone call from us  soon.    Pt was hospitalized then procedures were done followed by pathology results. Daughter wanted to know when he received cancer diagnosis I explained to her t his biopsy  result came back on  05-01-2024 though on further review may have been results on 05-02-24  showed poorly differentiated adenocarcinoma.    On 05-02-2024 see communication between the 2 GI MDs that I had placed referral to.  Craig, Bryan Raddle., MD to Me  Bryan Craig, Bryan Craig    05/02/24 12:08 PM Result Note Inpatient GI team and medicine team are aware of  this. Referral/consultation with oncology will be placed by the inpatient medicine service. Percutaneous biliary drainage will need to be pursued as there is no endoscopic therapie (this is being pursued as an inpatient) s available at this time for the patient's progressive biliary obstruction. At this point no further GI workup.    05-03-2024 hospitai note mentioned   Gastric biopsy came back positive for adenocarcinoma.  Oncology consulted.       Subjective: Seen and examined earlier this morning.  No major events overnight or this morning.  I met family including wife and 2 daughters in patient's room earlier this morning. Patient was transferred to [facility] for cardiac catheterization early in the morning and I was unable to reevaluate patient physical.     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 - Oncology on board.   Biliary obstruction/elevated LFT-likely due to the above. -Unsuccessful EUS and ERCP by GI due to the above. -S/p percutaneous transhepatic biliary tube placement and antegrade cholangiogram on 1/6. -LFT trended up.  Continue monitoring. -Management per IR   Not on further review pt and family was notified on 05-03-2024 on cancer dx by oncologist.(This line entry 05-20-2024)   On 05-07-2023 dc summary stated(on review did see Palliative care)Also do think IR was consulted.  1/10: PCCM and palliative care MDs had goals of discussion with patient and family (spouse and daughter) and they opted for DC home hospice (presently not candidate for inpatient hospice) with plans for eventual transfer to inpatient hospice when he no longer can have his symptoms controlled at home.     On 05-07-2024 palliative consult  HPI: Bryan Craig is a 73 year old male with a past medical history of coronary artery disease, hyperlipidemia, hypertension, anemia, and myocardial infarction.  Admitted in the setting of cardiogenic shock  required pressor therapy in the ICU.  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  Conversations were held yesterday with both the critical care team and the palliative care team.  Patient's family have elected for him to discharge home with hospice care.  Palliative care remains involved to help with goals of care conversations and discharge planning.  SUMMARY OF RECOMMENDATIONS   DNAR/DNI   Continue current measures   Appreciate transitions of care team working with [facility] [place] to secure DME   Plan for transition home as early as today if all care coordination can be completed   The palliative care team will follow as needed   Pt daughter was upset on the phone and attempted to express sympathies as well as give her understanding complicated time line of event(visit, labs, referral and when biopsy results resulted/when informed. She kept asking when he was told he had cancer. She expressed frustration stating GI MD never told him or family. She also expressed that I had dropped the ball(I explained to her at time of diagnosis he was in hospital). I expressed hospitalist may have been the first to inform him .She expressed negligence occurred and accountability.   Again tried to explain timeline of  recent  visits  prior to admission, various attempts to expedite referral process(attempted communication by phone and my chart and summarized diagnosis of cancer was determined in hospital. Looks like biopsy done on 05-01-2024 and result came back on 05-02-2024.  Prepared above as template to try summarize events for daughter understanding. Will also send copy of this to Supervising MD and office manger.  I also explained that I was not in the office and that it probably would be better to have in office visit such as this coming Tuesday. I could block off 40 minutes to go over details in chart and help daughter get her questions answered. I explained  since she is not on dpr that her mom may need to be present. Daughter stated her mother can't come in explaining would be too much in light of dealing with her husband diagnosis. She also state Mr. Halley not well enough to come in. Pt daughter stated she may come in later but not sure if will come.   Did talk with office manager about potential visit with daughter to discuss with daughter but if patient not in office and he is not present then would have to pay charge that is not covered by insurance.  Print production planner stated they can advise daughter on charge if she comes in to discuss.  After reviewing hospital notes again appear notified 05-03-2024 by oncologist. Pt and family expressed surprise. That was first time notified as pathology results had gotten back on 05-02-2024.

## 2024-03-09 NOTE — Progress Notes (Unsigned)
   Subjective:    Patient ID: Bryan Craig, male    DOB: 10/22/1950, 73 y.o.   MRN: [id number]  HPI  Bryan Craig is a 73 year old male with hypertension who presents with fluctuating blood pressure readings.  Over the past week, he has experienced fluctuating blood pressure readings, with systolic measurements ranging from 103 to 107 mmHg. He feels worse when his blood pressure is low, particularly upon standing, which leads him to lie down to alleviate symptoms. He feels good today, but yesterday was particularly bad.  He is currently taking telmisartan  80 mg daily and metoprolol  50 mg as needed. He did not take metoprolol  yesterday due to concerns about its effects.  There is a concern about his hydration status, as he does not drink enough water despite using Pedialyte. His family member emphasizes the need for increased water intake. He has a history of mild anemia.  He has experienced weight loss recently. His family member mentions that he does not eat three meals a day and avoids eating before sleep, which could impact his morning blood pressure readings. There is also a mention of reduced salt intake in his diet.  He feels worse when his blood pressure is low, especially upon standing. He denies feeling bad when his blood pressure is low, but his family member notes otherwise.   Review of Systems See hpi      Objective:   Physical Exam  General Mental Status- Alert. General Appearance- Not in acute distress.   Skin General: Color- Normal Color. Moisture- Normal Moisture.  Neck No carotid bruits. No JVD.  Chest and Lung Exam Auscultation: Breath Sounds:-Normal.  Cardiovascular Auscultation:Rythm- Regular. Murmurs & Other Heart Sounds:Auscultation of the heart reveals- No Murmurs.  Abdomen Inspection:-Inspeection Normal. Palpation/Percussion:Note:No mass. Palpation and Percussion of the abdomen reveal- Non Tender, Non Distended + BS, no rebound or  guarding.    Neurologic Cranial Nerve exam:- CN III-XII intact(No nystagmus), symmetric smile. Finger to Nose:- Normal/Intact Strength:- 5/5 equal and symmetric strength both upper and lower extremities.       Assessment & Plan:   Patient Instructions  Essential hypertension Blood pressure low with symptoms of lightheadedness. Current medications include telmisartan  and metoprolol . Dehydration, anemia, weight loss, and reduced salt intake may contribute. - Reduced telmisartan  to 40 mg daily. - Ordered metabolic panel, CBC, and iron panel. - Advised increased hydration with Pedialyte. - Instructed to bring home blood pressure machine for calibration. - Advised daily blood pressure monitoring and reporting. - Recommended dietary supplements if meals are insufficient. - Advised caution with position changes.  Anemia May contribute to low blood pressure. - Ordered CBC and iron panel and iron panel.  Follow up 2 weeks or sooner if needed.   Camilia Caywood, PA-C

## 2024-03-10 ENCOUNTER — Ambulatory Visit: Payer: Self-pay | Admitting: Medical

## 2024-03-10 DIAGNOSIS — R748 Abnormal levels of other serum enzymes: Secondary | ICD-10-CM

## 2024-03-11 LAB — COMPLETE METABOLIC PANEL WITHOUT GFR
AG Ratio: 1.9 (calc) (ref 1.0–2.5)
ALT: 251 U/L — ABNORMAL HIGH (ref 9–46)
AST: 171 U/L — ABNORMAL HIGH (ref 10–35)
Albumin: 3.8 g/dL (ref 3.6–5.1)
Alkaline phosphatase (APISO): 250 U/L — ABNORMAL HIGH (ref 35–144)
BUN: 14 mg/dL (ref 7–25)
CO2: 26 mmol/L (ref 20–32)
Calcium: 8.9 mg/dL (ref 8.6–10.3)
Chloride: 105 mmol/L (ref 98–110)
Creat: 0.71 mg/dL (ref 0.70–1.28)
Globulin: 2 g/dL (ref 1.9–3.7)
Glucose, Bld: 89 mg/dL (ref 65–99)
Potassium: 5.4 mmol/L — ABNORMAL HIGH (ref 3.5–5.3)
Sodium: 140 mmol/L (ref 135–146)
Total Bilirubin: 0.3 mg/dL (ref 0.2–1.2)
Total Protein: 5.8 g/dL — ABNORMAL LOW (ref 6.1–8.1)

## 2024-03-11 LAB — IRON,TIBC AND FERRITIN PANEL
%SAT: 3 % — ABNORMAL LOW (ref 20–48)
Ferritin: 6 ng/mL — ABNORMAL LOW (ref 24–380)
Iron: 11 ug/dL — ABNORMAL LOW (ref 50–180)
TIBC: 339 ug/dL (ref 250–425)

## 2024-03-11 MED ORDER — SODIUM POLYSTYRENE SULFONATE 15 GM/60ML CO SUSP
0 refills | Status: DC
  Filled 2024-03-11: qty 240, 4d supply, fill #0

## 2024-03-11 MED ORDER — IRON (FERROUS SULFATE) 325 (65 FE) MG PO TABS
ORAL_TABLET | ORAL | 0 refills | Status: DC
  Filled 2024-03-11: qty 60, 30d supply, fill #0

## 2024-03-11 NOTE — Addendum Note (Signed)
 Addended by: DORINA DALLAS HERO on: 03/11/2024 10:49 AM   Modules accepted: Orders

## 2024-03-13 ENCOUNTER — Other Ambulatory Visit: Payer: Self-pay

## 2024-03-13 ENCOUNTER — Other Ambulatory Visit (INDEPENDENT_AMBULATORY_CARE_PROVIDER_SITE_OTHER)

## 2024-03-13 ENCOUNTER — Other Ambulatory Visit (HOSPITAL_BASED_OUTPATIENT_CLINIC_OR_DEPARTMENT_OTHER): Payer: Self-pay

## 2024-03-13 DIAGNOSIS — R748 Abnormal levels of other serum enzymes: Secondary | ICD-10-CM

## 2024-03-13 DIAGNOSIS — D649 Anemia, unspecified: Secondary | ICD-10-CM | POA: Diagnosis not present

## 2024-03-13 LAB — COMPREHENSIVE METABOLIC PANEL WITH GFR
ALT: 227 U/L — ABNORMAL HIGH (ref 0–53)
AST: 136 U/L — ABNORMAL HIGH (ref 0–37)
Albumin: 3.4 g/dL — ABNORMAL LOW (ref 3.5–5.2)
Alkaline Phosphatase: 262 U/L — ABNORMAL HIGH (ref 39–117)
BUN: 12 mg/dL (ref 6–23)
CO2: 26 meq/L (ref 19–32)
Calcium: 8.4 mg/dL (ref 8.4–10.5)
Chloride: 103 meq/L (ref 96–112)
Creatinine, Ser: 0.74 mg/dL (ref 0.40–1.50)
GFR: 89.22 mL/min (ref >60.00)
Glucose, Bld: 136 mg/dL — ABNORMAL HIGH (ref 70–99)
Potassium: 4.7 meq/L (ref 3.5–5.1)
Sodium: 136 meq/L (ref 135–145)
Total Bilirubin: 0.5 mg/dL (ref 0.2–1.2)
Total Protein: 5.2 g/dL — ABNORMAL LOW (ref 6.0–8.3)

## 2024-03-13 LAB — CBC WITH DIFFERENTIAL/PLATELET
Basophils Absolute: 0 K/uL (ref 0.0–0.1)
Basophils Relative: 0.7 % (ref 0.0–3.0)
Eosinophils Absolute: 0.2 K/uL (ref 0.0–0.7)
Eosinophils Relative: 4.1 % (ref 0.0–5.0)
HCT: 27.8 % — ABNORMAL LOW (ref 39.0–52.0)
Hemoglobin: 8.3 g/dL — ABNORMAL LOW (ref 13.0–17.0)
Lymphocytes Relative: 37.8 % (ref 12.0–46.0)
Lymphs Abs: 1.6 K/uL (ref 0.7–4.0)
MCHC: 30 g/dL (ref 30.0–36.0)
MCV: 61.4 fl — ABNORMAL LOW (ref 78.0–100.0)
Monocytes Absolute: 0.4 K/uL (ref 0.1–1.0)
Monocytes Relative: 10 % (ref 3.0–12.0)
Neutro Abs: 2 K/uL (ref 1.4–7.7)
Neutrophils Relative %: 47.4 % (ref 43.0–77.0)
Platelets: 455 K/uL — ABNORMAL HIGH (ref 150.0–400.0)
RBC: 4.53 Mil/uL (ref 4.22–5.81)
RDW: 19.2 % — ABNORMAL HIGH (ref 11.5–15.5)
WBC: 4.3 K/uL (ref 4.0–10.5)

## 2024-03-14 LAB — HEPATITIS C ANTIBODY: Hepatitis C Ab: NONREACTIVE

## 2024-03-14 LAB — HEPATITIS A ANTIBODY, TOTAL: Hepatitis A AB,Total: REACTIVE — AB

## 2024-03-14 LAB — IRON,TIBC AND FERRITIN PANEL
%SAT: 5 % — ABNORMAL LOW (ref 20–48)
Ferritin: 7 ng/mL — ABNORMAL LOW (ref 24–380)
Iron: 15 ug/dL — ABNORMAL LOW (ref 50–180)
TIBC: 307 ug/dL (ref 250–425)

## 2024-03-14 LAB — HEPATITIS B E ANTIBODY: Hep B E Ab: NONREACTIVE

## 2024-03-14 LAB — HEPATITIS B CORE ANTIBODY, TOTAL: Hep B Core Total Ab: NONREACTIVE

## 2024-03-14 LAB — HEPATITIS B SURFACE ANTIBODY,QUALITATIVE: Hep B S Ab: NONREACTIVE

## 2024-03-15 NOTE — Progress Notes (Signed)
Called patient but no answer, left voice mail for patient to call back. This message was recorded in spanish

## 2024-03-16 NOTE — Progress Notes (Signed)
 Mild improved iron levels. Need to review stool test/ifob test for blood. That is very important with recent anemia and low iron. Liver enzymes elevated. Please follow thru with ultrasound of liver. Hepatitis studies so far negative except hep A antibody came back positive. This may represent old infection and not acute. Type A is self limiting. Any recent nausea, vomting for diarrhea over past 3 weeks. Follow up with me in 3 weeks. Will repeat liver enzymes at that time.   [id number] GLENWOOD Odor - Spanish interpreter  Called the patient with the results from the provider. If there are further questions or concerns, they can follow up with the office for an appointment.

## 2024-03-17 ENCOUNTER — Ambulatory Visit (HOSPITAL_BASED_OUTPATIENT_CLINIC_OR_DEPARTMENT_OTHER)

## 2024-03-21 ENCOUNTER — Ambulatory Visit (HOSPITAL_BASED_OUTPATIENT_CLINIC_OR_DEPARTMENT_OTHER)
Admission: RE | Admit: 2024-03-21 | Discharge: 2024-03-21 | Disposition: A | Discharge status: Home / Self Care | Source: Ambulatory Visit | Attending: Medical | Admitting: Medical

## 2024-03-21 DIAGNOSIS — R748 Abnormal levels of other serum enzymes: Secondary | ICD-10-CM | POA: Diagnosis present

## 2024-03-21 NOTE — Addendum Note (Signed)
 Addended by: DORINA DALLAS HERO on: 03/21/2024 09:30 PM   Modules accepted: Orders

## 2024-03-21 NOTE — Addendum Note (Signed)
 Addended by: DORINA DALLAS HERO on: 03/21/2024 09:26 PM   Modules accepted: Orders

## 2024-03-28 ENCOUNTER — Ambulatory Visit (HOSPITAL_BASED_OUTPATIENT_CLINIC_OR_DEPARTMENT_OTHER)
Admission: RE | Admit: 2024-03-28 | Discharge: 2024-03-28 | Disposition: A | Source: Ambulatory Visit | Attending: Medical | Admitting: Medical

## 2024-03-28 ENCOUNTER — Other Ambulatory Visit: Payer: Self-pay | Admitting: Medical

## 2024-03-28 DIAGNOSIS — K8689 Other specified diseases of pancreas: Secondary | ICD-10-CM

## 2024-03-28 MED ORDER — GADOBUTROL 1 MMOL/ML IV SOLN
7.3000 mL | Freq: Once | INTRAVENOUS | Status: AC | PRN
  Administered 2024-03-28: 7.3 mL via INTRAVENOUS

## 2024-03-29 ENCOUNTER — Ambulatory Visit: Payer: Self-pay | Admitting: Medical

## 2024-03-29 NOTE — Addendum Note (Signed)
 Addended by: DORINA DALLAS HERO on: 03/29/2024 02:44 PM   Modules accepted: Orders

## 2024-03-29 NOTE — Addendum Note (Signed)
 Addended by: DORINA DALLAS HERO on: 03/29/2024 02:36 PM   Modules accepted: Orders

## 2024-03-30 ENCOUNTER — Other Ambulatory Visit (HOSPITAL_BASED_OUTPATIENT_CLINIC_OR_DEPARTMENT_OTHER): Payer: Self-pay

## 2024-03-30 ENCOUNTER — Other Ambulatory Visit

## 2024-03-30 ENCOUNTER — Other Ambulatory Visit: Payer: Self-pay

## 2024-03-30 DIAGNOSIS — K8689 Other specified diseases of pancreas: Secondary | ICD-10-CM

## 2024-03-30 DIAGNOSIS — D649 Anemia, unspecified: Secondary | ICD-10-CM | POA: Diagnosis not present

## 2024-03-30 LAB — COMPREHENSIVE METABOLIC PANEL WITH GFR
ALT: 265 U/L — ABNORMAL HIGH (ref 0–53)
AST: 151 U/L — ABNORMAL HIGH (ref 0–37)
Albumin: 3.6 g/dL (ref 3.5–5.2)
Alkaline Phosphatase: 576 U/L — ABNORMAL HIGH (ref 39–117)
BUN: 14 mg/dL (ref 6–23)
CO2: 27 meq/L (ref 19–32)
Calcium: 8.9 mg/dL (ref 8.4–10.5)
Chloride: 103 meq/L (ref 96–112)
Creatinine, Ser: 0.76 mg/dL (ref 0.40–1.50)
GFR: 88.48 mL/min (ref >60.00)
Glucose, Bld: 108 mg/dL — ABNORMAL HIGH (ref 70–99)
Potassium: 3.9 meq/L (ref 3.5–5.1)
Sodium: 138 meq/L (ref 135–145)
Total Bilirubin: 0.7 mg/dL (ref 0.2–1.2)
Total Protein: 6 g/dL (ref 6.0–8.3)

## 2024-03-30 LAB — CBC WITH DIFFERENTIAL/PLATELET
Basophils Relative: 0 % (ref 0.0–3.0)
Eosinophils Relative: 2 % (ref 0.0–5.0)
HCT: 27.5 % — ABNORMAL LOW (ref 39.0–52.0)
Hemoglobin: 8.3 g/dL — ABNORMAL LOW (ref 13.0–17.0)
Lymphocytes Relative: 37 % (ref 12.0–46.0)
MCHC: 30.1 g/dL (ref 30.0–36.0)
MCV: 61.2 fl — ABNORMAL LOW (ref 78.0–100.0)
Monocytes Relative: 7 % (ref 3.0–12.0)
Neutrophils Relative %: 53 % (ref 43.0–77.0)
Platelets: 458 K/uL — ABNORMAL HIGH (ref 150.0–400.0)
RBC: 4.5 Mil/uL (ref 4.22–5.81)
RDW: 20.9 % — ABNORMAL HIGH (ref 11.5–15.5)
WBC: 5.1 K/uL (ref 4.0–10.5)

## 2024-03-30 LAB — LIPASE: Lipase: 102 U/L — ABNORMAL HIGH (ref 11.0–59.0)

## 2024-03-30 MED ORDER — ONDANSETRON HCL 4 MG PO TABS
4.0000 mg | ORAL_TABLET | Freq: Three times a day (TID) | ORAL | 0 refills | Status: AC | PRN
  Filled 2024-03-30: qty 20, 7d supply, fill #0

## 2024-03-30 NOTE — Addendum Note (Signed)
 Addended by: DORINA DALLAS HERO on: 03/30/2024 11:46 AM   Modules accepted: Orders

## 2024-03-30 NOTE — Addendum Note (Signed)
 Addended by: DORINA DALLAS HERO on: 03/30/2024 02:56 PM   Modules accepted: Orders

## 2024-03-30 NOTE — Addendum Note (Signed)
 Addended by: TRUDY CURVIN RAMAN on: 03/30/2024 12:01 PM   Modules accepted: Orders

## 2024-03-31 ENCOUNTER — Other Ambulatory Visit (HOSPITAL_BASED_OUTPATIENT_CLINIC_OR_DEPARTMENT_OTHER): Payer: Self-pay

## 2024-03-31 LAB — IRON,TIBC AND FERRITIN PANEL
%SAT: 3 % — ABNORMAL LOW (ref 20–48)
Ferritin: 10 ng/mL — ABNORMAL LOW (ref 24–380)
Iron: 11 ug/dL — ABNORMAL LOW (ref 50–180)
TIBC: 357 ug/dL (ref 250–425)

## 2024-03-31 MED ORDER — IRON (FERROUS SULFATE) 325 (65 FE) MG PO TABS
1.0000 | ORAL_TABLET | Freq: Three times a day (TID) | ORAL | 0 refills | Status: AC
  Filled 2024-03-31: qty 100, 33d supply, fill #0

## 2024-03-31 NOTE — Addendum Note (Signed)
 Addended by: DORINA DALLAS HERO on: 03/31/2024 12:36 PM   Modules accepted: Orders

## 2024-04-03 ENCOUNTER — Ambulatory Visit: Payer: Self-pay | Admitting: Medical

## 2024-04-03 ENCOUNTER — Encounter: Payer: Self-pay | Admitting: Medical

## 2024-04-03 ENCOUNTER — Ambulatory Visit: Admitting: Medical

## 2024-04-03 VITALS — BP 124/89 | HR 94 | Temp 97.8°F | Resp 15 | Ht 64.0 in | Wt 161.6 lb

## 2024-04-03 DIAGNOSIS — D649 Anemia, unspecified: Secondary | ICD-10-CM | POA: Diagnosis not present

## 2024-04-03 DIAGNOSIS — K8020 Calculus of gallbladder without cholecystitis without obstruction: Secondary | ICD-10-CM

## 2024-04-03 DIAGNOSIS — K859 Acute pancreatitis without necrosis or infection, unspecified: Secondary | ICD-10-CM

## 2024-04-03 DIAGNOSIS — R748 Abnormal levels of other serum enzymes: Secondary | ICD-10-CM

## 2024-04-03 DIAGNOSIS — K921 Melena: Secondary | ICD-10-CM

## 2024-04-03 DIAGNOSIS — K861 Other chronic pancreatitis: Secondary | ICD-10-CM

## 2024-04-03 LAB — CBC WITH DIFFERENTIAL/PLATELET
Basophils Relative: 0.7 % (ref 0.0–3.0)
Eosinophils Relative: 1.9 % (ref 0.0–5.0)
HCT: 29.1 % — ABNORMAL LOW (ref 39.0–52.0)
Hemoglobin: 8.8 g/dL — ABNORMAL LOW (ref 13.0–17.0)
Lymphocytes Relative: 54.9 % — ABNORMAL HIGH (ref 12.0–46.0)
MCHC: 30.2 g/dL (ref 30.0–36.0)
MCV: 61.1 fl — ABNORMAL LOW (ref 78.0–100.0)
Monocytes Relative: 9.5 % (ref 3.0–12.0)
Neutrophils Relative %: 33 % — ABNORMAL LOW (ref 43.0–77.0)
Platelets: 474 K/uL — ABNORMAL HIGH (ref 150.0–400.0)
RBC: 4.77 Mil/uL (ref 4.22–5.81)
RDW: 21.2 % — ABNORMAL HIGH (ref 11.5–15.5)
WBC: 4.3 K/uL (ref 4.0–10.5)

## 2024-04-03 LAB — COMPREHENSIVE METABOLIC PANEL WITH GFR
ALT: 397 U/L — ABNORMAL HIGH (ref 0–53)
AST: 294 U/L — ABNORMAL HIGH (ref 0–37)
Albumin: 3.6 g/dL (ref 3.5–5.2)
Alkaline Phosphatase: 833 U/L — ABNORMAL HIGH (ref 39–117)
BUN: 11 mg/dL (ref 6–23)
CO2: 28 meq/L (ref 19–32)
Calcium: 8.9 mg/dL (ref 8.4–10.5)
Chloride: 100 meq/L (ref 96–112)
Creatinine, Ser: 0.7 mg/dL (ref 0.40–1.50)
GFR: 90.7 mL/min (ref >60.00)
Glucose, Bld: 96 mg/dL (ref 70–99)
Potassium: 4.2 meq/L (ref 3.5–5.1)
Sodium: 134 meq/L — ABNORMAL LOW (ref 135–145)
Total Bilirubin: 0.7 mg/dL (ref 0.2–1.2)
Total Protein: 6.2 g/dL (ref 6.0–8.3)

## 2024-04-03 LAB — LIPASE: Lipase: 132 U/L — ABNORMAL HIGH (ref 11.0–59.0)

## 2024-04-03 NOTE — Progress Notes (Signed)
 Subjective:    Patient ID: Bryan Craig, male    DOB: 03-Apr-1951, 73 y.o.   MRN: [id number]  HPI Bryan Craig is a 73 year old male who presents for coordination of care between gastroenterology and surgery due to gallstones, elevated liver enzymes and possible pancrease mass.  He has a gallstone in the gallbladder about 10 mm on ultrasound with other imaging suggesting a smaller size. Imaging also showed a narrowed duct and dilated area in the pancreatic head. He has early satiety and mild upper abdominal discomfort with belching. He has no nausea or vomiting and has not needed ondansetron .  He follows a low fat diet with soft foods including bread, soup, crackers, bananas, fruits, and vegetables. He avoids alcohol.  His lipase was 102 (elevated). He has anemia with recent hemoglobin 8.3 and hematocrit 27, down from 11.4 a year ago. He was asked to check for blood in the stool for possible gastrointestinal bleeding.  He takes blood pressure medication three times a day. His blood pressure has been stable, most recently 120/85.   Review of Systems  Constitutional:  Negative for chills and fever.  HENT:  Negative for congestion.   Respiratory:  Negative for cough, chest tightness and wheezing.   Cardiovascular:  Negative for chest pain.  Gastrointestinal:  Negative for abdominal pain, blood in stool, nausea and vomiting.       See hpi  Genitourinary:  Negative for dysuria.  Musculoskeletal:  Negative for back pain.  Neurological:  Negative for dizziness, weakness and light-headedness.  Hematological:  Negative for adenopathy.  Psychiatric/Behavioral:  Negative for behavioral problems and decreased concentration.     Past Medical History:  Diagnosis Date   CAD (coronary artery disease)    Hyperlipidemia    Hypertension    Kidney stone    Retinal detachment      Social History   Socioeconomic History   Marital status: Married    Spouse name: Lonell   Number of children: 3    Years of education: Not on file   Highest education level: Not on file  Occupational History   Occupation: retired    Comment: civil service fast streamer  Tobacco Use   Smoking status: Never   Smokeless tobacco: Never  Vaping Use   Vaping status: Never Used  Substance and Sexual Activity   Alcohol use: Yes    Comment: 1 beer every 2-3 weeks   Drug use: Never   Sexual activity: Yes  Other Topics Concern   Not on file  Social History Narrative   Married, 2 daughters with current wife and 1 daughter from previous marriage   Social Drivers of Corporate Investment Banker Strain: Low Risk  (11/30/2023)   Overall Financial Resource Strain (CARDIA)    Difficulty of Paying Living Expenses: Not hard at all  Food Insecurity: No Food Insecurity (11/30/2023)   Hunger Vital Sign    Worried About Running Out of Food in the Last Year: Never true    Ran Out of Food in the Last Year: Never true  Transportation Needs: No Transportation Needs (11/30/2023)   PRAPARE - Administrator, Civil Service (Medical): No    Lack of Transportation (Non-Medical): No  Physical Activity: Sufficiently Active (11/30/2023)   Exercise Vital Sign    Days of Exercise per Week: 7 days    Minutes of Exercise per Session: 50 min  Stress: No Stress Concern Present (11/30/2023)   [employer] of Occupational Health - Occupational Stress  Questionnaire    Feeling of Stress: Not at all  Social Connections: Unknown (11/30/2023)   Social Connection and Isolation Panel    Frequency of Communication with Friends and Family: More than three times a week    Frequency of Social Gatherings with Friends and Family: More than three times a week    Attends Religious Services: Not on file    Active Member of Clubs or Organizations: No    Attends Banker Meetings: Never    Marital Status: Married  Catering Manager Violence: Not At Risk (11/30/2023)   Humiliation, Afraid, Rape, and Kick questionnaire    Fear  of Current or Ex-Partner: No    Emotionally Abused: No    Physically Abused: No    Sexually Abused: No    Past Surgical History:  Procedure Laterality Date   CORONARY STENT INTERVENTION N/A 07/22/2022   Procedure: CORONARY STENT INTERVENTION;  Surgeon: Verlin Lonni BIRCH, MD;  Location: [facility];  Service: Cardiovascular;  Laterality: N/A;   CORONARY/GRAFT ACUTE MI REVASCULARIZATION N/A 01/24/2023   Procedure: Coronary/Graft Acute MI Revascularization;  Surgeon: Verlin Lonni BIRCH, MD;  Location: [facility];  Service: Cardiovascular;  Laterality: N/A;   EYE SURGERY     LEFT HEART CATH AND CORONARY ANGIOGRAPHY N/A 07/22/2022   Procedure: LEFT HEART CATH AND CORONARY ANGIOGRAPHY;  Surgeon: Verlin Lonni BIRCH, MD;  Location: [facility];  Service: Cardiovascular;  Laterality: N/A;   LEFT HEART CATH AND CORONARY ANGIOGRAPHY N/A 01/24/2023   Procedure: LEFT HEART CATH AND CORONARY ANGIOGRAPHY;  Surgeon: Verlin Lonni BIRCH, MD;  Location: [facility];  Service: Cardiovascular;  Laterality: N/A;    Family History  Problem Relation Age of Onset   Hyperlipidemia Mother    Hypertension Mother    Heart disease Mother    Heart attack Father 47    Allergies  Allergen Reactions   Lisinopril     REACTION: cough   Nitroglycerin      Blood pressure drop   Penicillins Rash    Current Outpatient Medications on File Prior to Visit  Medication Sig Dispense Refill   aspirin  EC 81 MG tablet Take 1 tablet (81 mg total) by mouth daily. Swallow whole. 90 tablet 3   atorvastatin  (LIPITOR) 80 MG tablet Take 1 tablet (80 mg total) by mouth at bedtime. 90 tablet 1   ezetimibe  (ZETIA ) 10 MG tablet TAKE 1 TABLET BY MOUTH EVERY DAY 90 tablet 1   Iron , Ferrous Sulfate , 325 (65 Fe) MG TABS Take 1 tablet by mouth 3 (three) times daily. 100 tablet 0   metoprolol  succinate (TOPROL -XL) 50 MG 24 hr tablet TAKE 1 TABLET (50 MG TOTAL) BY MOUTH DAILY. NEEDS APPT 90  tablet 1   ondansetron  (ZOFRAN ) 4 MG tablet Take 1 tablet (4 mg total) by mouth every 8 (eight) hours as needed for nausea or vomiting. 20 tablet 0   prasugrel  (EFFIENT ) 10 MG TABS tablet Take 1 tablet (10 mg total) by mouth daily. 90 tablet 1   prednisoLONE acetate (PRED FORTE) 1 % ophthalmic suspension Place 1 drop into the left eye daily.     Risankizumab-rzaa (SKYRIZI) 150 MG/ML SOSY Inject into the skin every 3 (three) months.     sodium polystyrene (KAYEXALATE ) 15 GM/60ML suspension Take 60 ml by mouth daily for 4 days 240 mL 0   telmisartan  (MICARDIS ) 40 MG tablet Take 1 tablet (40 mg total) by mouth daily. 30 tablet 0   triamcinolone  cream (KENALOG )  0.1 % Apply 1 Application topically 2 (two) times daily. (Patient taking differently: Apply 1 Application topically 2 (two) times daily as needed (irritation).) 30 g 0   No current facility-administered medications on file prior to visit.    BP 124/89 Comment: home machine  Pulse 94   Temp 97.8 F (36.6 C) (Oral)   Resp 15   Ht 5' 4 (1.626 m)   Wt 161 lb 9.6 oz (73.3 kg)   SpO2 97%   BMI 27.74 kg/m        Objective:   Physical Exam  General Mental Status- Alert. General Appearance- Not in acute distress.   Skin General: Color- Normal Color. Moisture- Normal Moisture.  Neck Carotid Arteries- Normal color. Moisture- Normal Moisture. No carotid bruits. No JVD.  Chest and Lung Exam Auscultation: Breath Sounds:-Normal.  Cardiovascular Auscultation:Rythm- Regular. Murmurs & Other Heart Sounds:Auscultation of the heart reveals- No Murmurs.  Abdomen Inspection:-Inspeection Normal. Palpation/Percussion:Note:No mass. Palpation and Percussion of the abdomen reveal- Non Tender, Non Distended + BS, no rebound or guarding.    Neurologic Cranial Nerve exam:- CN III-XII intact(No nystagmus), symmetric smile. Strength:- 5/5 equal and symmetric strength both upper and lower extremities.       Assessment & Plan:   Patient  Instructions  Cholelithiasis with possible biliary obstruction Gallstone 10 mm with possible some degree biliary obstruction? Elevated lipase suggests possible pancreatitis. - Referred to gastroenterologist for evaluation and possible ERCP. - Referred to surgeon for potential surgical intervention to remove gallbladder. - Repeated lipase level to assess for pancreatitis. - Advised to avoid fatty and fried foods, consume soft foods like bananas, fruits, and bland foods  Anemia under evaluation for gastrointestinal bleeding Anemia with hemoglobin 8.3 and hematocrit 27. Possible gastrointestinal bleeding? - Ordered CBC and metabolic panel to assess anemia status. - Checked stool card for occult blood. Ifob dropped off today -continue iron  since iron  panel levels are low.  Abnormal pancreatic imaging MRI shows dilated area near pancreas head, possibly a mass. - Referred to gastroenterologist for further evaluation.  Hypertension Blood pressure within acceptable range. - Continue current meds the same.  Pancreatitis recently -possible associated with gallstone and possible mass.  -dietary advise given -rx zofan. GI referral pending.  Follow up date to be determined after lab review and please update when specialist get you scheduled. I am hoping you can see both within 7-10 days.      Referring To Provider Information [facility] [address] [place] [address] [place] [address] [phone]   Referral Start Date: 03/21/2024 Referral End Date: 03/21/2025    Referring To Provider Information [facility] [address] [place] [address] [phone]   Referral Start Date: 03/21/2024 Referral End Date: 03/21/2025         I personally spent a total of in the care of the patient today including performing a medically appropriate exam/evaluation, placing orders, referring and communicating with other health care professionals, and  documenting clinical information in the EHR.

## 2024-04-03 NOTE — Patient Instructions (Addendum)
 Cholelithiasis with possible biliary obstruction Gallstone 10 mm with possible some degree biliary obstruction? Elevated lipase suggests possible pancreatitis. - Referred to gastroenterologist for evaluation and possible ERCP. - Referred to surgeon for potential surgical intervention to remove gallbladder. - Repeated lipase level to assess for pancreatitis. - Advised to avoid fatty and fried foods, consume soft foods like bananas, fruits, and bland foods  Anemia under evaluation for gastrointestinal bleeding Anemia with hemoglobin 8.3 and hematocrit 27. Possible gastrointestinal bleeding? - Ordered CBC and metabolic panel to assess anemia status. - Checked stool card for occult blood. Ifob dropped off today -continue iron  since iron  panel levels are low.  Abnormal pancreatic imaging MRI shows dilated area near pancreas head, possibly a mass. - Referred to gastroenterologist for further evaluation.  Hypertension Blood pressure within acceptable range. - Continue current meds the same.  Pancreatitis recently(acute) -possible associated with gallstone and possible mass.  -dietary advise given -rx zofan. GI referral pending.  Follow up date to be determined after lab review and please update when specialist get you scheduled. I am hoping you can see both within 7-10 days.      Referring To Provider [facility] [address] [place] [address] [place] [address] [phone]   Referral Start Date: 03/21/2024 Referral End Date: 03/21/2025    Referring To Provider Information [facility] [address] [place] [address] [phone]   Referral Start Date: 03/21/2024 Referral End Date: 03/21/2025

## 2024-04-04 ENCOUNTER — Other Ambulatory Visit: Payer: Self-pay | Admitting: Medical

## 2024-04-04 LAB — FECAL OCCULT BLOOD, IMMUNOCHEMICAL: Fecal Occult Bld: POSITIVE — AB

## 2024-04-04 NOTE — Progress Notes (Signed)
 Patient notified of results, he is schedule to see GS Thursday and is waiting for call back from GI

## 2024-04-10 ENCOUNTER — Encounter: Payer: Self-pay | Admitting: Physician Assistant

## 2024-04-13 NOTE — H&P (View-Only) (Signed)
 Chief Complaint: Abnormal imaging of the pancreas and gallbladder, anemia  HPI:    Bryan Craig is a 73 year old Hispanic male with past medical history as listed below including CAD on Effient  and cardiomyopathy (05/11/2023 echo with LVEF 44% and grade 1 diastolic dysfunction), who was referred to me by Dorina Loving, PA-C for a complaint of gallbladder polyp.      03/21/2024 right upper quadrant ultrasound done for elevated LFTs showed substantial abnormal new biliary and dorsal pancreatic duct dilation, cannot exclude pancreatic mass, ampullary mass or obstruction ampullary region, recommended MRCP.  Moderately distended gallbladder with polypoid lesions, gallbladder sludge and a suspected 10 mm gallstone near the gallbladder neck versus likely large polyp, mildly echogenic liver with coarsened echotexture likely from hepatic steatosis.    03/28/2024 MRCP with a focal 1 cm length distal common bile duct stricture near the upper pancreatic head resulting in proximal biliary dilation, also 1 cm stricture in the dorsal pancreatic duct in the pancreatic head, low-level Perry pancreatic edema suspicious for pancreatitis.  Recommended correlation with lipase.  No well-defined mass.  Recommended EUS and/or ERCP.  Trace peripancreatic edema possibly reflecting mild pancreatitis.  Mildly accentuated gastric mucosal enhancement could be seen with gastritis.    04/03/2024 lipase 132.  CBC with a hemoglobin of 8.8, MCV low at 61.1.  Fecal occult blood positive.  CMP with a sodium of 134, alk phos 833, AST 294, ALT 397, increased over the past month.  Initially AST 171 and ALT 251.  Bili has remained the same.    04/06/2024 patient seen by general surgery.  At that time discussed some fatigue and constipation with significant weight loss of about 18 pounds over the past year.  At that time labs sent including a CEA and CA 19-9.  Past Medical History:  Diagnosis Date   CAD (coronary artery disease)     Hyperlipidemia    Hypertension    Kidney stone    Retinal detachment     Past Surgical History:  Procedure Laterality Date   CORONARY STENT INTERVENTION N/A 07/22/2022   Procedure: CORONARY STENT INTERVENTION;  Surgeon: Verlin Lonni BIRCH, MD;  [facility]: [facility];  Service: Cardiovascular;  Laterality: N/A;   CORONARY/GRAFT ACUTE MI REVASCULARIZATION N/A 01/24/2023   Procedure: Coronary/Graft Acute MI Revascularization;  Surgeon: Verlin Lonni BIRCH, MD;  Location: [facility];  Service: Cardiovascular;  Laterality: N/A;   EYE SURGERY     LEFT HEART CATH AND CORONARY ANGIOGRAPHY N/A 07/22/2022   Procedure: LEFT HEART CATH AND CORONARY ANGIOGRAPHY;  Surgeon: Verlin Lonni BIRCH, MD;  [facility]: [facility];  Service: Cardiovascular;  Laterality: N/A;   LEFT HEART CATH AND CORONARY ANGIOGRAPHY N/A 01/24/2023   Procedure: LEFT HEART CATH AND CORONARY ANGIOGRAPHY;  Surgeon: Verlin Lonni BIRCH, MD;  [facility]: [facility];  Service: Cardiovascular;  Laterality: N/A;    Current Outpatient Medications  Medication Sig Dispense Refill   aspirin  EC 81 MG tablet Take 1 tablet (81 mg total) by mouth daily. Swallow whole. 90 tablet 3   atorvastatin  (LIPITOR) 80 MG tablet Take 1 tablet (80 mg total) by mouth at bedtime. 90 tablet 1   ezetimibe  (ZETIA ) 10 MG tablet TAKE 1 TABLET BY MOUTH EVERY DAY 90 tablet 1   Iron , Ferrous Sulfate , 325 (65 Fe) MG TABS Take 1 tablet by mouth 3 (three) times daily. 100 tablet 0   metoprolol  succinate (TOPROL -XL) 50 MG 24 hr tablet TAKE 1 TABLET (50 MG TOTAL) BY  MOUTH DAILY. NEEDS APPT 90 tablet 1   ondansetron  (ZOFRAN ) 4 MG tablet Take 1 tablet (4 mg total) by mouth every 8 (eight) hours as needed for nausea or vomiting. 20 tablet 0   prasugrel  (EFFIENT ) 10 MG TABS tablet Take 1 tablet (10 mg total) by mouth daily. 90 tablet 1   prednisoLONE acetate (PRED FORTE) 1 % ophthalmic suspension Place 1 drop into the left eye daily.      Risankizumab-rzaa (SKYRIZI) 150 MG/ML SOSY Inject into the skin every 3 (three) months.     sodium polystyrene (KAYEXALATE ) 15 GM/60ML suspension Take 60 ml by mouth daily for 4 days 240 mL 0   telmisartan  (MICARDIS ) 40 MG tablet TAKE 1 TABLET BY MOUTH EVERY DAY 90 tablet 1   triamcinolone  cream (KENALOG ) 0.1 % Apply 1 Application topically 2 (two) times daily. (Patient taking differently: Apply 1 Application topically 2 (two) times daily as needed (irritation).) 30 g 0   No current facility-administered medications for this visit.    Allergies as of 04/14/2024 - Review Complete 04/03/2024  Allergen Reaction Noted   Lisinopril  06/19/2010   Nitroglycerin   01/24/2023   Penicillins Rash 06/19/2010    Family History  Problem Relation Age of Onset   Hyperlipidemia Mother    Hypertension Mother    Heart disease Mother    Heart attack Father 59    Social History   Socioeconomic History   Marital status: Married    Spouse name: Lonell   Number of children: 3   Years of education: Not on file   Highest education level: Not on file  Occupational History   Occupation: retired    Comment: civil service fast streamer  Tobacco Use   Smoking status: Never   Smokeless tobacco: Never  Vaping Use   Vaping status: Never Used  Substance and Sexual Activity   Alcohol use: Yes    Comment: 1 beer every 2-3 weeks   Drug use: Never   Sexual activity: Yes  Other Topics Concern   Not on file  Social History Narrative   Married, 2 daughters with current wife and 1 daughter from previous marriage   Social Drivers of Health   Tobacco Use: Low Risk  (04/06/2024)   Received from [facility]   Patient History    Smoking Tobacco Use: Never    Smokeless Tobacco Use: Never    Passive Exposure: Not on file  Financial Resource Strain: Low Risk (11/30/2023)   Overall Financial Resource Strain (CARDIA)    Difficulty of Paying Living Expenses: Not hard at all  Food Insecurity:  No Food Insecurity (11/30/2023)   Epic    Worried About Radiation Protection Practitioner of Food in the Last Year: Never true    Ran Out of Food in the Last Year: Never true  Transportation Needs: No Transportation Needs (11/30/2023)   Epic    Lack of Transportation (Medical): No    Lack of Transportation (Non-Medical): No  Physical Activity: Sufficiently Active (11/30/2023)   Exercise Vital Sign    Days of Exercise per Week: 7 days    Minutes of Exercise per Session: 50 min  Stress: No Stress Concern Present (11/30/2023)   [employer] of Occupational Health - Occupational Stress Questionnaire    Feeling of Stress: Not at all  Social Connections: Unknown (11/30/2023)   Social Connection and Isolation Panel    Frequency of Communication with Friends and Family: More than three times a week    Frequency of Social Gatherings  with Friends and Family: More than three times a week    Attends Religious Services: Not on file    Active Member of Clubs or Organizations: No    Attends Banker Meetings: Never    Marital Status: Married  Catering Manager Violence: Not At Risk (11/30/2023)   Epic    Fear of Current or Ex-Partner: No    Emotionally Abused: No    Physically Abused: No    Sexually Abused: No  Depression (PHQ2-9): Low Risk (11/30/2023)   Depression (PHQ2-9)    PHQ-2 Score: 0  Alcohol Screen: Low Risk (11/30/2023)   Alcohol Screen    Last Alcohol Screening Score (AUDIT): 2  Housing: Unknown (04/06/2024)   Received from [facility]   Epic    Unable to Pay for Housing in the Last Year: Not on file    Number of Times Moved in the Last Year: Not on file    At any time in the past 12 months, were you homeless or living in a shelter (including now)?: No  Utilities: Not At Risk (11/30/2023)   Epic    Threatened with loss of utilities: No  Health Literacy: Adequate Health Literacy (11/30/2023)   B1300 Health Literacy    Frequency of need for help with medical instructions: Never     Review of Systems:    Constitutional: No weight loss, fever, chills, weakness or fatigue HEENT: Eyes: No change in vision               Ears, Nose, Throat:  No change in hearing or congestion Skin: No rash or itching Cardiovascular: No chest pain, chest pressure or palpitations   Respiratory: No SOB or cough Gastrointestinal: See HPI and otherwise negative Genitourinary: No dysuria or change in urinary frequency Neurological: No headache, dizziness or syncope Musculoskeletal: No new muscle or joint pain Hematologic: No bleeding or bruising Psychiatric: No history of depression or anxiety    Physical Exam:  Vital signs: There were no vitals taken for this visit.  Constitutional:   Pleasant Caucasian male appears to be in NAD, Well developed, Well nourished, alert and cooperative Head:  Normocephalic and atraumatic. Eyes:   PEERL, EOMI. No icterus. Conjunctiva pink. Ears:  Normal auditory acuity. Neck:  Supple Throat: Oral cavity and pharynx without inflammation, swelling or lesion.  Respiratory: Respirations even and unlabored. Lungs clear to auscultation bilaterally.   No wheezes, crackles, or rhonchi.  Cardiovascular: Normal S1, S2. No MRG. Regular rate and rhythm. No peripheral edema, cyanosis or pallor.  Gastrointestinal:  Soft, nondistended, nontender. No rebound or guarding. Normal bowel sounds. No appreciable masses or hepatomegaly. Rectal:  Not performed.  Msk:  Symmetrical without gross deformities. Without edema, no deformity or joint abnormality.  Neurologic:  Alert and  oriented x4;  grossly normal neurologically.  Skin:   Dry and intact without significant lesions or rashes. Psychiatric: Oriented to person, place and time. Demonstrates good judgement and reason without abnormal affect or behaviors.  RELEVANT LABS AND IMAGING: CBC    Component Value Date/Time   WBC 4.3 04/03/2024 1117   RBC 4.77 04/03/2024 1117   HGB 8.8 Repeated and verified X2. (L)  04/03/2024 1117   HCT 29.1 (L) 04/03/2024 1117   PLT 474.0 (H) 04/03/2024 1117   MCV 61.1 Repeated and verified X2. (L) 04/03/2024 1117   MCH 29.4 01/25/2023 0318   MCHC 30.2 04/03/2024 1117   RDW 21.2 (H) 04/03/2024 1117   LYMPHSABS 1.6 03/13/2024 9196  MONOABS 0.4 03/13/2024 0803   EOSABS 0.2 03/13/2024 0803   BASOSABS 0.0 03/13/2024 0803    CMP     Component Value Date/Time   NA 134 (L) 04/03/2024 1117   K 4.2 04/03/2024 1117   CL 100 04/03/2024 1117   CO2 28 04/03/2024 1117   GLUCOSE 96 04/03/2024 1117   BUN 11 04/03/2024 1117   CREATININE 0.70 04/03/2024 1117   CREATININE 0.71 03/09/2024 1201   CALCIUM  8.9 04/03/2024 1117   PROT 6.2 04/03/2024 1117   PROT 6.8 09/04/2022 0836   ALBUMIN 3.6 04/03/2024 1117   ALBUMIN 4.5 09/04/2022 0836   AST 294 (H) 04/03/2024 1117   ALT 397 (H) 04/03/2024 1117   ALKPHOS 833 (H) 04/03/2024 1117   BILITOT 0.7 04/03/2024 1117   BILITOT 0.5 09/04/2022 0836   GFRNONAA >60 01/26/2023 0503   GFRAA 60 (L) 04/06/2014 0420    Assessment: 1.  Abnormal imaging of the pancreas and gallbladder: 2.  Anemia:  Plan: 1. ***     Delon Failing, PA-C [facility] 04/13/2024, 3:50 PM  Cc: Saguier, Edward, PA-C

## 2024-04-13 NOTE — Progress Notes (Unsigned)
 Chief Complaint: Abnormal imaging of the pancreas and gallbladder, anemia  HPI:    Bryan Craig is a 73 year old Hispanic male with past medical history as listed below including CAD on Effient  and cardiomyopathy (05/11/2023 echo with LVEF 44% and grade 1 diastolic dysfunction), who was referred to me by Dorina Loving, PA-C for a complaint of gallbladder polyp.      03/21/2024 right upper quadrant ultrasound done for elevated LFTs showed substantial abnormal new biliary and dorsal pancreatic duct dilation, cannot exclude pancreatic mass, ampullary mass or obstruction ampullary region, recommended MRCP.  Moderately distended gallbladder with polypoid lesions, gallbladder sludge and a suspected 10 mm gallstone near the gallbladder neck versus likely large polyp, mildly echogenic liver with coarsened echotexture likely from hepatic steatosis.    03/28/2024 MRCP with a focal 1 cm length distal common bile duct stricture near the upper pancreatic head resulting in proximal biliary dilation, also 1 cm stricture in the dorsal pancreatic duct in the pancreatic head, low-level Perry pancreatic edema suspicious for pancreatitis.  Recommended correlation with lipase.  No well-defined mass.  Recommended EUS and/or ERCP.  Trace peripancreatic edema possibly reflecting mild pancreatitis.  Mildly accentuated gastric mucosal enhancement could be seen with gastritis.    04/03/2024 lipase 132.  CBC with a hemoglobin of 8.8, MCV low at 61.1.  Fecal occult blood positive.  CMP with a sodium of 134, alk phos 833, AST 294, ALT 397, increased over the past month.  Initially AST 171 and ALT 251.  Bili has remained the same.    04/06/2024 patient seen by general surgery.  At that time discussed some fatigue and constipation with significant weight loss of about 18 pounds over the past year.  At that time labs sent including a CEA and CA 19-9.  Past Medical History:  Diagnosis Date   CAD (coronary artery disease)     Hyperlipidemia    Hypertension    Kidney stone    Retinal detachment     Past Surgical History:  Procedure Laterality Date   CORONARY STENT INTERVENTION N/A 07/22/2022   Procedure: CORONARY STENT INTERVENTION;  Surgeon: Verlin Lonni BIRCH, MD;  Location: [facility];  Service: Cardiovascular;  Laterality: N/A;   CORONARY/GRAFT ACUTE MI REVASCULARIZATION N/A 01/24/2023   Procedure: Coronary/Graft Acute MI Revascularization;  Surgeon: Verlin Lonni BIRCH, MD;  Location: [facility];  Service: Cardiovascular;  Laterality: N/A;   EYE SURGERY     LEFT HEART CATH AND CORONARY ANGIOGRAPHY N/A 07/22/2022   Procedure: LEFT HEART CATH AND CORONARY ANGIOGRAPHY;  Surgeon: Verlin Lonni BIRCH, MD;  Location: [facility];  Service: Cardiovascular;  Laterality: N/A;   LEFT HEART CATH AND CORONARY ANGIOGRAPHY N/A 01/24/2023   Procedure: LEFT HEART CATH AND CORONARY ANGIOGRAPHY;  Surgeon: Verlin Lonni BIRCH, MD;  Location: [facility];  Service: Cardiovascular;  Laterality: N/A;    Current Outpatient Medications  Medication Sig Dispense Refill   aspirin  EC 81 MG tablet Take 1 tablet (81 mg total) by mouth daily. Swallow whole. 90 tablet 3   atorvastatin  (LIPITOR) 80 MG tablet Take 1 tablet (80 mg total) by mouth at bedtime. 90 tablet 1   ezetimibe  (ZETIA ) 10 MG tablet TAKE 1 TABLET BY MOUTH EVERY DAY 90 tablet 1   Iron , Ferrous Sulfate , 325 (65 Fe) MG TABS Take 1 tablet by mouth 3 (three) times daily. 100 tablet 0   metoprolol  succinate (TOPROL -XL) 50 MG 24 hr tablet TAKE 1 TABLET (50 MG TOTAL) BY  MOUTH DAILY. NEEDS APPT 90 tablet 1   ondansetron  (ZOFRAN ) 4 MG tablet Take 1 tablet (4 mg total) by mouth every 8 (eight) hours as needed for nausea or vomiting. 20 tablet 0   prasugrel  (EFFIENT ) 10 MG TABS tablet Take 1 tablet (10 mg total) by mouth daily. 90 tablet 1   prednisoLONE acetate (PRED FORTE) 1 % ophthalmic suspension Place 1 drop into the left eye daily.      Risankizumab-rzaa (SKYRIZI) 150 MG/ML SOSY Inject into the skin every 3 (three) months.     sodium polystyrene (KAYEXALATE ) 15 GM/60ML suspension Take 60 ml by mouth daily for 4 days 240 mL 0   telmisartan  (MICARDIS ) 40 MG tablet TAKE 1 TABLET BY MOUTH EVERY DAY 90 tablet 1   triamcinolone  cream (KENALOG ) 0.1 % Apply 1 Application topically 2 (two) times daily. (Patient taking differently: Apply 1 Application topically 2 (two) times daily as needed (irritation).) 30 g 0   No current facility-administered medications for this visit.    Allergies as of 04/14/2024 - Review Complete 04/03/2024  Allergen Reaction Noted   Lisinopril  06/19/2010   Nitroglycerin   01/24/2023   Penicillins Rash 06/19/2010    Family History  Problem Relation Age of Onset   Hyperlipidemia Mother    Hypertension Mother    Heart disease Mother    Heart attack Father 59    Social History   Socioeconomic History   Marital status: Married    Spouse name: Lonell   Number of children: 3   Years of education: Not on file   Highest education level: Not on file  Occupational History   Occupation: retired    Comment: civil service fast streamer  Tobacco Use   Smoking status: Never   Smokeless tobacco: Never  Vaping Use   Vaping status: Never Used  Substance and Sexual Activity   Alcohol use: Yes    Comment: 1 beer every 2-3 weeks   Drug use: Never   Sexual activity: Yes  Other Topics Concern   Not on file  Social History Narrative   Married, 2 daughters with current wife and 1 daughter from previous marriage   Social Drivers of Health   Tobacco Use: Low Risk  (04/06/2024)   Received from [facility]   Patient History    Smoking Tobacco Use: Never    Smokeless Tobacco Use: Never    Passive Exposure: Not on file  Financial Resource Strain: Low Risk (11/30/2023)   Overall Financial Resource Strain (CARDIA)    Difficulty of Paying Living Expenses: Not hard at all  Food Insecurity:  No Food Insecurity (11/30/2023)   Epic    Worried About Radiation Protection Practitioner of Food in the Last Year: Never true    Ran Out of Food in the Last Year: Never true  Transportation Needs: No Transportation Needs (11/30/2023)   Epic    Lack of Transportation (Medical): No    Lack of Transportation (Non-Medical): No  Physical Activity: Sufficiently Active (11/30/2023)   Exercise Vital Sign    Days of Exercise per Week: 7 days    Minutes of Exercise per Session: 50 min  Stress: No Stress Concern Present (11/30/2023)   [employer] of Occupational Health - Occupational Stress Questionnaire    Feeling of Stress: Not at all  Social Connections: Unknown (11/30/2023)   Social Connection and Isolation Panel    Frequency of Communication with Friends and Family: More than three times a week    Frequency of Social Gatherings  with Friends and Family: More than three times a week    Attends Religious Services: Not on file    Active Member of Clubs or Organizations: No    Attends Banker Meetings: Never    Marital Status: Married  Catering Manager Violence: Not At Risk (11/30/2023)   Epic    Fear of Current or Ex-Partner: No    Emotionally Abused: No    Physically Abused: No    Sexually Abused: No  Depression (PHQ2-9): Low Risk (11/30/2023)   Depression (PHQ2-9)    PHQ-2 Score: 0  Alcohol Screen: Low Risk (11/30/2023)   Alcohol Screen    Last Alcohol Screening Score (AUDIT): 2  Housing: Unknown (04/06/2024)   Received from [facility]   Epic    Unable to Pay for Housing in the Last Year: Not on file    Number of Times Moved in the Last Year: Not on file    At any time in the past 12 months, were you homeless or living in a shelter (including now)?: No  Utilities: Not At Risk (11/30/2023)   Epic    Threatened with loss of utilities: No  Health Literacy: Adequate Health Literacy (11/30/2023)   B1300 Health Literacy    Frequency of need for help with medical instructions: Never     Review of Systems:    Constitutional: No weight loss, fever, chills, weakness or fatigue HEENT: Eyes: No change in vision               Ears, Nose, Throat:  No change in hearing or congestion Skin: No rash or itching Cardiovascular: No chest pain, chest pressure or palpitations   Respiratory: No SOB or cough Gastrointestinal: See HPI and otherwise negative Genitourinary: No dysuria or change in urinary frequency Neurological: No headache, dizziness or syncope Musculoskeletal: No new muscle or joint pain Hematologic: No bleeding or bruising Psychiatric: No history of depression or anxiety    Physical Exam:  Vital signs: There were no vitals taken for this visit.  Constitutional:   Pleasant Caucasian male appears to be in NAD, Well developed, Well nourished, alert and cooperative Head:  Normocephalic and atraumatic. Eyes:   PEERL, EOMI. No icterus. Conjunctiva pink. Ears:  Normal auditory acuity. Neck:  Supple Throat: Oral cavity and pharynx without inflammation, swelling or lesion.  Respiratory: Respirations even and unlabored. Lungs clear to auscultation bilaterally.   No wheezes, crackles, or rhonchi.  Cardiovascular: Normal S1, S2. No MRG. Regular rate and rhythm. No peripheral edema, cyanosis or pallor.  Gastrointestinal:  Soft, nondistended, nontender. No rebound or guarding. Normal bowel sounds. No appreciable masses or hepatomegaly. Rectal:  Not performed.  Msk:  Symmetrical without gross deformities. Without edema, no deformity or joint abnormality.  Neurologic:  Alert and  oriented x4;  grossly normal neurologically.  Skin:   Dry and intact without significant lesions or rashes. Psychiatric: Oriented to person, place and time. Demonstrates good judgement and reason without abnormal affect or behaviors.  RELEVANT LABS AND IMAGING: CBC    Component Value Date/Time   WBC 4.3 04/03/2024 1117   RBC 4.77 04/03/2024 1117   HGB 8.8 Repeated and verified X2. (L)  04/03/2024 1117   HCT 29.1 (L) 04/03/2024 1117   PLT 474.0 (H) 04/03/2024 1117   MCV 61.1 Repeated and verified X2. (L) 04/03/2024 1117   MCH 29.4 01/25/2023 0318   MCHC 30.2 04/03/2024 1117   RDW 21.2 (H) 04/03/2024 1117   LYMPHSABS 1.6 03/13/2024 9196  MONOABS 0.4 03/13/2024 0803   EOSABS 0.2 03/13/2024 0803   BASOSABS 0.0 03/13/2024 0803    CMP     Component Value Date/Time   NA 134 (L) 04/03/2024 1117   K 4.2 04/03/2024 1117   CL 100 04/03/2024 1117   CO2 28 04/03/2024 1117   GLUCOSE 96 04/03/2024 1117   BUN 11 04/03/2024 1117   CREATININE 0.70 04/03/2024 1117   CREATININE 0.71 03/09/2024 1201   CALCIUM  8.9 04/03/2024 1117   PROT 6.2 04/03/2024 1117   PROT 6.8 09/04/2022 0836   ALBUMIN 3.6 04/03/2024 1117   ALBUMIN 4.5 09/04/2022 0836   AST 294 (H) 04/03/2024 1117   ALT 397 (H) 04/03/2024 1117   ALKPHOS 833 (H) 04/03/2024 1117   BILITOT 0.7 04/03/2024 1117   BILITOT 0.5 09/04/2022 0836   GFRNONAA >60 01/26/2023 0503   GFRAA 60 (L) 04/06/2014 0420    Assessment: 1.  Abnormal imaging of the pancreas and gallbladder: 2.  Anemia:  Plan: 1. ***     Delon Failing, PA-C [facility] 04/13/2024, 3:50 PM  Cc: Saguier, Edward, PA-C

## 2024-04-14 ENCOUNTER — Other Ambulatory Visit

## 2024-04-14 ENCOUNTER — Encounter: Payer: Self-pay | Admitting: Physician Assistant

## 2024-04-14 ENCOUNTER — Ambulatory Visit: Admitting: Physician Assistant

## 2024-04-14 VITALS — BP 110/60 | HR 84 | Ht 64.0 in | Wt 153.0 lb

## 2024-04-14 DIAGNOSIS — R748 Abnormal levels of other serum enzymes: Secondary | ICD-10-CM | POA: Diagnosis not present

## 2024-04-14 DIAGNOSIS — R7989 Other specified abnormal findings of blood chemistry: Secondary | ICD-10-CM

## 2024-04-14 DIAGNOSIS — K838 Other specified diseases of biliary tract: Secondary | ICD-10-CM

## 2024-04-14 DIAGNOSIS — R9389 Abnormal findings on diagnostic imaging of other specified body structures: Secondary | ICD-10-CM

## 2024-04-14 DIAGNOSIS — D509 Iron deficiency anemia, unspecified: Secondary | ICD-10-CM

## 2024-04-14 LAB — COMPREHENSIVE METABOLIC PANEL WITH GFR
ALT: 418 U/L — ABNORMAL HIGH (ref 3–53)
AST: 327 U/L — ABNORMAL HIGH (ref 5–37)
Albumin: 3.7 g/dL (ref 3.5–5.2)
Alkaline Phosphatase: 1016 U/L — ABNORMAL HIGH (ref 39–117)
BUN: 13 mg/dL (ref 6–23)
CO2: 30 meq/L (ref 19–32)
Calcium: 9.1 mg/dL (ref 8.4–10.5)
Chloride: 102 meq/L (ref 96–112)
Creatinine, Ser: 0.65 mg/dL (ref 0.40–1.50)
GFR: 92.73 mL/min
Glucose, Bld: 99 mg/dL (ref 70–99)
Potassium: 4.1 meq/L (ref 3.5–5.1)
Sodium: 137 meq/L (ref 135–145)
Total Bilirubin: 0.9 mg/dL (ref 0.2–1.2)
Total Protein: 5.7 g/dL — ABNORMAL LOW (ref 6.0–8.3)

## 2024-04-14 LAB — CBC WITH DIFFERENTIAL/PLATELET
Basophils Relative: 0 % (ref 0.0–3.0)
Eosinophils Relative: 3 % (ref 0.0–5.0)
HCT: 30.3 % — ABNORMAL LOW (ref 39.0–52.0)
Hemoglobin: 9.2 g/dL — ABNORMAL LOW (ref 13.0–17.0)
Lymphocytes Relative: 34 % (ref 12.0–46.0)
MCHC: 30.4 g/dL (ref 30.0–36.0)
MCV: 63.5 fl — ABNORMAL LOW (ref 78.0–100.0)
Monocytes Relative: 10 % (ref 3.0–12.0)
Neutrophils Relative %: 52 % (ref 43.0–77.0)
Platelets: 468 K/uL — ABNORMAL HIGH (ref 150.0–400.0)
RBC: 4.77 Mil/uL (ref 4.22–5.81)
RDW: 24.6 % — ABNORMAL HIGH (ref 11.5–15.5)
WBC: 4.4 K/uL (ref 4.0–10.5)

## 2024-04-14 LAB — LIPASE: Lipase: 175 U/L — ABNORMAL HIGH (ref 11.0–59.0)

## 2024-04-14 NOTE — Patient Instructions (Signed)
 Your provider has requested that you go to the basement level for lab work before leaving today. Press B on the elevator. The lab is located at the first door on the left as you exit the elevator.  Will call back after talking to Dr. Wilhelmenia with next steps.   _______________________________________________________  If your blood pressure at your visit was 140/90 or greater, please contact your primary care physician to follow up on this.  _______________________________________________________  If you are age 74 or older, your body mass index should be between 23-30. Your Body mass index is 26.26 kg/m. If this is out of the aforementioned range listed, please consider follow up with your Primary Care Provider.  If you are age 21 or younger, your body mass index should be between 19-25. Your Body mass index is 26.26 kg/m. If this is out of the aformentioned range listed, please consider follow up with your Primary Care Provider.   ________________________________________________________  The [facility] providers would like to encourage you to use MYCHART to communicate with providers for non-urgent requests or questions.  Due to long hold times on the telephone, sending your provider a message by [facility] may be a faster and more efficient way to get a response.  Please allow 48 business hours for a response.  Please remember that this is for non-urgent requests.  _______________________________________________________  [facility] is using a team-based approach to care.  Your team is made up of your doctor and two to three APPS. Our APPS (Nurse Practitioners and Physician Assistants) work with your physician to ensure care continuity for you. They are fully qualified to address your health concerns and develop a treatment plan. They communicate directly with your gastroenterologist to care for you. Seeing the Advanced Practice Practitioners on your physician's team can help you  by facilitating care more promptly, often allowing for earlier appointments, access to diagnostic testing, procedures, and other specialty referrals.

## 2024-04-15 NOTE — Progress Notes (Signed)
"   Attending Physician's Attestation   I have reviewed the chart.   I agree with the Advanced Practitioner's note, impression, and recommendations with any updates as below. This patient needs EUS +/- ERCP for developing biliary obstruction.  Case had been discussed with Dr. Polly previously offline as well as another GI provider and the plan had been for him to see that provider, since earlier availability for procedures was possible.  At this point in time, I just cannot accommodate nor have the capacity to perform either of these procedures until the last week of January.  He is on antiPLT therapy as well and would need holding of any of his blood thinners as well.   I will have my team go ahead and place him on for EUS/ERCP for my next available slot (again will be end of January during my hospital week).  I will get approval for antiPLT therapy hold from his Cardiology team. However, in the interests of the patient, he should also be referred to [facility] or [facility] or [facility] (whichever his insurance will allow) to try and get an EUS/ERCP completed more expeditiously).  If they cannot accommodate this, then he will have my slot available. Should the patient develop jaundice with or without fevers, it is recommended that he come to the ED for further evaluation and possible admission for endoscopic procedures to be performed as able.   Aloha Finner, MD [facility] Advanced [facility]oscopy Office # [phone]  "

## 2024-04-16 NOTE — Addendum Note (Signed)
 Addended by: DORINA DALLAS HERO on: 04/16/2024 03:16 PM   Modules accepted: Orders

## 2024-04-17 ENCOUNTER — Telehealth: Payer: Self-pay

## 2024-04-17 ENCOUNTER — Other Ambulatory Visit: Payer: Self-pay

## 2024-04-17 DIAGNOSIS — K831 Obstruction of bile duct: Secondary | ICD-10-CM

## 2024-04-17 LAB — CANCER ANTIGEN 19-9: CA 19-9: 356 U/mL — ABNORMAL HIGH

## 2024-04-17 LAB — AFP TUMOR MARKER: AFP-Tumor Marker: 1.6 ng/mL

## 2024-04-17 NOTE — Telephone Encounter (Signed)
 Message -----  From: Court Dorn PARAS, MD  Sent: 04/15/2024   9:41 AM EST  To: Aloha Wilhelmenia Raddle., MD   It's been over a year since his revasc so I don't have a problem for him to hold his anti platelet agents Gab   JJB

## 2024-04-17 NOTE — Telephone Encounter (Signed)
 ERCP EUS has been added for 05/25/24 at 8 am at [facility].  Information sent to the pt via My Chart and mail.    Left message on machine to call back

## 2024-04-17 NOTE — Telephone Encounter (Signed)
 Author: [name], [name], MD Service: Gastroenterology Author Type: Physician  Filed: 04/15/2024  6:20 AM Encounter Date: 04/14/2024 Status: Signed  Editor: [name], [name], MD (Physician)    Attending Physician's Attestation    I have reviewed the chart.    I agree with the Advanced Practitioner's note, impression, and recommendations with any updates as below. This patient needs EUS +/- ERCP for developing biliary obstruction.  Case had been discussed with Dr. Polly previously offline as well as another GI provider and the plan had been for him to see that provider, since earlier availability for procedures was possible.  At this point in time, I just cannot accommodate nor have the capacity to perform either of these procedures until the last week of January.  He is on antiPLT therapy as well and would need holding of any of his blood thinners as well.   I will have my team go ahead and place him on for EUS/ERCP for my next available slot (again will be end of January during my hospital week).  I will get approval for antiPLT therapy hold from his Cardiology team. However, in the interests of the patient, he should also be referred to [facility] or [facility] or [facility] (whichever his insurance will allow) to try and get an EUS/ERCP completed more expeditiously).  If they cannot accommodate this, then he will have my slot available. Should the patient develop jaundice with or without fevers, it is recommended that he come to the ED for further evaluation and possible admission for endoscopic procedures to be performed as able.     Aloha Finner, MD [facility] Advanced [facility] Office # [phone]

## 2024-04-17 NOTE — Telephone Encounter (Signed)
 Instructions have been sent to the pt via My Chart in Spanish and English

## 2024-04-18 ENCOUNTER — Ambulatory Visit: Payer: Self-pay | Admitting: Physician Assistant

## 2024-04-18 NOTE — Telephone Encounter (Signed)
 The pt has viewed the instructions on My Chart

## 2024-04-19 ENCOUNTER — Encounter (HOSPITAL_COMMUNITY): Payer: Self-pay | Admitting: Gastroenterology

## 2024-04-19 NOTE — Progress Notes (Addendum)
 Anesthesia Review:  PCP: Edward Saguier LOV 04/03/24  Cardiologist : Ethel Lesches LVO 05/11/23   PPM/ ICD: Device Orders: Rep Notified:  Chest x-ray : EKG : 02/03/23  Echo : 06/15/23  Stress test: Cardiac Cath :  01/24/23   Activity level:  Sleep Study/ CPAP : none  Fasting Blood Sugar :      / Checks Blood Sugar -- times a day:    Blood Thinner/ Instructions /Last Dose: ASA / Instructions/ Last Dose :    Effient - last dose 04/26/2024 per wife and pt    04/14/24- labs    04/19/2024- med hx and preprocedure instructons completed on 04/19/2024. PT understands and speaks English but would oike interpreter for day of procedure.  For some terms. Wife, Lonell,  on phone at time of preprocedure Phone Call

## 2024-04-30 NOTE — Anesthesia Preprocedure Evaluation (Signed)
"                                    Anesthesia Evaluation  Patient identified by MRN, date of birth, ID band Patient awake    Reviewed: Allergy & Precautions, NPO status , Patient's Chart, lab work & pertinent test results, reviewed documented beta blocker date and time   History of Anesthesia Complications Negative for: history of anesthetic complications  Airway Mallampati: II  TM Distance: >3 FB Neck ROM: Full    Dental  (+) Edentulous Lower, Edentulous Upper   Pulmonary neg pulmonary ROS   Pulmonary exam normal        Cardiovascular hypertension, Pt. on home beta blockers and Pt. on medications + CAD, + Past MI and + Cardiac Stents (2024)  Normal cardiovascular exam  TTE 06/15/23: EF 44%, global hypokinesis, mild LVH, grade I DD, mild MR       Neuro/Psych negative neurological ROS     GI/Hepatic Neg liver ROS,,,Biliary obstruction   Endo/Other  negative endocrine ROS    Renal/GU negative Renal ROS     Musculoskeletal negative musculoskeletal ROS (+)    Abdominal   Peds  Hematology negative hematology ROS (+)   Anesthesia Other Findings   Reproductive/Obstetrics                              Anesthesia Physical Anesthesia Plan  ASA: 3  Anesthesia Plan: General   Post-op Pain Management: Minimal or no pain anticipated   Induction: Intravenous  PONV Risk Score and Plan: 2 and Treatment may vary due to age or medical condition, Ondansetron  and Dexamethasone   Airway Management Planned: Oral ETT  Additional Equipment: None  Intra-op Plan:   Post-operative Plan: Extubation in OR  Informed Consent: I have reviewed the patients History and Physical, chart, labs and discussed the procedure including the risks, benefits and alternatives for the proposed anesthesia with the patient or authorized representative who has indicated his/her understanding and acceptance.     Dental advisory given and Interpreter used  for interview  Plan Discussed with: CRNA  Anesthesia Plan Comments:          Anesthesia Quick Evaluation  "

## 2024-05-01 ENCOUNTER — Encounter (HOSPITAL_COMMUNITY): Admitting: Anesthesiology

## 2024-05-01 ENCOUNTER — Encounter (HOSPITAL_COMMUNITY): Payer: Self-pay | Admitting: Gastroenterology

## 2024-05-01 ENCOUNTER — Other Ambulatory Visit: Payer: Self-pay

## 2024-05-01 ENCOUNTER — Inpatient Hospital Stay (HOSPITAL_COMMUNITY): Admitting: Anesthesiology

## 2024-05-01 ENCOUNTER — Encounter (HOSPITAL_COMMUNITY): Admission: RE | Disposition: A | Payer: Self-pay | Source: Home / Self Care | Attending: Critical Care Medicine

## 2024-05-01 ENCOUNTER — Inpatient Hospital Stay (HOSPITAL_COMMUNITY)

## 2024-05-01 ENCOUNTER — Inpatient Hospital Stay (HOSPITAL_COMMUNITY)
Admission: RE | Admit: 2024-05-01 | Discharge: 2024-05-07 | DRG: 270 | Disposition: A | Attending: Internal Medicine | Admitting: Internal Medicine

## 2024-05-01 DIAGNOSIS — C801 Malignant (primary) neoplasm, unspecified: Principal | ICD-10-CM | POA: Diagnosis present

## 2024-05-01 DIAGNOSIS — T82867A Thrombosis of cardiac prosthetic devices, implants and grafts, initial encounter: Secondary | ICD-10-CM | POA: Diagnosis not present

## 2024-05-01 DIAGNOSIS — K5669 Other partial intestinal obstruction: Secondary | ICD-10-CM

## 2024-05-01 DIAGNOSIS — I2510 Atherosclerotic heart disease of native coronary artery without angina pectoris: Secondary | ICD-10-CM

## 2024-05-01 DIAGNOSIS — K31A12 Gastric intestinal metaplasia without dysplasia, involving the body (corpus): Secondary | ICD-10-CM | POA: Diagnosis not present

## 2024-05-01 DIAGNOSIS — Z66 Do not resuscitate: Secondary | ICD-10-CM | POA: Diagnosis present

## 2024-05-01 DIAGNOSIS — K259 Gastric ulcer, unspecified as acute or chronic, without hemorrhage or perforation: Secondary | ICD-10-CM

## 2024-05-01 DIAGNOSIS — I255 Ischemic cardiomyopathy: Secondary | ICD-10-CM | POA: Diagnosis present

## 2024-05-01 DIAGNOSIS — K312 Hourglass stricture and stenosis of stomach: Secondary | ICD-10-CM | POA: Diagnosis not present

## 2024-05-01 DIAGNOSIS — K3189 Other diseases of stomach and duodenum: Secondary | ICD-10-CM | POA: Diagnosis present

## 2024-05-01 DIAGNOSIS — K869 Disease of pancreas, unspecified: Secondary | ICD-10-CM | POA: Diagnosis not present

## 2024-05-01 DIAGNOSIS — Z79899 Other long term (current) drug therapy: Secondary | ICD-10-CM

## 2024-05-01 DIAGNOSIS — B9681 Helicobacter pylori [H. pylori] as the cause of diseases classified elsewhere: Secondary | ICD-10-CM | POA: Diagnosis present

## 2024-05-01 DIAGNOSIS — I110 Hypertensive heart disease with heart failure: Secondary | ICD-10-CM | POA: Diagnosis present

## 2024-05-01 DIAGNOSIS — K449 Diaphragmatic hernia without obstruction or gangrene: Secondary | ICD-10-CM

## 2024-05-01 DIAGNOSIS — Z7902 Long term (current) use of antithrombotics/antiplatelets: Secondary | ICD-10-CM

## 2024-05-01 DIAGNOSIS — I214 Non-ST elevation (NSTEMI) myocardial infarction: Secondary | ICD-10-CM | POA: Diagnosis not present

## 2024-05-01 DIAGNOSIS — E43 Unspecified severe protein-calorie malnutrition: Secondary | ICD-10-CM | POA: Diagnosis present

## 2024-05-01 DIAGNOSIS — D638 Anemia in other chronic diseases classified elsewhere: Secondary | ICD-10-CM | POA: Diagnosis present

## 2024-05-01 DIAGNOSIS — D6859 Other primary thrombophilia: Secondary | ICD-10-CM | POA: Diagnosis present

## 2024-05-01 DIAGNOSIS — Z888 Allergy status to other drugs, medicaments and biological substances status: Secondary | ICD-10-CM

## 2024-05-01 DIAGNOSIS — K831 Obstruction of bile duct: Secondary | ICD-10-CM

## 2024-05-01 DIAGNOSIS — E871 Hypo-osmolality and hyponatremia: Secondary | ICD-10-CM | POA: Diagnosis present

## 2024-05-01 DIAGNOSIS — I252 Old myocardial infarction: Secondary | ICD-10-CM

## 2024-05-01 DIAGNOSIS — E872 Acidosis, unspecified: Secondary | ICD-10-CM | POA: Diagnosis present

## 2024-05-01 DIAGNOSIS — I5021 Acute systolic (congestive) heart failure: Secondary | ICD-10-CM | POA: Diagnosis not present

## 2024-05-01 DIAGNOSIS — K254 Chronic or unspecified gastric ulcer with hemorrhage: Secondary | ICD-10-CM | POA: Diagnosis present

## 2024-05-01 DIAGNOSIS — Z955 Presence of coronary angioplasty implant and graft: Secondary | ICD-10-CM | POA: Diagnosis not present

## 2024-05-01 DIAGNOSIS — K2950 Unspecified chronic gastritis without bleeding: Secondary | ICD-10-CM | POA: Diagnosis not present

## 2024-05-01 DIAGNOSIS — Y831 Surgical operation with implant of artificial internal device as the cause of abnormal reaction of the patient, or of later complication, without mention of misadventure at the time of the procedure: Secondary | ICD-10-CM | POA: Diagnosis not present

## 2024-05-01 DIAGNOSIS — Z7189 Other specified counseling: Secondary | ICD-10-CM | POA: Diagnosis not present

## 2024-05-01 DIAGNOSIS — E8770 Fluid overload, unspecified: Secondary | ICD-10-CM | POA: Diagnosis not present

## 2024-05-01 DIAGNOSIS — I213 ST elevation (STEMI) myocardial infarction of unspecified site: Principal | ICD-10-CM | POA: Diagnosis present

## 2024-05-01 DIAGNOSIS — F32A Depression, unspecified: Secondary | ICD-10-CM | POA: Diagnosis present

## 2024-05-01 DIAGNOSIS — R54 Age-related physical debility: Secondary | ICD-10-CM | POA: Diagnosis present

## 2024-05-01 DIAGNOSIS — R64 Cachexia: Secondary | ICD-10-CM | POA: Diagnosis present

## 2024-05-01 DIAGNOSIS — I5023 Acute on chronic systolic (congestive) heart failure: Secondary | ICD-10-CM | POA: Diagnosis present

## 2024-05-01 DIAGNOSIS — Z83438 Family history of other disorder of lipoprotein metabolism and other lipidemia: Secondary | ICD-10-CM

## 2024-05-01 DIAGNOSIS — K760 Fatty (change of) liver, not elsewhere classified: Secondary | ICD-10-CM | POA: Diagnosis present

## 2024-05-01 DIAGNOSIS — K824 Cholesterolosis of gallbladder: Secondary | ICD-10-CM | POA: Diagnosis present

## 2024-05-01 DIAGNOSIS — Z515 Encounter for palliative care: Secondary | ICD-10-CM

## 2024-05-01 DIAGNOSIS — D649 Anemia, unspecified: Secondary | ICD-10-CM | POA: Diagnosis not present

## 2024-05-01 DIAGNOSIS — I472 Ventricular tachycardia, unspecified: Secondary | ICD-10-CM | POA: Diagnosis not present

## 2024-05-01 DIAGNOSIS — D131 Benign neoplasm of stomach: Secondary | ICD-10-CM

## 2024-05-01 DIAGNOSIS — D75838 Other thrombocytosis: Secondary | ICD-10-CM | POA: Diagnosis not present

## 2024-05-01 DIAGNOSIS — E8729 Other acidosis: Secondary | ICD-10-CM | POA: Diagnosis not present

## 2024-05-01 DIAGNOSIS — C168 Malignant neoplasm of overlapping sites of stomach: Secondary | ICD-10-CM | POA: Diagnosis not present

## 2024-05-01 DIAGNOSIS — K2289 Other specified disease of esophagus: Secondary | ICD-10-CM | POA: Diagnosis not present

## 2024-05-01 DIAGNOSIS — I2121 ST elevation (STEMI) myocardial infarction involving left circumflex coronary artery: Secondary | ICD-10-CM | POA: Diagnosis not present

## 2024-05-01 DIAGNOSIS — Z880 Allergy status to penicillin: Secondary | ICD-10-CM

## 2024-05-01 DIAGNOSIS — D509 Iron deficiency anemia, unspecified: Secondary | ICD-10-CM | POA: Diagnosis present

## 2024-05-01 DIAGNOSIS — Z8249 Family history of ischemic heart disease and other diseases of the circulatory system: Secondary | ICD-10-CM

## 2024-05-01 DIAGNOSIS — K8689 Other specified diseases of pancreas: Secondary | ICD-10-CM | POA: Diagnosis present

## 2024-05-01 DIAGNOSIS — D75839 Thrombocytosis, unspecified: Secondary | ICD-10-CM | POA: Diagnosis present

## 2024-05-01 DIAGNOSIS — C169 Malignant neoplasm of stomach, unspecified: Secondary | ICD-10-CM | POA: Diagnosis present

## 2024-05-01 DIAGNOSIS — I509 Heart failure, unspecified: Secondary | ICD-10-CM | POA: Diagnosis not present

## 2024-05-01 DIAGNOSIS — R630 Anorexia: Secondary | ICD-10-CM

## 2024-05-01 DIAGNOSIS — Z6823 Body mass index (BMI) 23.0-23.9, adult: Secondary | ICD-10-CM

## 2024-05-01 DIAGNOSIS — R570 Cardiogenic shock: Secondary | ICD-10-CM | POA: Diagnosis present

## 2024-05-01 DIAGNOSIS — E876 Hypokalemia: Secondary | ICD-10-CM | POA: Diagnosis present

## 2024-05-01 DIAGNOSIS — Z87442 Personal history of urinary calculi: Secondary | ICD-10-CM

## 2024-05-01 DIAGNOSIS — K2970 Gastritis, unspecified, without bleeding: Secondary | ICD-10-CM

## 2024-05-01 DIAGNOSIS — Q132 Other congenital malformations of iris: Secondary | ICD-10-CM

## 2024-05-01 DIAGNOSIS — E785 Hyperlipidemia, unspecified: Secondary | ICD-10-CM | POA: Diagnosis present

## 2024-05-01 DIAGNOSIS — Z7982 Long term (current) use of aspirin: Secondary | ICD-10-CM

## 2024-05-01 DIAGNOSIS — C787 Secondary malignant neoplasm of liver and intrahepatic bile duct: Secondary | ICD-10-CM | POA: Diagnosis present

## 2024-05-01 DIAGNOSIS — I2102 ST elevation (STEMI) myocardial infarction involving left anterior descending coronary artery: Secondary | ICD-10-CM | POA: Diagnosis not present

## 2024-05-01 DIAGNOSIS — D630 Anemia in neoplastic disease: Secondary | ICD-10-CM | POA: Diagnosis not present

## 2024-05-01 HISTORY — PX: ESOPHAGOGASTRODUODENOSCOPY: SHX5428

## 2024-05-01 HISTORY — DX: Acute myocardial infarction, unspecified: I21.9

## 2024-05-01 HISTORY — DX: Inflammatory liver disease, unspecified: K75.9

## 2024-05-01 HISTORY — PX: BIOPSY OF SKIN SUBCUTANEOUS TISSUE AND/OR MUCOUS MEMBRANE: SHX6741

## 2024-05-01 HISTORY — DX: Personal history of urinary calculi: Z87.442

## 2024-05-01 HISTORY — PX: EUS: SHX5427

## 2024-05-01 LAB — BASIC METABOLIC PANEL WITH GFR
Anion gap: 7 (ref 5–15)
BUN: 11 mg/dL (ref 8–23)
CO2: 26 mmol/L (ref 22–32)
Calcium: 8 mg/dL — ABNORMAL LOW (ref 8.9–10.3)
Chloride: 103 mmol/L (ref 98–111)
Creatinine, Ser: 0.64 mg/dL (ref 0.61–1.24)
GFR, Estimated: >60 mL/min
Glucose, Bld: 129 mg/dL — ABNORMAL HIGH (ref 70–99)
Potassium: 4.3 mmol/L (ref 3.5–5.1)
Sodium: 136 mmol/L (ref 135–145)

## 2024-05-01 LAB — HEPATIC FUNCTION PANEL
ALT: 339 U/L — ABNORMAL HIGH (ref 0–44)
AST: 248 U/L — ABNORMAL HIGH (ref 15–41)
Albumin: 2.8 g/dL — ABNORMAL LOW (ref 3.5–5.0)
Alkaline Phosphatase: 1114 U/L — ABNORMAL HIGH (ref 38–126)
Bilirubin, Direct: 1.6 mg/dL — ABNORMAL HIGH (ref 0.0–0.2)
Indirect Bilirubin: 0.4 mg/dL (ref 0.3–0.9)
Total Bilirubin: 2 mg/dL — ABNORMAL HIGH (ref 0.0–1.2)
Total Protein: 5 g/dL — ABNORMAL LOW (ref 6.5–8.1)

## 2024-05-01 LAB — PROTIME-INR
INR: 1 (ref 0.8–1.2)
Prothrombin Time: 13.8 s (ref 11.4–15.2)

## 2024-05-01 LAB — CBC
HCT: 28.6 % — ABNORMAL LOW (ref 39.0–52.0)
Hemoglobin: 8.6 g/dL — ABNORMAL LOW (ref 13.0–17.0)
MCH: 20.1 pg — ABNORMAL LOW (ref 26.0–34.0)
MCHC: 30.1 g/dL (ref 30.0–36.0)
MCV: 67 fL — ABNORMAL LOW (ref 80.0–100.0)
Platelets: 449 K/uL — ABNORMAL HIGH (ref 150–400)
RBC: 4.27 MIL/uL (ref 4.22–5.81)
RDW: 25.3 % — ABNORMAL HIGH (ref 11.5–15.5)
WBC: 6.5 K/uL (ref 4.0–10.5)
nRBC: 0 % (ref 0.0–0.2)

## 2024-05-01 LAB — GLUCOSE, CAPILLARY: Glucose-Capillary: 143 mg/dL — ABNORMAL HIGH (ref 70–99)

## 2024-05-01 MED ORDER — ONDANSETRON HCL 4 MG/2ML IJ SOLN: 4.0000 mg | Freq: Four times a day (QID) | INTRAMUSCULAR | Status: DC | PRN

## 2024-05-01 MED ORDER — FENTANYL CITRATE (PF) 100 MCG/2ML IJ SOLN
INTRAMUSCULAR | Status: DC | PRN
  Administered 2024-05-01: 50 ug via INTRAVENOUS

## 2024-05-01 MED ORDER — LIDOCAINE HCL (CARDIAC) PF 100 MG/5ML IV SOSY
PREFILLED_SYRINGE | INTRAVENOUS | Status: DC | PRN
  Administered 2024-05-01: 60 mg via INTRAVENOUS

## 2024-05-01 MED ORDER — DEXAMETHASONE SOD PHOSPHATE PF 10 MG/ML IJ SOLN
INTRAMUSCULAR | Status: DC | PRN
  Administered 2024-05-01: 4 mg via INTRAVENOUS

## 2024-05-01 MED ORDER — HYDROMORPHONE HCL 1 MG/ML IJ SOLN
0.5000 mg | INTRAMUSCULAR | Status: DC | PRN
  Administered 2024-05-01 (×2): 0.5 mg via INTRAVENOUS
  Administered 2024-05-05: 1 mg via INTRAVENOUS
  Filled 2024-05-01 (×2): qty 1

## 2024-05-01 MED ORDER — ROCURONIUM BROMIDE 100 MG/10ML IV SOLN
INTRAVENOUS | Status: DC | PRN
  Administered 2024-05-01: 50 mg via INTRAVENOUS

## 2024-05-01 MED ORDER — OXYCODONE HCL 5 MG PO TABS
5.0000 mg | ORAL_TABLET | ORAL | Status: DC | PRN
  Administered 2024-05-01: 5 mg via ORAL
  Filled 2024-05-01: qty 1

## 2024-05-01 MED ORDER — IOHEXOL 9 MG/ML PO SOLN
500.0000 mL | ORAL | Status: AC
  Administered 2024-05-01 (×2): 500 mL via ORAL

## 2024-05-01 MED ORDER — PROPOFOL 10 MG/ML IV BOLUS
INTRAVENOUS | Status: DC | PRN
  Administered 2024-05-01: 120 mg via INTRAVENOUS
  Administered 2024-05-01: 30 mg via INTRAVENOUS

## 2024-05-01 MED ORDER — FENTANYL CITRATE (PF) 100 MCG/2ML IJ SOLN
INTRAMUSCULAR | Status: AC
  Filled 2024-05-01: qty 2

## 2024-05-01 MED ORDER — CIPROFLOXACIN IN D5W 400 MG/200ML IV SOLN
INTRAVENOUS | Status: DC | PRN
  Administered 2024-05-01: 400 mg via INTRAVENOUS

## 2024-05-01 MED ORDER — PROPOFOL 10 MG/ML IV BOLUS
INTRAVENOUS | Status: AC
  Filled 2024-05-01: qty 20

## 2024-05-01 MED ORDER — TRAZODONE HCL 50 MG PO TABS
25.0000 mg | ORAL_TABLET | Freq: Every evening | ORAL | Status: DC | PRN
  Administered 2024-05-04 (×2): 25 mg via ORAL
  Filled 2024-05-01 (×2): qty 1

## 2024-05-01 MED ORDER — DROPERIDOL 2.5 MG/ML IJ SOLN: 0.6250 mg | Freq: Once | INTRAMUSCULAR | Status: DC | PRN

## 2024-05-01 MED ORDER — GLUCAGON HCL RDNA (DIAGNOSTIC) 1 MG IJ SOLR
INTRAMUSCULAR | Status: AC
  Filled 2024-05-01: qty 1

## 2024-05-01 MED ORDER — SODIUM CHLORIDE 0.9 % IV SOLN: INTRAVENOUS | Status: DC

## 2024-05-01 MED ORDER — PHENYLEPHRINE 80 MCG/ML (10ML) SYRINGE FOR IV PUSH (FOR BLOOD PRESSURE SUPPORT)
PREFILLED_SYRINGE | INTRAVENOUS | Status: DC | PRN
  Administered 2024-05-01 (×4): 80 ug via INTRAVENOUS

## 2024-05-01 MED ORDER — DICLOFENAC SUPPOSITORY 100 MG
RECTAL | Status: DC | PRN
  Administered 2024-05-01: 100 mg via RECTAL

## 2024-05-01 MED ORDER — IBUPROFEN 400 MG PO TABS: 400.0000 mg | ORAL_TABLET | Freq: Four times a day (QID) | ORAL | Status: DC | PRN

## 2024-05-01 MED ORDER — ALBUTEROL SULFATE (2.5 MG/3ML) 0.083% IN NEBU: 2.5000 mg | INHALATION_SOLUTION | RESPIRATORY_TRACT | Status: DC | PRN

## 2024-05-01 MED ORDER — SUGAMMADEX SODIUM 200 MG/2ML IV SOLN
INTRAVENOUS | Status: DC | PRN
  Administered 2024-05-01: 150 mg via INTRAVENOUS

## 2024-05-01 MED ORDER — ONDANSETRON HCL 4 MG PO TABS: 4.0000 mg | ORAL_TABLET | Freq: Four times a day (QID) | ORAL | Status: DC | PRN

## 2024-05-01 MED ORDER — ONDANSETRON HCL 4 MG/2ML IJ SOLN
INTRAMUSCULAR | Status: DC | PRN
  Administered 2024-05-01: 4 mg via INTRAVENOUS

## 2024-05-01 MED ORDER — CIPROFLOXACIN IN D5W 400 MG/200ML IV SOLN
INTRAVENOUS | Status: AC
  Filled 2024-05-01: qty 200

## 2024-05-01 MED ORDER — SODIUM CHLORIDE 0.9 % IV SOLN: INTRAVENOUS | Status: AC

## 2024-05-01 MED ORDER — DICLOFENAC SUPPOSITORY 100 MG
RECTAL | Status: AC
  Filled 2024-05-01: qty 1

## 2024-05-01 MED ORDER — FENTANYL CITRATE (PF) 100 MCG/2ML IJ SOLN: 25.0000 ug | INTRAMUSCULAR | Status: DC | PRN

## 2024-05-01 MED ORDER — OXYCODONE HCL 5 MG PO TABS: 5.0000 mg | ORAL_TABLET | Freq: Once | ORAL | Status: DC | PRN

## 2024-05-01 MED ORDER — LACTATED RINGERS IV SOLN
INTRAVENOUS | Status: AC | PRN
  Administered 2024-05-01: 1000 mL via INTRAVENOUS

## 2024-05-01 MED ORDER — OXYCODONE HCL 5 MG/5ML PO SOLN: 5.0000 mg | Freq: Once | ORAL | Status: DC | PRN

## 2024-05-01 MED ORDER — IOHEXOL 350 MG/ML SOLN
80.0000 mL | Freq: Once | INTRAVENOUS | Status: AC | PRN
  Administered 2024-05-01: 80 mL via INTRAVENOUS

## 2024-05-01 NOTE — Anesthesia Procedure Notes (Signed)
 Procedure Name: Intubation Date/Time: 05/01/2024 8:28 AM  Performed by: Gladis Honey, CRNAPre-anesthesia Checklist: Patient identified, Emergency Drugs available, Suction available and Patient being monitored Patient Re-evaluated:Patient Re-evaluated prior to induction Oxygen Delivery Method: Circle System Utilized Preoxygenation: Pre-oxygenation with 100% oxygen Induction Type: IV induction Ventilation: Mask ventilation without difficulty Laryngoscope Size: Miller and 2 Grade View: Grade II Tube type: Oral Tube size: 7.5 mm Number of attempts: 1 Airway Equipment and Method: Stylet and Oral airway Placement Confirmation: ETT inserted through vocal cords under direct vision, positive ETCO2 and breath sounds checked- equal and bilateral Secured at: 23 cm Tube secured with: Tape Dental Injury: Teeth and Oropharynx as per pre-operative assessment  Comments: Grade 2A view

## 2024-05-01 NOTE — Plan of Care (Signed)
" °  Problem: Education: Goal: Knowledge of the prescribed therapeutic regimen will improve Outcome: Progressing   Problem: Bowel/Gastric: Goal: Gastrointestinal status for postoperative course will improve Outcome: Progressing   Problem: Cardiac: Goal: Ability to maintain an adequate cardiac output Outcome: Progressing Goal: Will show no evidence of cardiac arrhythmias Outcome: Progressing   "

## 2024-05-01 NOTE — Op Note (Addendum)
 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 Estimated Blood Loss:     Estimated blood loss was minimal. Procedure:                Pre-Anesthesia Assessment:                           - Prior to the procedure, a History and Physical                            was performed, and patient medications and                            allergies were reviewed. The patient's tolerance of                            previous anesthesia was also reviewed. The risks                            and benefits of the procedure and the sedation                            options and risks were discussed with the patient.  All questions were answered, and informed consent                            was obtained. Prior Anticoagulants: The patient has                            taken no anticoagulant or antiplatelet agents. ASA                            Grade Assessment: III - A patient with severe                            systemic disease. After reviewing the  risks and                            benefits, the patient was deemed in satisfactory                            condition to undergo the procedure.                           After obtaining informed consent, the endoscope was                            passed under direct vision. Throughout the                            procedure, the patient's blood pressure, pulse, and                            oxygen saturations were monitored continuously. The                            [id number]-[id number] ([id number]) Olympus endoscope was introduced                            through the mouth, and advanced to the second part                            of duodenum. The upper EUS was accomplished without                            difficulty. The patient tolerated the procedure.                            The GF-UE160-AL5 ([id number]) Olympus endosonoscope                            was introduced through the mouth, and advanced to                            the stomach for ultrasound examination from the  esophagus and stomach. The [id number] ([id number])                            Olympus endosonoscope was introduced through the                            mouth, and advanced to the stomach for ultrasound                            examination from the esophagus and stomach. Scope In: Scope Out: Findings:      ENDOSCOPIC FINDING: :      No gross lesions were noted in the entire esophagus.      The Z-line was irregular and was found 41 cm from the incisors.      A 1 cm hiatal hernia was present.      One non-obstructing non-bleeding cratered gastric ulcer of moderate to       significant severity was found in the cardia extending into the fundus       and proximal gastric body. The lesion was 50 mm by 40 mm in largest       dimension. There is no evidence of perforation. Biopsies were taken with       a cold forceps for histology.      Diffuse moderate mucosal changes characterized by  congestion, erythema,       friability (with contact bleeding) and granularity were found in the       gastric body. Biopsies were taken with a cold forceps for histology and       Helicobacter pylori testing.      One partially obstructing non-bleeding cratered gastric ulcer of       significant severity was found in the middle gastric body extending into       the distal gastric body and antrum. The lesion was circumferential       throughout this area leading to significant narrowing in the antrum.       There is no evidence of perforation. Biopsies were taken with a cold       forceps for histology.      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       This was traversed after gentle pressure of the adult endoscope with       what appeared to be a small mucosal wrent. The radial and linear       echoendoscope's could not traverse this thus a duodenoscope would not be       able to traverse this either.      No gross lesions were noted in the prepyloric region of the stomach.      No gross lesions were noted in the duodenal bulb, in the first portion       of the duodenum and in the second portion of the duodenum.      ENDOSONOGRAPHIC FINDING: :      Pancreatic parenchymal abnormalities were noted in the genu of the       pancreas, pancreatic body and pancreatic tail. These consisted of       atrophy. Unfortunately, as a result of the stenosis noted above,  visualization of the distal neck and head of pancreas is not possible.      The pancreatic duct had a dilated endosonographic appearance, had an       irregularly contoured endosonographic appearance, had a prominently       branched endosonographic appearance and had a tortuous/ectatic       appearance in the genu of the pancreas, body of the pancreas and tail of       the pancreas. The pancreatic duct measured up to 8 mm  in diameter within       the neck and tapering to 4 mm in the tail. The stenosis does not allow       the visualization of the distal neck and head of pancreas duct.      Diffuse wall thickening was visualized endosonographically in the cardia       of the stomach, in the fundus of the stomach and in the body of the       stomach. Within the cardia the thickening involve layer 1/2 and measured       7.2 mm. Within the proximal body the thickening involved layers 1/2/3/4       and measured 12.4 mm. Within the mid body the thickening involved layers       1/2/3/4 and measured 12.2 mm. As documented above, we cannot traverse       the area further with either echo endoscope so cannot describe the rest       of the stomach lining.      Endosonographic imaging in the visualized portion of the liver showed no       mass.      No malignant-appearing lymph nodes were visualized in the paracardial       region (level 16) and left gastric region (level 17).      The celiac region was visualized.      The esophagus and stomach were examined endosonographically. Impression:               EGD Impression:                           - No gross lesions in the entire esophagus. Z-line                            irregular, 41 cm from the incisors.                           - 1 cm hiatal hernia.                           - Non-obstructing non-bleeding gastric ulcer within                            the cardia/fundus/proximal body. Biopsied.                           - Congested, erythematous, friable (with contact                            bleeding) and granular mucosa in the gastric body.  Biopsied.                           - Partially obstructing non-bleeding gastric ulcer                            within the middle body and distal body and antrum.                            This leads to narrowing in the antrum. Biopsied.                           - Significant gastric  stenosis only able to be                            traversed with the adult endoscope (linear/radial                            echoendoscopes cannot traverse)                           - No gross lesions in the prepyloric region.                           - No gross lesions in the duodenal bulb, in the                            first portion of the duodenum and in the second                            portion of the duodenum.                           EUS impression:                           - Pancreatic parenchymal abnormalities consisting                            of atrophy were noted in the genu of the pancreas,                            pancreatic body and pancreatic tail. Cannot                            visualize the distal neck and head of pancreas.                           - The pancreatic duct had a dilated endosonographic                            appearance, had an irregularly contoured                            endosonographic appearance, had a  prominently                            branched endosonographic appearance and had a                            tortuous/ectatic appearance in the genu of the                            pancreas, body of the pancreas and tail of the                            pancreas. The pancreatic duct measured up to 8 mm                            in diameter within the neck and tapered proximally.                           - Wall thickening was seen in the cardia of the                            stomach, in the fundus of the stomach and in the                            body of the stomach. The thickening involved the                            areas of ulceration within the proximal stomach and                            the middle stomach. Could not visualize the distal                            stomach as a result of the stenosis noted.                           - No malignant-appearing lymph nodes were                             visualized in the paracardial region (level 16) and                            left gastric region (level 17).                           - Cannot visualize biliary tree from the stomach                            lining either. Recommendation:           - The patient will be observed post-procedure,                            until all discharge criteria  are met.                           - Admit the patient to hospital ward for ongoing                            care (will discuss with patient and family).                           - Observe patient's clinical course.                           - Hold Effient  for now with plans for further                            intervention via IR. May want to discuss further                            with update to Cardiology if extended time.                           - Await path results.                           - As a result of the patient's stenosis, the                            echoendoscope's as well as the duodenoscope will                            not traverse into the duodenum. Thus, it is not                            endoscopically feasible for endoscopic drainage of                            the biliary tree at this time. The patient will                            need to be admitted for PTBD and brushings of the                            biliary stricture.                           - The findings and recommendations were discussed                            with the patient.                           - The findings and recommendations were discussed                            with  the patient's family. Procedure Code(s):        --- Professional ---                           [phone], Esophagogastroduodenoscopy, flexible,                            transoral; with endoscopic ultrasound examination                            limited to the esophagus, stomach or duodenum, and                            adjacent structures                            43239, Esophagogastroduodenoscopy, flexible,                            transoral; with biopsy, single or multiple Diagnosis Code(s):        --- Professional ---                           K22.89, Other specified disease of esophagus                           K44.9, Diaphragmatic hernia without obstruction or                            gangrene                           K25.9, Gastric ulcer, unspecified as acute or                            chronic, without hemorrhage or perforation                           K92.2, Gastrointestinal hemorrhage, unspecified                           K31.89, Other diseases of stomach and duodenum                           K86.9, Disease of pancreas, unspecified                           R93.3, Abnormal findings on diagnostic imaging of                            other parts of digestive tract                           I89.9, Noninfective disorder of lymphatic vessels                            and lymph nodes, unspecified  K83.1, Obstruction of bile duct                           K86.89, Other specified diseases of pancreas                           R74.8, Abnormal levels of other serum enzymes                           K83.8, Other specified diseases of biliary tract CPT copyright 2022 [employer]. All rights reserved. The codes documented in this report are preliminary and upon coder review may  be revised to meet current compliance requirements. Aloha Finner, MD 05/01/2024 9:39:20 AM Number of Addenda: 0

## 2024-05-01 NOTE — H&P (Signed)
 " History and Physical  Bryan Craig FMW:[id number] DOB: 01-28-1951 DOA: 05/01/2024  PCP: Dorina Loving, PA-C   Chief Complaint: Biliary dilation  HPI: Bryan Craig is a 74 y.o. male with medical history significant for CAD on Effient , cardiomyopathy LVEF 44% grade 1 diastolic dysfunction admitted to the hospital today for further evaluation and management of biliary dilation.  Patient was initially referred to [facility] gastroenterology November 2025 due to elevated LFTs, right upper quadrant ultrasound showed substantial new biliary and dorsal pancreatic duct dilatation, he underwent MRCP 03/28/2024 with focal 1 cm length distal common bile duct stricture.  He was brought to [facility] today for elective EUS and ERCP, Dr. 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  Biopsies were taken, but hospitalist admission was requested along with IR consultation for PTBD and further management.  Denies any weight loss, abdominal pain.  Family at the bedside states that he just started to develop some jaundice overnight.  Review of Systems: Please see HPI for pertinent positives and negatives. A complete 10 system review of systems are otherwise negative.  Past Medical History:  Diagnosis Date   Anemia    CAD (coronary artery disease)    Hepatitis    as a child   History of kidney stones    Hyperlipidemia    Hypertension    Myocardial infarction [facility])    Retinal detachment    Past Surgical History:  Procedure Laterality Date   CORONARY STENT INTERVENTION N/A 07/22/2022   Procedure: CORONARY STENT INTERVENTION;  Surgeon: Verlin Lonni BIRCH, MD;  Location: [facility] INVASIVE CV LAB;  Service: Cardiovascular;  Laterality: N/A;   CORONARY/GRAFT ACUTE MI REVASCULARIZATION N/A 01/24/2023   Procedure: Coronary/Graft Acute MI Revascularization;  Surgeon: Verlin Lonni BIRCH, MD;   Location: [facility] INVASIVE CV LAB;  Service: Cardiovascular;  Laterality: N/A;   EYE SURGERY     LEFT HEART CATH AND CORONARY ANGIOGRAPHY N/A 07/22/2022   Procedure: LEFT HEART CATH AND CORONARY ANGIOGRAPHY;  Surgeon: Verlin Lonni BIRCH, MD;  Location: [facility] INVASIVE CV LAB;  Service: Cardiovascular;  Laterality: N/A;   LEFT HEART CATH AND CORONARY ANGIOGRAPHY N/A 01/24/2023   Procedure: LEFT HEART CATH AND CORONARY ANGIOGRAPHY;  Surgeon: Verlin Lonni BIRCH, MD;  Location: [facility] INVASIVE CV LAB;  Service: Cardiovascular;  Laterality: N/A;   Social History:  reports that he has never smoked. He has never used smokeless tobacco. He reports that he does not currently use alcohol. He reports that he does not use drugs.  Allergies[1]  Family History  Problem Relation Age of Onset   Hyperlipidemia Mother    Hypertension Mother    Heart disease Mother    Heart attack Father 72     Prior to Admission medications  Medication Sig Start Date End Date Taking? Authorizing Provider  aspirin  EC 81 MG tablet Take 1 tablet (81 mg total) by mouth daily. Swallow whole. 07/23/22  Yes Gonfa, Taye T, MD  atorvastatin  (LIPITOR) 80 MG tablet Take 1 tablet (80 mg total) by mouth at bedtime. 02/23/24  Yes Saguier, Loving, PA-C  ezetimibe  (ZETIA ) 10 MG tablet TAKE 1 TABLET BY MOUTH EVERY DAY 02/22/24  Yes Court Dorn PARAS, MD  metoprolol  succinate (TOPROL -XL) 50 MG 24 hr tablet TAKE 1 TABLET (50 MG TOTAL) BY MOUTH DAILY. NEEDS APPT 11/23/23  Yes Saguier, Loving, PA-C  telmisartan  (MICARDIS ) 40 MG tablet TAKE 1 TABLET BY MOUTH EVERY  DAY 04/04/24  Yes Saguier, [place], PA-C  Iron , Ferrous Sulfate , 325 (65 Fe) MG TABS Take 1 tablet by mouth 3 (three) times daily. 03/31/24   Saguier, [place], PA-C  ondansetron  (ZOFRAN ) 4 MG tablet Take 1 tablet (4 mg total) by mouth every 8 (eight) hours as needed for nausea or vomiting. 03/30/24   Saguier, [place], PA-C  prasugrel  (EFFIENT ) 10 MG TABS tablet Take 1 tablet (10 mg total) by mouth  daily. 01/11/24   Court Dorn PARAS, MD  prednisoLONE acetate (PRED FORTE) 1 % ophthalmic suspension Place 1 drop into the left eye daily.    [provider]  Risankizumab-rzaa [facility]) 150 MG/ML SOSY Inject into the skin every 3 (three) months.    [provider]  sodium polystyrene (KAYEXALATE ) 15 GM/60ML suspension Take 60 ml by mouth daily for 4 days 03/11/24   Saguier, [place], PA-C  triamcinolone  cream (KENALOG ) 0.1 % Apply 1 Application topically 2 (two) times daily. Patient taking differently: Apply 1 Application topically 2 (two) times daily as needed (irritation). 11/11/21   Saguier, [place], PA-C    Physical Exam: BP 116/71   Pulse 72   Temp (!) 97.4 F (36.3 C) (Temporal)   Resp 15   Ht 5' 4 (1.626 m)   Wt 66.7 kg   SpO2 96%   BMI 25.23 kg/m  General:  Alert, oriented, calm, in no acute distress, family at the bedside.  Slightly jaundiced. Eyes: EOMI, clear conjuctivae, icteric sclera Neck: supple, no masses, trachea mildline  Cardiovascular: RRR, no murmurs or rubs, no peripheral edema  Respiratory: clear to auscultation bilaterally, no wheezes, no crackles  Abdomen: soft, nontender, nondistended, normal bowel tones heard  Skin: dry, no rashes  Musculoskeletal: no joint effusions, normal range of motion  Psychiatric: appropriate affect, normal speech  Neurologic: extraocular muscles intact, clear speech, moving all extremities with intact sensorium         Labs on Admission:  Basic Metabolic Panel: Recent Labs  Lab 05/01/24 0922  NA 136  K 4.3  CL 103  CO2 26  GLUCOSE 129*  BUN 11  CREATININE 0.64  CALCIUM  8.0*   Liver Function Tests: Recent Labs  Lab 05/01/24 0922  AST 248*  ALT 339*  ALKPHOS 1,114*  BILITOT 2.0*  PROT 5.0*  ALBUMIN 2.8*   No results for input(s): LIPASE, AMYLASE in the last 168 hours. No results for input(s): AMMONIA in the last 168 hours. CBC: Recent Labs  Lab 05/01/24 0922  WBC 6.5  HGB 8.6*  HCT  28.6*  MCV 67.0*  PLT 449*   Cardiac Enzymes: No results for input(s): CKTOTAL, CKMB, CKMBINDEX, TROPONINI in the last 168 hours. BNP (last 3 results) No results for input(s): BNP in the last 8760 hours.  ProBNP (last 3 results) No results for input(s): PROBNP in the last 8760 hours.  CBG: No results for input(s): GLUCAP in the last 168 hours.  Radiological Exams on Admission: No results found. Assessment/Plan Bryan Craig is a 74 y.o. male with medical history significant for CAD on Effient , cardiomyopathy LVEF 44% grade 1 diastolic dysfunction admitted to the hospital today for further evaluation and management of biliary dilation.   Biliary obstruction-concern for possible pancreatic versus gastric malignancy, causing biliary obstruction and gradually rising LFTs.  Patient has recently developed jaundice.  Note CA 19-9 of 356. -Inpatient admission -Keep n.p.o. for now -Discussed extensively with GI Dr. Wilhelmenia, who is consulting IR for possible PTBD -Continue to hold Effient  and other anticoagulants -Gentle IV fluids,  pain and nausea control in the meantime -Gastric biopsies obtained today 1/5  Abnormal LFTs-due to biliary obstruction, avoid hepatotoxins and trend  CAD-will monitor on telemetry, holding Effient  as above, continue home Toprol -XL  Hypertension-Toprol -XL and home dose ARB  Hyperlipidemia-hold home Lipitor due to rising LFTs  DVT prophylaxis: SCDs only    Code Status: Full Code  Consults called: GI, IR  Admission status: The appropriate patient status for this patient is INPATIENT. Inpatient status is judged to be reasonable and necessary in order to provide the required intensity of service to ensure the patient's safety. The patient's presenting symptoms, physical exam findings, and initial radiographic and laboratory data in the context of their chronic comorbidities is felt to place them at high risk for further clinical deterioration.  Furthermore, it is not anticipated that the patient will be medically stable for discharge from the hospital within 2 midnights of admission.    I certify that at the point of admission it is my clinical judgment that the patient will require inpatient hospital care spanning beyond 2 midnights from the point of admission due to high intensity of service, high risk for further deterioration and high frequency of surveillance required  Time spent: 53 minutes  Alura Olveda CHRISTELLA Gail MD [facility] Pager [phone]  If 7PM-7AM, please contact night-coverage [email or web address] Password [facility]  05/01/2024, 10:37 AM      [1]  Allergies Allergen Reactions   Lisinopril     REACTION: cough   Nitroglycerin      Blood pressure drop   Penicillins Rash   "

## 2024-05-01 NOTE — Interval H&P Note (Signed)
 History and Physical Interval Note:  05/01/2024 7:53 AM  Bryan Craig  has presented today for surgery, with the diagnosis of K83.1.  The various methods of treatment have been discussed with the patient and family. After consideration of risks, benefits and other options for treatment, the patient has consented to  Procedures: ERCP, WITH INTERVENTION IF INDICATED (N/A) ULTRASOUND, UPPER GI TRACT, ENDOSCOPIC (N/A) as a surgical intervention.  The patient's history has been reviewed, patient examined, no change in status, stable for surgery.  I have reviewed the patient's chart and labs.  Questions were answered to the patient's satisfaction.     The risks of an EUS including intestinal perforation, bleeding, infection, aspiration, and medication effects were discussed as was the possibility it may not give a definitive diagnosis if a biopsy is performed.  When a biopsy of the pancreas is done as part of the EUS, there is an additional risk of pancreatitis at the rate of about 1-2%.  It was explained that procedure related pancreatitis is typically mild, although it can be severe and even life threatening, which is why we do not perform random pancreatic biopsies and only biopsy a lesion/area we feel is concerning enough to warrant the risk.  The risks of an ERCP were discussed at length, including but not limited to the risk of perforation, bleeding, abdominal pain, post-ERCP pancreatitis (while usually mild can be severe and even life threatening).    [name]

## 2024-05-01 NOTE — Anesthesia Postprocedure Evaluation (Signed)
"   Anesthesia Post Note  Patient: Bryan Craig  Procedure(s) Performed: ULTRASOUND, UPPER GI TRACT, ENDOSCOPIC EGD (ESOPHAGOGASTRODUODENOSCOPY) BIOPSY, SKIN, SUBCUTANEOUS TISSUE, OR MUCOUS MEMBRANE     Patient location during evaluation: PACU Anesthesia Type: General Level of consciousness: awake and alert Pain management: pain level controlled Vital Signs Assessment: post-procedure vital signs reviewed and stable Respiratory status: spontaneous breathing, nonlabored ventilation and respiratory function stable Cardiovascular status: blood pressure returned to baseline Postop Assessment: no apparent nausea or vomiting Anesthetic complications: no   No notable events documented.  Last Vitals:  Vitals:   05/01/24 0950 05/01/24 1000  BP: 122/69   Pulse: 72 72  Resp: 17 15  Temp:    SpO2: 96% 96%    Last Pain:  Vitals:   05/01/24 0933  TempSrc: Temporal  PainSc:                  Bryan Craig      "

## 2024-05-01 NOTE — Transfer of Care (Signed)
 Immediate Anesthesia Transfer of Care Note  Patient: Bryan Craig  Procedure(s) Performed: ULTRASOUND, UPPER GI TRACT, ENDOSCOPIC EGD (ESOPHAGOGASTRODUODENOSCOPY) BIOPSY, SKIN, SUBCUTANEOUS TISSUE, OR MUCOUS MEMBRANE  Patient Location: PACU  Anesthesia Type:General  Level of Consciousness: awake, alert , and oriented  Airway & Oxygen Therapy: Patient Spontanous Breathing and Patient connected to face mask oxygen  Post-op Assessment: Report given to RN and Post -op Vital signs reviewed and stable  Post vital signs: Reviewed and stable  Last Vitals:  Vitals Value Taken Time  BP 121/72 05/01/24 09:40  Temp 36.3 C 05/01/24 09:33  Pulse 77 05/01/24 09:46  Resp 20 05/01/24 09:46  SpO2 100 % 05/01/24 09:46  Vitals shown include unfiled device data.  Last Pain:  Vitals:   05/01/24 0933  TempSrc: Temporal  PainSc:          Complications: No notable events documented.

## 2024-05-02 ENCOUNTER — Inpatient Hospital Stay (HOSPITAL_COMMUNITY)

## 2024-05-02 ENCOUNTER — Ambulatory Visit: Payer: Self-pay | Admitting: Gastroenterology

## 2024-05-02 ENCOUNTER — Encounter (HOSPITAL_COMMUNITY): Payer: Self-pay | Admitting: Gastroenterology

## 2024-05-02 DIAGNOSIS — D509 Iron deficiency anemia, unspecified: Secondary | ICD-10-CM

## 2024-05-02 DIAGNOSIS — I2121 ST elevation (STEMI) myocardial infarction involving left circumflex coronary artery: Secondary | ICD-10-CM

## 2024-05-02 DIAGNOSIS — K831 Obstruction of bile duct: Secondary | ICD-10-CM | POA: Diagnosis not present

## 2024-05-02 HISTORY — PX: IR BILIARY DRAIN PLACEMENT WITH CHOLANGIOGRAM: IMG6043

## 2024-05-02 LAB — CBC
HCT: 29.3 % — ABNORMAL LOW (ref 39.0–52.0)
HCT: 31.5 % — ABNORMAL LOW (ref 39.0–52.0)
Hemoglobin: 8.7 g/dL — ABNORMAL LOW (ref 13.0–17.0)
Hemoglobin: 9.3 g/dL — ABNORMAL LOW (ref 13.0–17.0)
MCH: 19.8 pg — ABNORMAL LOW (ref 26.0–34.0)
MCH: 19.9 pg — ABNORMAL LOW (ref 26.0–34.0)
MCHC: 29.5 g/dL — ABNORMAL LOW (ref 30.0–36.0)
MCHC: 29.7 g/dL — ABNORMAL LOW (ref 30.0–36.0)
MCV: 67 fL — ABNORMAL LOW (ref 80.0–100.0)
MCV: 67 fL — ABNORMAL LOW (ref 80.0–100.0)
Platelets: 479 K/uL — ABNORMAL HIGH (ref 150–400)
Platelets: 567 K/uL — ABNORMAL HIGH (ref 150–400)
RBC: 4.37 MIL/uL (ref 4.22–5.81)
RBC: 4.7 MIL/uL (ref 4.22–5.81)
RDW: 25.1 % — ABNORMAL HIGH (ref 11.5–15.5)
RDW: 25.1 % — ABNORMAL HIGH (ref 11.5–15.5)
WBC: 4.8 K/uL (ref 4.0–10.5)
WBC: 6 K/uL (ref 4.0–10.5)
nRBC: 0 % (ref 0.0–0.2)
nRBC: 0 % (ref 0.0–0.2)

## 2024-05-02 LAB — ECHOCARDIOGRAM COMPLETE
Area-P 1/2: 3.86 cm2
Calc EF: 42 %
Height: 67 in
S' Lateral: 4.5 cm
Single Plane A2C EF: 44.4 %
Single Plane A4C EF: 41.9 %
Weight: 2412.71 [oz_av]

## 2024-05-02 LAB — COMPREHENSIVE METABOLIC PANEL WITH GFR
ALT: 311 U/L — ABNORMAL HIGH (ref 0–44)
AST: 187 U/L — ABNORMAL HIGH (ref 15–41)
Albumin: 2.9 g/dL — ABNORMAL LOW (ref 3.5–5.0)
Alkaline Phosphatase: 1039 U/L — ABNORMAL HIGH (ref 38–126)
Anion gap: 7 (ref 5–15)
BUN: 12 mg/dL (ref 8–23)
CO2: 25 mmol/L (ref 22–32)
Calcium: 8.2 mg/dL — ABNORMAL LOW (ref 8.9–10.3)
Chloride: 102 mmol/L (ref 98–111)
Creatinine, Ser: 0.62 mg/dL (ref 0.61–1.24)
GFR, Estimated: >60 mL/min
Glucose, Bld: 137 mg/dL — ABNORMAL HIGH (ref 70–99)
Potassium: 4.1 mmol/L (ref 3.5–5.1)
Sodium: 135 mmol/L (ref 135–145)
Total Bilirubin: 1.3 mg/dL — ABNORMAL HIGH (ref 0.0–1.2)
Total Protein: 5.3 g/dL — ABNORMAL LOW (ref 6.5–8.1)

## 2024-05-02 LAB — BASIC METABOLIC PANEL WITH GFR
Anion gap: 9 (ref 5–15)
BUN: 13 mg/dL (ref 8–23)
CO2: 24 mmol/L (ref 22–32)
Calcium: 8.8 mg/dL — ABNORMAL LOW (ref 8.9–10.3)
Chloride: 103 mmol/L (ref 98–111)
Creatinine, Ser: 0.7 mg/dL (ref 0.61–1.24)
GFR, Estimated: >60 mL/min
Glucose, Bld: 140 mg/dL — ABNORMAL HIGH (ref 70–99)
Potassium: 4.1 mmol/L (ref 3.5–5.1)
Sodium: 136 mmol/L (ref 135–145)

## 2024-05-02 LAB — TROPONIN T, HIGH SENSITIVITY
Troponin T High Sensitivity: 28 ng/L — ABNORMAL HIGH (ref 0–19)
Troponin T High Sensitivity: 248 ng/L (ref 0–19)
Troponin T High Sensitivity: 989 ng/L (ref 0–19)
Troponin T High Sensitivity: 1160 ng/L (ref 0–19)
Troponin T High Sensitivity: 1476 ng/L (ref 0–19)

## 2024-05-02 LAB — MRSA NEXT GEN BY PCR, NASAL: MRSA by PCR Next Gen: NOT DETECTED

## 2024-05-02 LAB — MAGNESIUM: Magnesium: 2.2 mg/dL (ref 1.7–2.4)

## 2024-05-02 MED ORDER — ASPIRIN 81 MG PO CHEW: 81.0000 mg | CHEWABLE_TABLET | Freq: Every day | ORAL | Status: DC

## 2024-05-02 MED ORDER — MIDAZOLAM HCL 2 MG/2ML IJ SOLN
INTRAMUSCULAR | Status: AC
  Filled 2024-05-02: qty 2

## 2024-05-02 MED ORDER — METOPROLOL SUCCINATE ER 50 MG PO TB24
50.0000 mg | ORAL_TABLET | Freq: Every day | ORAL | Status: DC
  Administered 2024-05-02: 50 mg via ORAL
  Filled 2024-05-02 (×2): qty 1

## 2024-05-02 MED ORDER — FENTANYL CITRATE (PF) 100 MCG/2ML IJ SOLN
INTRAMUSCULAR | Status: AC
  Filled 2024-05-02: qty 2

## 2024-05-02 MED ORDER — LIDOCAINE-EPINEPHRINE 1 %-1:100000 IJ SOLN
INTRAMUSCULAR | Status: AC
  Filled 2024-05-02: qty 20

## 2024-05-02 MED ORDER — SODIUM CHLORIDE 0.9 % IV SOLN: INTRAVENOUS | Status: AC

## 2024-05-02 MED ORDER — IOHEXOL 300 MG/ML  SOLN: 50.0000 mL | Freq: Once | INTRAMUSCULAR | Status: DC | PRN

## 2024-05-02 MED ORDER — PERFLUTREN LIPID MICROSPHERE
1.0000 mL | INTRAVENOUS | Status: AC | PRN
  Administered 2024-05-02: 2 mL via INTRAVENOUS

## 2024-05-02 MED ORDER — ASPIRIN 81 MG PO TBEC
81.0000 mg | DELAYED_RELEASE_TABLET | Freq: Every day | ORAL | Status: DC
  Administered 2024-05-04 – 2024-05-07 (×4): 81 mg via ORAL
  Filled 2024-05-02 (×4): qty 1

## 2024-05-02 MED ORDER — SODIUM CHLORIDE 0.9 % IV SOLN
2.0000 g | Freq: Once | INTRAVENOUS | Status: AC
  Administered 2024-05-02: 2 g via INTRAVENOUS
  Filled 2024-05-02: qty 20

## 2024-05-02 MED ORDER — MIDAZOLAM HCL (PF) 2 MG/2ML IJ SOLN
INTRAMUSCULAR | Status: AC | PRN
  Administered 2024-05-02: 1 mg via INTRAVENOUS

## 2024-05-02 MED ORDER — ORAL CARE MOUTH RINSE: 15.0000 mL | OROMUCOSAL | Status: DC | PRN

## 2024-05-02 MED ORDER — IRBESARTAN 150 MG PO TABS
150.0000 mg | ORAL_TABLET | Freq: Every day | ORAL | Status: DC
  Administered 2024-05-02: 150 mg via ORAL
  Filled 2024-05-02 (×2): qty 1

## 2024-05-02 MED ORDER — FENTANYL CITRATE (PF) 100 MCG/2ML IJ SOLN
INTRAMUSCULAR | Status: AC | PRN
  Administered 2024-05-02: 50 ug via INTRAVENOUS

## 2024-05-02 MED ORDER — SODIUM CHLORIDE 0.9% FLUSH
5.0000 mL | Freq: Three times a day (TID) | INTRAVENOUS | Status: DC
  Administered 2024-05-02 – 2024-05-07 (×14): 5 mL

## 2024-05-02 MED ORDER — PANTOPRAZOLE SODIUM 40 MG IV SOLR
40.0000 mg | Freq: Every day | INTRAVENOUS | Status: DC
  Administered 2024-05-02 – 2024-05-07 (×6): 40 mg via INTRAVENOUS
  Filled 2024-05-02 (×6): qty 10

## 2024-05-02 MED ORDER — LIDOCAINE-EPINEPHRINE 1 %-1:100000 IJ SOLN: 20.0000 mL | Freq: Once | INTRAMUSCULAR | Status: DC

## 2024-05-02 MED ORDER — CHLORHEXIDINE GLUCONATE CLOTH 2 % EX PADS
6.0000 | MEDICATED_PAD | Freq: Every day | CUTANEOUS | Status: DC
  Administered 2024-05-02 – 2024-05-07 (×5): 6 via TOPICAL

## 2024-05-02 NOTE — Consult Note (Addendum)
 " As below, patient seen and examined.  Briefly he is a 74 year old male with past medical history of coronary artery disease status post PCI of his ramus, ischemic cardiomyopathy, hypertension, hyperlipidemia admitted with elevated liver functions and probable GI malignancy for evaluation of myocardial infarction.  Patient has had no chest pain since his last stent in September 2024.  He was recently found to have elevated liver functions and microcytic anemia and is undergoing GI evaluation for possible pancreatic cancer.  His antiplatelets were held on December 30.  Last evening he developed substernal chest pain radiating to his back similar to his previous infarct pain.  The pain eventually resolved and he is pain-free this morning.  Cardiology asked to evaluate.  Electrocardiogram at the time of his event showed sinus rhythm with lateral ST elevation and anterior ST depression.  Follow-up ECG this morning when he is pain-free shows sinus rhythm with slight residual ST elevation laterally but improved ST depression anteriorly.  Creatinine is 0.62, alkaline phosphatase 1039, AST 187, ALT 311, troponin 28 and 248, hemoglobin 8.7 with MCV 67 and platelet count 479.  1 non-ST elevation myocardial infarction-patient had chest pain identical to his previous infarct pain last evening.  His electrocardiogram showed lateral ST elevation with anterior ST depression.  I would be concerned about acute thrombosis of ramus stent.  His symptoms have now resolved and his electrocardiogram has improved.  Extremely difficult situation.  Ideally he should undergo cardiac catheterization with possible PCI.  However he is undergoing evaluation for possible pancreatic cancer and has a microcytic anemia.  He may require additional procedures and his antiplatelets were held prior to admission for that reason.  If he requires PCI then he will require dual antiplatelet therapy which could not be interrupted.  Also not clear that he can  undergo GI procedures on dual antiplatelet therapy.  I will discuss with gastroenterology and we will plan further management at that point.  I will resume aspirin  81 mg daily as well as Toprol  50 mg daily.  Will hold on statin therapy given ongoing elevated liver functions.  No heparin  for now given above issues concerning anticoagulation.  Would repeat echocardiogram.  2 history of ischemic cardiomyopathy-will resume ARB.  Resume Toprol .  Schedule echocardiogram to assess LV function.  3 elevated liver functions/possible pancreatic cancer-evaluation ongoing.  Will discuss further with gastroenterology.  4 hypertension-patient's blood pressure is elevated.  Resume ARB and Toprol  and follow.   I have discussed the patient with Dr. Wilhelmenia, Dr Hughes of interventional radiology and also Dr. Verlin.  Extremely difficult situation.  Patient clearly had a myocardial infarction last evening with associated ECG changes.  Ordinarily I would like to proceed with cardiac catheterization and PCI if needed prior to any procedures being performed.  However intervention would require dual antiplatelet therapy.  Patient will require percutaneous drain as apparently he is obstructing his bile duct which would lead to patient being fully jaundiced and risk for cholangitis.  This cannot be performed on any antiplatelet medication.  Therefore our only option is to proceed with percutaneous drain now followed by cardiac catheterization tomorrow with PCI if indicated.  I had in-depth discussions with patient and his daughters concerning this issue today.  I explained that he would be high risk for his percutaneous drain but no other good options at this time.  They are in agreement to proceed.  Redell Shallow, MD    Cardiology Consultation  Patient ID: Audra Bellard MRN: [id number]; DOB: 1950-07-17  Admit date: 05/01/2024 Date of Consult: 05/02/2024  PCP:  Dorina Dallas RIGGERS   [facility]  Providers Cardiologist:  Dorn Lesches, MD     Patient Profile: Maxon Kresse is a 74 y.o. 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 Will.  History of Present Illness: Mr. Mckeithan has past medical history stated above.  He had presented to [facility] for scheduled EUS and ERCP on 05/01/2024.  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  Biopsies were taken, patient was then admitted to the hospital with request for IR consultation for PTBD and further management.  Overnight around 11:30 PM 05/01/2024 patient reported diffuse upper/central chest pain, 9 out of 10, moving into his back.  As a result of this, EKG was performed and troponins were drawn.  Troponin resulted as 28 ? 248.  Repeat EKG was performed that showed sinus rhythm, HR 82, some nonspecific ST/T wave changes however when compared to EKG from day prior they actually appear improved.  On EKG from 05/01/2024 there is some ST elevation in leads I, aVL, with depression and V3 through V4. Overnight the on-call cardiology fellow was contacted, who denies any additional workup at that time.  He is a patient of Dr. Lesches, was last seen by him 05/11/2023.  He has a history of coronary artery disease s/p NSTEMI in March 2024 where he underwent cardiac catheterization with PCI/DES to the ramus branch.  He was discharged home on DAPT with aspirin  and Brilinta , then presented again in September 2024 with a STEMI, his ramus branch stent was occluded, underwent reintervention, at that time he had a 60% proximal LAD stenosis that did not appear physiologically significant,  he was discharged that time on aspirin  and Effient .  He has had no recurrent symptoms since then.  Echo on 01/26/2023 showed LVEF 45%, reduced from previous EF of 60% at the time of his first NSTEMI.  It suspected that this was all in the setting of ischemic cardiomyopathy.  He was started on GDMT and echocardiogram was rechecked.  His medication regimen as of January 2025 included: Aspirin  81 mg daily, Effient  10 mg daily, Lipitor 80 mg daily, Zetia  10 mg daily, Toprol  50 mg daily, telmisartan  80 mg daily, as needed sublingual nitroglycerin .  Repeat echocardiograms, most recent 06/15/2023 showed: LVEF 44%, global hypokinesis, G1DD, normal RV systolic function, mild MR, normal IVC.  With no significant change from prior study, Dr. Lesches recommended repeating when clinically indicated. He has not followed up with our office since.   Since being admitted, most of his home medications have been held in the setting of possible IR intervention.  He was not continued on any of his home medications either.  After speaking with the patient and daughters who present in the room, they agree with the history stated above. They tell me that after undergoing his most recent left heart cath with PCI in September 2024 until now he has been doing really well at home.  Patient tells me he is able to do everything he needs to do without any anginal symptoms, denying any chest pain, shortness of breath at rest or with  exertion.  He tells me that the chest pain that he had last night was pretty intense, lasting a significant amount of time until he was given oxycodone  and morphine .  This alleviated his pain.  He reports having an allergy to nitroglycerin  which is why was not administered, allergy listed reports blood pressure drop.  Past Medical History:  Diagnosis Date   Anemia    CAD (coronary artery disease)    Hepatitis    as a child   History of kidney stones    Hyperlipidemia    Hypertension    Myocardial  infarction [facility])    Retinal detachment    Past Surgical History:  Procedure Laterality Date   CORONARY STENT INTERVENTION N/A 07/22/2022   Procedure: CORONARY STENT INTERVENTION;  Surgeon: Verlin Lonni BIRCH, MD;  Location: [facility] INVASIVE CV LAB;  Service: Cardiovascular;  Laterality: N/A;   CORONARY/GRAFT ACUTE MI REVASCULARIZATION N/A 01/24/2023   Procedure: Coronary/Graft Acute MI Revascularization;  Surgeon: Verlin Lonni BIRCH, MD;  Location: [facility] INVASIVE CV LAB;  Service: Cardiovascular;  Laterality: N/A;   EYE SURGERY     LEFT HEART CATH AND CORONARY ANGIOGRAPHY N/A 07/22/2022   Procedure: LEFT HEART CATH AND CORONARY ANGIOGRAPHY;  Surgeon: Verlin Lonni BIRCH, MD;  Location: [facility] INVASIVE CV LAB;  Service: Cardiovascular;  Laterality: N/A;   LEFT HEART CATH AND CORONARY ANGIOGRAPHY N/A 01/24/2023   Procedure: LEFT HEART CATH AND CORONARY ANGIOGRAPHY;  Surgeon: Verlin Lonni BIRCH, MD;  Location: [facility] INVASIVE CV LAB;  Service: Cardiovascular;  Laterality: N/A;    Home Medications:  Prior to Admission medications  Medication Sig Start Date End Date Taking? Authorizing Provider  aspirin  EC 81 MG tablet Take 1 tablet (81 mg total) by mouth daily. Swallow whole. 07/23/22  Yes Gonfa, Taye T, MD  atorvastatin  (LIPITOR) 80 MG tablet Take 1 tablet (80 mg total) by mouth at bedtime. 02/23/24  Yes Saguier, [place], PA-C  ezetimibe  (ZETIA ) 10 MG tablet TAKE 1 TABLET BY MOUTH EVERY DAY 02/22/24  Yes Court Dorn PARAS, MD  Iron , Ferrous Sulfate , 325 (65 Fe) MG TABS Take 1 tablet by mouth 3 (three) times daily. Patient taking differently: Take 1 tablet by mouth in the morning and at bedtime. 03/31/24  Yes Saguier, [place], PA-C  metoprolol  succinate (TOPROL -XL) 50 MG 24 hr tablet TAKE 1 TABLET (50 MG TOTAL) BY MOUTH DAILY. NEEDS APPT 11/23/23  Yes Saguier, [place], PA-C  ondansetron  (ZOFRAN ) 4 MG tablet Take 1 tablet (4 mg total) by mouth every 8 (eight) hours as needed for nausea or vomiting.  03/30/24  Yes Saguier, [place], PA-C  prasugrel  (EFFIENT ) 10 MG TABS tablet Take 1 tablet (10 mg total) by mouth daily. 01/11/24  Yes Court Dorn PARAS, MD  prednisoLONE acetate (PRED FORTE) 1 % ophthalmic suspension Place 1 drop into the left eye daily.   Yes [provider]  SKYRIZI PEN 150 MG/ML pen Inject into the skin every 3 (three) months. 1 dose every 3 months 02/24/24  Yes [provider]  telmisartan  (MICARDIS ) 40 MG tablet TAKE 1 TABLET BY MOUTH EVERY DAY 04/04/24  Yes Saguier, [place], PA-C  triamcinolone  cream (KENALOG ) 0.1 % Apply 1 Application topically 2 (two) times daily. Patient taking differently: Apply 1 Application topically daily as needed (irritation). 11/11/21  Yes Saguier, [place], PA-C  sodium polystyrene (KAYEXALATE ) 15 GM/60ML suspension Take 60 ml by mouth daily for 4 days Patient not taking: Reported on 05/01/2024 03/11/24   Saguier, [place], PA-C   Scheduled Meds:  pantoprazole  (PROTONIX ) IV  40 mg Intravenous Daily   Continuous Infusions:   PRN Meds: albuterol , HYDROmorphone  (DILAUDID ) injection, ondansetron  **OR** ondansetron  (ZOFRAN ) IV, oxyCODONE , traZODone   Allergies:   Allergies[1]  Social History:   Social History   Socioeconomic History   Marital status: Married    Spouse name: Lonell   Number of children: 3   Years of education: Not on file   Highest education level: Not on file  Occupational History   Occupation: retired    Comment: civil service fast streamer  Tobacco Use   Smoking status: Never   Smokeless tobacco: Never  Vaping Use   Vaping status: Never Used  Substance and Sexual Activity   Alcohol use: Not Currently   Drug use: Never   Sexual activity: Yes  Other Topics Concern   Not on file  Social History Narrative   Married, 2 daughters with current wife and 1 daughter from previous marriage   Social Drivers of Health   Tobacco Use: Low Risk (05/01/2024)   Patient History    Smoking Tobacco Use: Never     Smokeless Tobacco Use: Never    Passive Exposure: Not on file  Financial Resource Strain: Low Risk (11/30/2023)   Overall Financial Resource Strain (CARDIA)    Difficulty of Paying Living Expenses: Not hard at all  Food Insecurity: No Food Insecurity (05/01/2024)   Epic    Worried About Radiation Protection Practitioner of Food in the Last Year: Never true    Ran Out of Food in the Last Year: Never true  Transportation Needs: No Transportation Needs (05/01/2024)   Epic    Lack of Transportation (Medical): No    Lack of Transportation (Non-Medical): No  Physical Activity: Sufficiently Active (11/30/2023)   Exercise Vital Sign    Days of Exercise per Week: 7 days    Minutes of Exercise per Session: 50 min  Stress: No Stress Concern Present (11/30/2023)   [employer] of Occupational Health - Occupational Stress Questionnaire    Feeling of Stress: Not at all  Social Connections: Moderately Isolated (05/01/2024)   Social Connection and Isolation Panel    Frequency of Communication with Friends and Family: More than three times a week    Frequency of Social Gatherings with Friends and Family: More than three times a week    Attends Religious Services: Never    Database Administrator or Organizations: No    Attends Banker Meetings: Never    Marital Status: Married  Catering Manager Violence: Not At Risk (05/01/2024)   Epic    Fear of Current or Ex-Partner: No    Emotionally Abused: No    Physically Abused: No    Sexually Abused: No  Depression (PHQ2-9): Low Risk (11/30/2023)   Depression (PHQ2-9)    PHQ-2 Score: 0  Alcohol Screen: Low Risk (11/30/2023)   Alcohol Screen    Last Alcohol Screening Score (AUDIT): 2  Housing: Low Risk (05/01/2024)   Epic    Unable to Pay for Housing in the Last Year: No    Number of Times Moved in the Last Year: 0    Homeless in the Last Year: No  Utilities: Not At Risk (05/01/2024)   Epic    Threatened with loss of utilities: No  Health Literacy: Adequate Health  Literacy (11/30/2023)   B1300 Health Literacy    Frequency of need for help with medical instructions: Never    Family History:   Family History  Problem Relation Age of Onset   Hyperlipidemia Mother  Hypertension Mother    Heart disease Mother    Heart attack Father 34    ROS:  Please see the history of present illness.  All other ROS reviewed and negative.     Physical Exam/Data: Vitals:   05/02/24 0201 05/02/24 0301 05/02/24 0402 05/02/24 0801  BP: (!) 137/96 (!) 141/92 138/83 (!) 138/91  Pulse: 80 81 82 75  Resp:   17   Temp:   97.6 F (36.4 C)   TempSrc:   Oral   SpO2: 100% 100% 100% 100%  Weight:      Height:        Intake/Output Summary (Last 24 hours) at 05/02/2024 1040 Last data filed at 05/02/2024 0557 Gross per 24 hour  Intake 2059.01 ml  Output --  Net 2059.01 ml      05/01/2024    6:59 AM 04/14/2024    1:16 PM 04/03/2024   10:29 AM  Last 3 Weights  Weight (lbs) 147 lb 153 lb 161 lb 9.6 oz  Weight (kg) 66.679 kg 69.4 kg 73.301 kg     Body mass index is 25.23 kg/m.   General:  Well nourished, well developed, in no acute distress HEENT: normal Neck: no JVD Vascular: No carotid bruits; Distal pulses 2+ bilaterally Cardiac:  normal S1, S2; RRR; no murmur  Lungs:  clear to auscultation bilaterally, no wheezing, rhonchi or rales  Abd: soft, nontender Ext: no edema Musculoskeletal:  No deformities Skin: warm and dry  Neuro:  no focal abnormalities noted Psych:  Normal affect   EKG:  The EKG was personally reviewed and demonstrates: ial EKG performed 05/01/2024 showed ST depression in V2-V4 with some STE in lead I/aVL, improved on follow up EKG 05/03/2023  Telemetry:  Telemetry was personally reviewed and demonstrates:  sinus   Relevant CV Studies:  Echocardiogram, 06/15/2023 Left ventricular ejection fraction by 3D volume is 44% . The left ventricle has mildly decreased function. The left ventricle demonstrates global hypokinesis. There is mild left  ventricular hypertrophy. Left ventricular diastolic parameters are consistent with Grade I diastolic dysfunction ( impaired relaxation) . The average left ventricular global longitudinal strain is - 8. 6 % . The global longitudinal strain is abnormal.  Right ventricular systolic function is normal. The right ventricular size is normal. There is normal pulmonary artery systolic pressure. The estimated right ventricular systolic pressure is 28. 8 mmHg.  The mitral valve is normal in structure. Mild mitral valve regurgitation. No evidence of mitral stenosis.  The aortic valve is normal in structure. There is mild calcification of the aortic valve. Aortic valve regurgitation is not visualized. Aortic valve sclerosis is present, with no evidence of aortic valve stenosis.  The inferior vena cava is normal in size with greater than 50% respiratory variability, suggesting right atrial pressure of 3 mmHg.  Comparison(s) : No significant change from prior study. Prior images reviewed side by side. EF 45% , mild LVH, basal to mid inferolateral akinesis and basal to mid inferior hypokinesis.  Cardiac cath, 01/24/2023   Ost Cx to Prox Cx lesion is 30% stenosed.   Prox RCA lesion is 30% stenosed.   Mid RCA lesion is 30% stenosed.   Prox Cx to Mid Cx lesion is 50% stenosed.   Ramus lesion is 99% stenosed.   Mid LAD lesion is 60% stenosed.   A drug-eluting stent was successfully placed using a SYNERGY XD 2.75X12.   Post intervention, there is a 0% residual stenosis.   Acute lateral STEMI secondary to  thrombotic sub-total occlusion on the proximal edge of the old stent Successful PTCA/DES x 1 proximal segment of the ramus intermediate branch overlapping with the old stent Moderate mid LAD stenosis.  Moderate mid Circumflex stenosis Mild stenosis in the RCA LVEDP 13 mmHg   Recommendations: Will admit to the ICU. Will change Brilinta  to Effient . Effient  load given in the cath lab. Aggrastat  infusion 2 hours  post cath. Continue DAPT with ASA and Effient  for lifetime if he tolerates as uncertain mechanism of thrombosis on the proximal edge of the old stent. Continue statin and Zetia . Echo tomorrow. If he has angina, could consider pressure wire analysis of the LAD which appears to be an eccentric moderate stenosis. PCI of the LAD would involve stenting across a large diagonal branch so would favor medical management of this lesion if he does not have angina.   Laboratory Data: High Sensitivity Troponin:  No results for input(s): TROPONINIHS in the last 720 hours.  Recent Labs  Lab 05/02/24 0020 05/02/24 0438  TRNPT 28* 248*      Chemistry Recent Labs  Lab 05/01/24 0922 05/02/24 0020 05/02/24 0438  NA 136 136 135  K 4.3 4.1 4.1  CL 103 103 102  CO2 26 24 25   GLUCOSE 129* 140* 137*  BUN 11 13 12   CREATININE 0.64 0.70 0.62  CALCIUM  8.0* 8.8* 8.2*  MG  --  2.2  --   GFRNONAA >60 >60 >60  ANIONGAP 7 9 7     Recent Labs  Lab 05/01/24 0922 05/02/24 0438  PROT 5.0* 5.3*  ALBUMIN 2.8* 2.9*  AST 248* 187*  ALT 339* 311*  ALKPHOS 1,114* 1,039*  BILITOT 2.0* 1.3*   Lipids No results for input(s): CHOL, TRIG, HDL, LABVLDL, LDLCALC, CHOLHDL in the last 168 hours.  Hematology Recent Labs  Lab 05/01/24 0922 05/02/24 0020 05/02/24 0438  WBC 6.5 4.8 6.0  RBC 4.27 4.70 4.37  HGB 8.6* 9.3* 8.7*  HCT 28.6* 31.5* 29.3*  MCV 67.0* 67.0* 67.0*  MCH 20.1* 19.8* 19.9*  MCHC 30.1 29.5* 29.7*  RDW 25.3* 25.1* 25.1*  PLT 449* 567* 479*   Thyroid  No results for input(s): TSH, FREET4 in the last 168 hours.  BNPNo results for input(s): BNP, PROBNP in the last 168 hours.  DDimer No results for input(s): DDIMER in the last 168 hours.  Radiology/Studies:  CT ABDOMEN PELVIS W CONTRAST Result Date: 05/01/2024 EXAM: CT ABDOMEN AND PELVIS WITH CONTRAST 05/01/2024 04:45:00 PM TECHNIQUE: CT of the abdomen and pelvis was performed with the administration of 80 mL of iohexol   (OMNIPAQUE ) 350 MG/ML injection. Multiplanar reformatted images are provided for review. Automated exposure control, iterative reconstruction, and/or weight-based adjustment of the mA/kV was utilized to reduce the radiation dose to as low as reasonably achievable. COMPARISON: 08/03/2022, 03/28/2024 CLINICAL HISTORY: Biliary obstruction suspected (Ped 0-17y); Per discussion with IR (perform CT-Abdomen as Pancreas Protocol. FINDINGS: LOWER CHEST: No acute abnormality. LIVER: The liver is unremarkable. GALLBLADDER AND BILE DUCTS: Diffusely dilated and fluid filled gallbladder without wall thickening or radiopaque stones. Similar severe diffuse intrahepatic and extrahepatic biliary ductal dilation with focal short segment narrowing of the mid CBD. No radiopaque choledocholithiasis. SPLEEN: No acute abnormality. 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 ADRENAL GLANDS: No acute abnormality. KIDNEYS, URETERS AND BLADDER: Small nonobstructive bilateral calyceal calculi. No hydronephrosis in either kidney. No perinephric or periureteral stranding. Urinary bladder is unremarkable. GI AND BOWEL: Moderate wall thickening throughout the stomach. Colonic diverticulosis.  No changes of acute diverticulitis. Normal gas filled appendix. Right inguinal hernia containing a short segment of nondistended ileum. No associated bowel obstruction. No extravasation of enteric contrast to suggest bowel perforation. PERITONEUM AND RETROPERITONEUM: No ascites. No free air. VASCULATURE: Aorta is normal in caliber. Retroaortic left renal vein. Diffuse aortoiliac atherosclerosis. LYMPH NODES: No lymphadenopathy. REPRODUCTIVE ORGANS: Severe prostatomegaly. BONES AND SOFT TISSUES: Multilevel degenerative disc disease of the spine. No acute osseous abnormality. No focal soft tissue abnormality. IMPRESSION: 1. No acute intraabdominal or pelvic abnormality. 2. Similar severe, diffuse  intrahepatic and extrahepatic biliary ductal dilation with short segment narrowing of the mid CBD. 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 Follow-up ERCP with EUS recommended for the further characterization. 3. Moderate wall thickening throughout the stomach, which may be due to underdistention or gastritis. 4. Scattered colonic diverticulosis. No changes of acute diverticulitis. 5. Severe prostatomegaly. Electronically signed by: Rogelia Myers MD 05/01/2024 06:01 PM EST RP Workstation: [id number]   Assessment and Plan:  Chest pain Elevated troponin  Presented for scheduled EUS, ERCP Unable to be performed, admitted to hospital for IR consult Developed chest pain overnight 1/5 Troponin 28 ? 248  Initial EKG performed 05/01/2024 showed ST depression in V2-V4 with some STE in lead I/aVL, improved on follow up EKG 05/03/2023 Patient reports episode of chest pain last night 8-9/10 Given oxycodone  and morphine  with resolution  Reports some mild chest discomfort/pressure presently  Reports no issues with anginal symptoms at baseline since having repeat PCI in 12/2022 Home DAPT has been on held since being in the hospital Will review EKG with MD to discuss options moving forward as he is a complex patient and I suspect he will need a cath but has upcoming GI procedures planned, further recommendations to follow   CAD s/p NSTEMI and STEMI 2024 with DES to ramus branch Hyperlipidemia  LHC 12/2022: PCI/DES x 1 to proximal segments of ramus intermediate branch overlapping old stent, moderate mid LAD stenosis, moderate circumflex stenosis with mild RCA stenosis  Home meds: ASA, Effient , Lipitor 80 mg, Toprol  50 mg, telmisartan  80 mg Currently  holding all home meds per primary team   Chronic HFmrEF, 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% Ischemic cardiomyopathy Hypertension  Home meds: metoprolol  XL 50 mg daily, telmisartan  80 mg daily  Repeat echo showed no change post-MI Appears euvolemic on exam  BP has been okay, SBP 120-140s without medications  Currently holding home medications in the setting of possible surgery  Per primary Biliary obstruction  Risk Assessment/Risk Scores:    TIMI Risk Score for Unstable Angina or Non-ST Elevation MI:   The patient's TIMI risk score is 6, which indicates a 41% risk of all cause mortality, new or recurrent myocardial infarction or need for urgent revascularization in the next 14 days.  [place]  Heart Association (NYHA) Functional Class NYHA Class I   For questions or updates, please contact [facility] Please consult [email or web address] for contact info under   Signed, Waddell DELENA Donath, PA-C  05/02/2024 10:40 AM     [1]  Allergies Allergen Reactions   Lisinopril Cough   Nitroglycerin  Other (See Comments)    Blood pressure drop   Penicillins Rash   "

## 2024-05-02 NOTE — Consult Note (Signed)
 "     Chief Complaint: Biliary duct obstruction, recent gastric biopsy with poorly differentiated adenocarcinoma; referred for percutaneous transhepatic cholangiogram with biliary drain placement  Referring Physician(s): Dr Aloha Finner.  Supervising Physician: Hughes Simmonds  Patient Status: [facility] - In-pt  History of Present Illness: Bryan Craig is a 74 y.o. male Spanish speaking. Inpatient. History of CAD (On effient , CHF, cardiomyopathy. HTN,. HLD.  Found to have a gallbladder polyp.Further evaluation of the poly showed biliary and pancreatic duct dilation.  MRCP on 12.2.25 showed a common bile duct stricture. EGD performed. Per surgical notes from  Dr. Aloha Shallow As a result of the patient's stenosis, the  echoendoscope's as well as the duodenoscope will not traverse into the duodenum. Thus, it is not endoscopically feasible for endoscopic drainage of the biliary tree at this time. Team is requesting a biliary drain placement  Found to have elevated troponin/NSTEMI early this am.  Cardiology  consulted. Last dose of effient  and ASA on 12.31.25. 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  Patient also scheduled for cardiac cath on 1/7.   Full code  Past Medical History:  Diagnosis Date   Anemia    CAD (coronary artery disease)    Hepatitis    as a child   History of kidney stones    Hyperlipidemia    Hypertension    Myocardial infarction [facility])    Retinal detachment     Past Surgical History:  Procedure Laterality Date   CORONARY STENT INTERVENTION N/A 07/22/2022   Procedure: CORONARY STENT INTERVENTION;  Surgeon: Verlin Lonni BIRCH, MD;  Location: [facility] INVASIVE CV LAB;  Service: Cardiovascular;  Laterality: N/A;   CORONARY/GRAFT ACUTE MI REVASCULARIZATION N/A 01/24/2023   Procedure: Coronary/Graft Acute MI Revascularization;  Surgeon: Verlin Lonni BIRCH, MD;  Location: [facility] INVASIVE CV LAB;  Service: Cardiovascular;   Laterality: N/A;   EYE SURGERY     LEFT HEART CATH AND CORONARY ANGIOGRAPHY N/A 07/22/2022   Procedure: LEFT HEART CATH AND CORONARY ANGIOGRAPHY;  Surgeon: Verlin Lonni BIRCH, MD;  Location: [facility] INVASIVE CV LAB;  Service: Cardiovascular;  Laterality: N/A;   LEFT HEART CATH AND CORONARY ANGIOGRAPHY N/A 01/24/2023   Procedure: LEFT HEART CATH AND CORONARY ANGIOGRAPHY;  Surgeon: Verlin Lonni BIRCH, MD;  Location: [facility] INVASIVE CV LAB;  Service: Cardiovascular;  Laterality: N/A;    Allergies: Lisinopril, Nitroglycerin , and Penicillins  Medications: Prior to Admission medications  Medication Sig Start Date End Date Taking? Authorizing Provider  aspirin  EC 81 MG tablet Take 1 tablet (81 mg total) by mouth daily. Swallow whole. 07/23/22  Yes Gonfa, Taye T, MD  atorvastatin  (LIPITOR) 80 MG tablet Take 1 tablet (80 mg total) by mouth at bedtime. 02/23/24  Yes Saguier, [place], PA-C  ezetimibe  (ZETIA ) 10 MG tablet TAKE 1 TABLET BY MOUTH EVERY DAY 02/22/24  Yes Court Dorn PARAS, MD  Iron , Ferrous Sulfate , 325 (65 Fe) MG TABS Take 1 tablet by mouth 3 (three) times daily. Patient taking differently: Take 1 tablet by mouth in the morning and at bedtime. 03/31/24  Yes Saguier, [place], PA-C  metoprolol  succinate (TOPROL -XL) 50 MG 24 hr tablet TAKE 1 TABLET (50 MG TOTAL) BY MOUTH DAILY. NEEDS APPT 11/23/23  Yes Saguier, [place], PA-C  ondansetron  (ZOFRAN ) 4 MG tablet Take 1 tablet (4 mg total) by mouth every 8 (eight) hours as needed for nausea or vomiting. 03/30/24  Yes Saguier, [place], PA-C  prasugrel  (EFFIENT ) 10 MG TABS tablet Take 1 tablet (10 mg total) by mouth daily. 01/11/24  Yes Court Dorn PARAS, MD  prednisoLONE acetate (PRED FORTE) 1 % ophthalmic suspension Place 1 drop into the left eye daily.   Yes [provider]  SKYRIZI PEN 150 MG/ML pen Inject into the skin every 3 (three) months. 1 dose every 3 months 02/24/24  Yes [provider]  telmisartan  (MICARDIS ) 40 MG tablet TAKE 1  TABLET BY MOUTH EVERY DAY 04/04/24  Yes Saguier, [place], PA-C  triamcinolone  cream (KENALOG ) 0.1 % Apply 1 Application topically 2 (two) times daily. Patient taking differently: Apply 1 Application topically daily as needed (irritation). 11/11/21  Yes Saguier, [place], PA-C  sodium polystyrene (KAYEXALATE ) 15 GM/60ML suspension Take 60 ml by mouth daily for 4 days Patient not taking: Reported on 05/01/2024 03/11/24   Saguier, Dallas, PA-C     Family History  Problem Relation Age of Onset   Hyperlipidemia Mother    Hypertension Mother    Heart disease Mother    Heart attack Father 82    Social History   Socioeconomic History   Marital status: Married    Spouse name: Lonell   Number of children: 3   Years of education: Not on file   Highest education level: Not on file  Occupational History   Occupation: retired    Comment: civil service fast streamer  Tobacco Use   Smoking status: Never   Smokeless tobacco: Never  Vaping Use   Vaping status: Never Used  Substance and Sexual Activity   Alcohol use: Not Currently   Drug use: Never   Sexual activity: Yes  Other Topics Concern   Not on file  Social History Narrative   Married, 2 daughters with current wife and 1 daughter from previous marriage   Social Drivers of Health   Tobacco Use: Low Risk (05/01/2024)   Patient History    Smoking Tobacco Use: Never    Smokeless Tobacco Use: Never    Passive Exposure: Not on file  Financial Resource Strain: Low Risk (11/30/2023)   Overall Financial Resource Strain (CARDIA)    Difficulty of Paying Living Expenses: Not hard at all  Food Insecurity: No Food Insecurity (05/01/2024)   Epic    Worried About Radiation Protection Practitioner of Food in the Last Year: Never true    Ran Out of Food in the Last Year: Never true  Transportation Needs: No Transportation Needs (05/01/2024)   Epic    Lack of Transportation (Medical): No    Lack of Transportation (Non-Medical): No  Physical Activity: Sufficiently Active  (11/30/2023)   Exercise Vital Sign    Days of Exercise per Week: 7 days    Minutes of Exercise per Session: 50 min  Stress: No Stress Concern Present (11/30/2023)   [employer] of Occupational Health - Occupational Stress Questionnaire    Feeling of Stress: Not at all  Social Connections: Moderately Isolated (05/01/2024)   Social Connection and Isolation Panel    Frequency of Communication with Friends and Family: More than three times a week    Frequency of Social Gatherings with Friends and Family: More than three times a week    Attends Religious Services: Never    Database Administrator or Organizations: No    Attends Banker Meetings: Never    Marital Status: Married  Depression (PHQ2-9): Low Risk (11/30/2023)   Depression (PHQ2-9)    PHQ-2 Score: 0  Alcohol Screen: Low Risk (11/30/2023)   Alcohol Screen    Last Alcohol Screening Score (AUDIT): 2  Housing: Low Risk (05/01/2024)  Epic    Unable to Pay for Housing in the Last Year: No    Number of Times Moved in the Last Year: 0    Homeless in the Last Year: No  Utilities: Not At Risk (05/01/2024)   Epic    Threatened with loss of utilities: No  Health Literacy: Adequate Health Literacy (11/30/2023)   B1300 Health Literacy    Frequency of need for help with medical instructions: Never        Review of Systems currently denies fever, headache, worsening dyspnea, cough, abdominal pain, back pain, nausea, vomiting or bleeding.  He does have some minor left-sided chest discomfort.  Vital Signs: BP (!) 138/91 (BP Location: Right Arm)   Pulse 75   Temp 97.6 F (36.4 C) (Oral)   Resp 17   Ht 5' 4 (1.626 m)   Wt 147 lb (66.7 kg)   SpO2 100%   BMI 25.23 kg/m      Physical Exam; awake, alert.  Chest clear to auscultation bilaterally.  Heart with regular rate and rhythm.  Abdomen soft, positive bowel sounds, nontender.  No lower extremity edema.  Imaging: CT ABDOMEN PELVIS W CONTRAST Result Date:  05/01/2024 EXAM: CT ABDOMEN AND PELVIS WITH CONTRAST 05/01/2024 04:45:00 PM TECHNIQUE: CT of the abdomen and pelvis was performed with the administration of 80 mL of iohexol  (OMNIPAQUE ) 350 MG/ML injection. Multiplanar reformatted images are provided for review. Automated exposure control, iterative reconstruction, and/or weight-based adjustment of the mA/kV was utilized to reduce the radiation dose to as low as reasonably achievable. COMPARISON: 08/03/2022, 03/28/2024 CLINICAL HISTORY: Biliary obstruction suspected (Ped 0-17y); Per discussion with IR (perform CT-Abdomen as Pancreas Protocol. FINDINGS: LOWER CHEST: No acute abnormality. LIVER: The liver is unremarkable. GALLBLADDER AND BILE DUCTS: Diffusely dilated and fluid filled gallbladder without wall thickening or radiopaque stones. Similar severe diffuse intrahepatic and extrahepatic biliary ductal dilation with focal short segment narrowing of the mid CBD. No radiopaque choledocholithiasis. SPLEEN: No acute abnormality. 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 ADRENAL GLANDS: No acute abnormality. KIDNEYS, URETERS AND BLADDER: Small nonobstructive bilateral calyceal calculi. No hydronephrosis in either kidney. No perinephric or periureteral stranding. Urinary bladder is unremarkable. GI AND BOWEL: Moderate wall thickening throughout the stomach. Colonic diverticulosis. No changes of acute diverticulitis. Normal gas filled appendix. Right inguinal hernia containing a short segment of nondistended ileum. No associated bowel obstruction. No extravasation of enteric contrast to suggest bowel perforation. PERITONEUM AND RETROPERITONEUM: No ascites. No free air. VASCULATURE: Aorta is normal in caliber. Retroaortic left renal vein. Diffuse aortoiliac atherosclerosis. LYMPH NODES: No lymphadenopathy. REPRODUCTIVE ORGANS: Severe prostatomegaly. BONES AND SOFT TISSUES: Multilevel degenerative disc  disease of the spine. No acute osseous abnormality. No focal soft tissue abnormality. IMPRESSION: 1. No acute intraabdominal or pelvic abnormality. 2. Similar severe, diffuse intrahepatic and extrahepatic biliary ductal dilation with short segment narrowing of the mid CBD. 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 Follow-up ERCP with EUS recommended for the further characterization. 3. Moderate wall thickening throughout the stomach, which may be due to underdistention or gastritis. 4. Scattered colonic diverticulosis. No changes of acute diverticulitis. 5. Severe prostatomegaly. Electronically signed by: Rogelia Myers MD 05/01/2024 06:01 PM EST RP Workstation: [id number]    Labs:  CBC: Recent Labs    04/14/24 1405 05/01/24 0922 05/02/24 0020 05/02/24 0438  WBC 4.4 6.5 4.8 6.0  HGB 9.2* 8.6* 9.3* 8.7*  HCT 30.3* 28.6* 31.5* 29.3*  PLT 468.0* 449* 567* 479*    COAGS: Recent Labs    05/01/24 0922  INR 1.0    BMP: Recent Labs    04/14/24 1405 05/01/24 0922 05/02/24 0020 05/02/24 0438  NA 137 136 136 135  K 4.1 4.3 4.1 4.1  CL 102 103 103 102  CO2 30 26 24 25   GLUCOSE 99 129* 140* 137*  BUN 13 11 13 12   CALCIUM  9.1 8.0* 8.8* 8.2*  CREATININE 0.65 0.64 0.70 0.62  GFRNONAA  --  >60 >60 >60    LIVER FUNCTION TESTS: Recent Labs    04/03/24 1117 04/14/24 1405 05/01/24 0922 05/02/24 0438  BILITOT 0.7 0.9 2.0* 1.3*  AST 294* 327* 248* 187*  ALT 397* 418* 339* 311*  ALKPHOS 833* 1,016* 1,114* 1,039*  PROT 6.2 5.7* 5.0* 5.3*  ALBUMIN 3.6 3.7 2.8* 2.9*    TUMOR MARKERS: Recent Labs    04/14/24 1405  AFPTM 1.6  CA199 356*    Assessment and Plan: 74 y.o. male Spanish speaking. Inpatient. History of  CAD (On effient , CHF, cardiomyopathy. HTN,. HLD.  Found to have a gallbladder polyp.Further evaluation of the poly showed biliary and pancreatic duct dilation.  MRCP on 12.2.25 showed a common bile duct stricture. EGD performed. Per surgical notes from  Dr. Aloha Shallow As a result of the patient's stenosis, the  echoendoscope's as well as the duodenoscope will not traverse into the duodenum. Thus, it is not endoscopically feasible for endoscopic drainage of the biliary tree at this time. Team is requesting a biliary drain placement  Found to have elevated troponin/NSTEMI early this am.  Cardiology  consulted. Last dose of effient  and ASA on 12.31.25. Preliminary results from EGD resulted in piiiiiioorly differentiated adenocarcinoma.  Patient also scheduled for cardiac cath on 1/7.  Latest imaging studies have been reviewed by Dr. Hughes and case discussed extensively with GI team and cardiology.  Details/risks of percutaneous transhepatic cholangiogram with biliary drain placement, including but not limited to, internal bleeding, infection, injury to adjacent structures, need for prolonged drainage, and death discussed with patient and spouse with their understanding and consent.  Plan is to proceed with PTC/biliary drain placement this afternoon before anticoagulation restarted and patient will subsequently undergo cardiac cath on 1/7.   Thank you for this interesting consult.  I greatly enjoyed meeting Denarius Sesler and look forward to participating in their care.  A copy of this report was sent to the requesting provider on this date.  Electronically Signed: Delon JAYSON Beagle, NP/Kevin Amamda Curbow,PA-C 05/02/2024, 11:09 AM   I spent a total of  40 minutes   in face to face in clinical consultation, greater than 50% of which was counseling/coordinating care for image guided percutaneous transhepatic cholangiogram with biliary drain placement  "

## 2024-05-02 NOTE — Progress Notes (Signed)
 " PROGRESS NOTE    Bryan Craig  FMW:[id number] DOB: 10-01-1950 DOA: 05/01/2024 PCP: Dorina Loving, PA-C   Brief Narrative: 74 y.o. male with medical history significant for CAD on Effient , cardiomyopathy LVEF 44% grade 1 diastolic dysfunction admitted to the hospital for evaluation and management of biliary dilation.  Patient was initially referred to [facility] gastroenterology November 2025 due to elevated LFTs, right upper quadrant ultrasound showed substantial new biliary and dorsal pancreatic duct dilatation, he underwent MRCP 03/28/2024 with focal 1 cm length distal common bile duct stricture.  He was brought to [facility] today for elective EUS and ERCP, Dr. 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  Biopsies were taken, but hospitalist admission was requested along with IR consultation for PTBD and further management.  1/6 -patient developed chest pain and dizziness and a cardiac workup was started and was found with troponins trending up with EKG changes ST elevation in lead I and aVL ST depression in V2 through V4.  Cardiology was consulted patient was transferred to the ICU.  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 I have consulted Dr. Sherrod who has asked Dr. Federico to see this patient tomorrow.  GI following.  Appreciate interventional radiology. Follow-up LFTs in a.m.   #2 NSTEMI history of CAD status post PCI/ischemic cardiomyopathy now presenting with chest pain and elevated troponin and EKG changes  consistent with MI.  Plan is for him to go to cath tomorrow plus or minus PCI.  Cardiology following and has him on aspirin  and Toprol  and ARB.  Follow-up echocardiogram. EKG showed lateral ST elevation in anterior ST depression.  Anticoagulation per cardiology.  N.p.o. after midnight.  #3 hyperlipidemia on statin held due to elevated LFTs   Estimated body mass index is 23.62 kg/m as calculated from the following:   Height as of this encounter: 5' 7 (1.702 m).   Weight as of this encounter: 68.4 kg.  DVT prophylaxis: none  code Status: Full code Family Communication: Daughters at bedside  disposition Plan:  Status is: Inpatient Remains inpatient appropriate because: Acute MI newly diagnosed gastric carcinoma   Consultants:  GI, cardiology, interventional radiology, oncology  Procedures: Percutaneous transhepatic biliary tube 05/02/2024 Antimicrobials: None  Subjective: Resting in bed daughters at bedside complains of chest pain sore cannot take nitroglycerin  due to severe hypotension in the past  Objective: Vitals:   05/02/24 1730 05/02/24 1735 05/02/24 1740 05/02/24 1745  BP: (!) 143/98 (!) 136/91 (!) 140/94 99/79  Pulse: 85 88 (!) 101 79  Resp: 20 18 20  (!) 22  Temp:      TempSrc:      SpO2: 100% 100% 100% 99%  Weight:      Height:        Intake/Output Summary (Last 24 hours) at 05/02/2024 1843 Last data filed at 05/02/2024 1630 Gross per 24 hour  Intake 2185.52 ml  Output 950 ml  Net 1235.52 ml   Filed Weights   05/01/24 0659 05/02/24 1121  Weight: 66.7 kg 68.4 kg    Examination:  General exam:  Appears in no acute distress Respiratory system: Clear to auscultation. Respiratory effort normal. Cardiovascular system: S1 & S2 heard, RRR. No JVD, murmurs, rubs, gallops or clicks. No pedal edema. Gastrointestinal system: Abdomen is nondistended, soft and nontender. No organomegaly or masses felt. Normal bowel sounds heard. Central nervous system: Alert and oriented.  No focal neurological deficits. Extremities: No edema   Data Reviewed: I have personally reviewed following labs and imaging studies  CBC: Recent Labs  Lab 05/01/24 0922 05/02/24 0020 05/02/24 0438  WBC 6.5 4.8 6.0  HGB 8.6* 9.3* 8.7*  HCT 28.6* 31.5* 29.3*  MCV 67.0* 67.0* 67.0*  PLT 449* 567* 479*   Basic Metabolic Panel: Recent Labs  Lab 05/01/24 0922 05/02/24 0020 05/02/24 0438  NA 136 136 135  K 4.3 4.1 4.1  CL 103 103 102  CO2 26 24 25   GLUCOSE 129* 140* 137*  BUN 11 13 12   CREATININE 0.64 0.70 0.62  CALCIUM  8.0* 8.8* 8.2*  MG  --  2.2  --    GFR: Estimated Creatinine Clearance: 75.7 mL/min (by C-G formula based on SCr of 0.62 mg/dL). Liver Function Tests: Recent Labs  Lab 05/01/24 0922 05/02/24 0438  AST 248* 187*  ALT 339* 311*  ALKPHOS 1,114* 1,039*  BILITOT 2.0* 1.3*  PROT 5.0* 5.3*  ALBUMIN 2.8* 2.9*   No results for input(s): LIPASE, AMYLASE in the last 168 hours. No results for input(s): AMMONIA in the last 168 hours. Coagulation Profile: Recent Labs  Lab 05/01/24 0922  INR 1.0   Cardiac Enzymes: No results for input(s): CKTOTAL, CKMB, CKMBINDEX, TROPONINI in the last 168 hours. BNP (last 3 results) No results for input(s): PROBNP in the last 8760 hours. HbA1C: No results for input(s): HGBA1C in the last 72 hours. CBG: Recent Labs  Lab 05/01/24 2249  GLUCAP 143*   Lipid Profile: No results for input(s): CHOL, HDL, LDLCALC, TRIG, CHOLHDL, LDLDIRECT in the last 72 hours. Thyroid  Function Tests: No results for input(s): TSH, T4TOTAL, FREET4, T3FREE, THYROIDAB in the last 72 hours. Anemia Panel: No results for input(s): VITAMINB12, FOLATE, FERRITIN, TIBC, IRON , RETICCTPCT in the last 72 hours. Sepsis Labs: No results for input(s): PROCALCITON, LATICACIDVEN in the last 168 hours.  Recent Results (from the past 240 hours)  MRSA Next Gen by PCR, Nasal     Status: None    Collection Time: 05/02/24 11:35 AM   Specimen: Nasal Mucosa; Nasal Swab  Result Value Ref Range Status   MRSA by PCR Next Gen NOT DETECTED NOT DETECTED Final    Comment: (NOTE) The GeneXpert MRSA Assay (FDA approved for NASAL specimens only), is one component of a comprehensive MRSA colonization surveillance program. It is not intended to diagnose MRSA infection nor to guide or monitor treatment for MRSA infections. Test performance is not FDA approved in patients less than 11 years old. Performed at [facility], [address], [place], [place] [address]          Radiology Studies: DG CHEST PORT 1 VIEW Result Date: 05/02/2024 CLINICAL DATA:  Chest pain. Status post upper endoscopy and endoscopic ultrasound yesterday. EXAM: PORTABLE CHEST 1 VIEW COMPARISON:  01/24/2023. FINDINGS: Normal-sized heart. Decreased inspiration with mild bibasilar linear atelectasis. Remainder of the lungs are clear. No pneumothorax or pneumoperitoneum seen. Mild thoracolumbar spine degenerative changes. IMPRESSION: Decreased inspiration with mild bibasilar linear atelectasis. Electronically Signed   By: Elspeth Bathe M.D.   On: 05/02/2024 16:45   ECHOCARDIOGRAM COMPLETE Result Date: 05/02/2024    ECHOCARDIOGRAM  REPORT   Patient Name:   SIDI DZIKOWSKI Date of Exam: 05/02/2024 Medical Rec #:  [id number]     Height:       67.0 in Accession #:    [id number]    Weight:       150.8 lb Date of Birth:  [birth year]     BSA:          1.794 m Patient Age:    73 years      BP:           155/129 mmHg Patient Gender: M             HR:           82 bpm. Exam Location:  Inpatient Procedure: 2D Echo, Cardiac Doppler, Color Doppler and Intracardiac            Opacification Agent (Both Spectral and Color Flow Doppler were            utilized during procedure). Indications:    NSTEMI I21.4  History:        Patient has prior history of Echocardiogram examinations, most                 recent 06/15/2023. CAD; Risk  Factors:Hypertension.  Sonographer:    Gwendolynn Blush RDCS Referring Phys: [id number] BRIAN S CRENSHAW IMPRESSIONS  1. Left ventricular ejection fraction, by estimation, is 40 to 45%. The left ventricle has mildly decreased function. The left ventricle demonstrates regional wall motion abnormalities (see scoring diagram/findings for description). Left ventricular diastolic parameters are consistent with Grade I diastolic dysfunction (impaired relaxation).  2. Right ventricular systolic function is normal. The right ventricular size is normal.  3. The mitral valve is grossly normal. Mild to moderate mitral valve regurgitation. No evidence of mitral stenosis.  4. The aortic valve was not well visualized. Aortic valve regurgitation is trivial. No aortic stenosis is present. Comparison(s): No significant change from prior study. Prior images reviewed side by side. FINDINGS  Left Ventricle: Left ventricular ejection fraction, by estimation, is 40 to 45%. The left ventricle has mildly decreased function. The left ventricle demonstrates regional wall motion abnormalities. Definity  contrast agent was given IV to delineate the left ventricular endocardial borders. The left ventricular internal cavity size was normal in size. There is no left ventricular hypertrophy. Left ventricular diastolic parameters are consistent with Grade I diastolic dysfunction (impaired relaxation).  LV Wall Scoring: The apical lateral segment, apical anterior segment, and apical inferior segment are akinetic. The posterior wall is hypokinetic. The apical septal segment is normal. Right Ventricle: The right ventricular size is normal. No increase in right ventricular wall thickness. Right ventricular systolic function is normal. Left Atrium: Left atrial size was normal in size. Right Atrium: Right atrial size was normal in size. Pericardium: There is no evidence of pericardial effusion. Mitral Valve: The mitral valve is grossly normal. Mild to moderate  mitral valve regurgitation. No evidence of mitral valve stenosis. Tricuspid Valve: The tricuspid valve is normal in structure. Tricuspid valve regurgitation is not demonstrated. No evidence of tricuspid stenosis. Aortic Valve: The aortic valve was not well visualized. Aortic valve regurgitation is trivial. No aortic stenosis is present. Pulmonic Valve: The pulmonic valve was not well visualized. Pulmonic valve regurgitation is not visualized. No evidence of pulmonic stenosis. Aorta: The aortic root and ascending aorta are structurally normal, with no evidence of dilitation. IAS/Shunts: No atrial level shunt detected by color flow Doppler.  LEFT VENTRICLE PLAX 2D LVIDd:  5.10 cm      Diastology LVIDs:         4.50 cm      LV e' medial:    6.96 cm/s LV PW:         1.00 cm      LV E/e' medial:  9.8 LV IVS:        1.20 cm      LV e' lateral:   6.64 cm/s LVOT diam:     2.00 cm      LV E/e' lateral: 10.3 LV SV:         72 LV SV Index:   40 LVOT Area:     3.14 cm  LV Volumes (MOD) LV vol d, MOD A2C: 162.0 ml LV vol d, MOD A4C: 146.0 ml LV vol s, MOD A2C: 90.1 ml LV vol s, MOD A4C: 84.8 ml LV SV MOD A2C:     71.9 ml LV SV MOD A4C:     146.0 ml LV SV MOD BP:      64.9 ml RIGHT VENTRICLE RV S prime:     14.00 cm/s TAPSE (M-mode): 1.8 cm LEFT ATRIUM             Index        RIGHT ATRIUM           Index LA diam:        3.60 cm 2.01 cm/m   RA Area:     10.10 cm LA Vol (A2C):   46.1 ml 25.70 ml/m  RA Volume:   19.20 ml  10.71 ml/m LA Vol (A4C):   32.9 ml 18.34 ml/m LA Biplane Vol: 40.2 ml 22.41 ml/m  AORTIC VALVE LVOT Vmax:   110.00 cm/s LVOT Vmean:  68.600 cm/s LVOT VTI:    0.230 m  AORTA Ao Root diam: 3.50 cm Ao Asc diam:  3.00 cm MITRAL VALVE               TRICUSPID VALVE MV Area (PHT): 3.86 cm    TR Peak grad:   39.2 mmHg MV E velocity: 68.10 cm/s  TR Vmax:        313.00 cm/s MV A velocity: 98.10 cm/s MV E/A ratio:  0.69        SHUNTS                            Systemic VTI:  0.23 m                             Systemic Diam: 2.00 cm Stanly Leavens MD Electronically signed by Stanly Leavens MD Signature Date/Time: 05/02/2024/3:15:29 PM    Final    CT ABDOMEN PELVIS W CONTRAST Result Date: 05/01/2024 EXAM: CT ABDOMEN AND PELVIS WITH CONTRAST 05/01/2024 04:45:00 PM TECHNIQUE: CT of the abdomen and pelvis was performed with the administration of 80 mL of iohexol  (OMNIPAQUE ) 350 MG/ML injection. Multiplanar reformatted images are provided for review. Automated exposure control, iterative reconstruction, and/or weight-based adjustment of the mA/kV was utilized to reduce the radiation dose to as low as reasonably achievable. COMPARISON: 08/03/2022, 03/28/2024 CLINICAL HISTORY: Biliary obstruction suspected (Ped 0-17y); Per discussion with IR (perform CT-Abdomen as Pancreas Protocol. FINDINGS: LOWER CHEST: No acute abnormality. LIVER: The liver is unremarkable. GALLBLADDER AND BILE DUCTS: Diffusely dilated and fluid filled gallbladder without wall thickening or radiopaque stones. Similar severe diffuse intrahepatic and extrahepatic biliary ductal  dilation with focal short segment narrowing of the mid CBD. No radiopaque choledocholithiasis. SPLEEN: No acute abnormality. 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 ADRENAL GLANDS: No acute abnormality. KIDNEYS, URETERS AND BLADDER: Small nonobstructive bilateral calyceal calculi. No hydronephrosis in either kidney. No perinephric or periureteral stranding. Urinary bladder is unremarkable. GI AND BOWEL: Moderate wall thickening throughout the stomach. Colonic diverticulosis. No changes of acute diverticulitis. Normal gas filled appendix. Right inguinal hernia containing a short segment of nondistended ileum. No associated bowel obstruction. No extravasation of enteric contrast to suggest bowel perforation. PERITONEUM AND RETROPERITONEUM: No ascites. No free air. VASCULATURE: Aorta is normal in caliber.  Retroaortic left renal vein. Diffuse aortoiliac atherosclerosis. LYMPH NODES: No lymphadenopathy. REPRODUCTIVE ORGANS: Severe prostatomegaly. BONES AND SOFT TISSUES: Multilevel degenerative disc disease of the spine. No acute osseous abnormality. No focal soft tissue abnormality. IMPRESSION: 1. No acute intraabdominal or pelvic abnormality. 2. Similar severe, diffuse intrahepatic and extrahepatic biliary ductal dilation with short segment narrowing of the mid CBD. 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 Follow-up ERCP with EUS recommended for the further characterization. 3. Moderate wall thickening throughout the stomach, which may be due to underdistention or gastritis. 4. Scattered colonic diverticulosis. No changes of acute diverticulitis. 5. Severe prostatomegaly. Electronically signed by: Rogelia Myers MD 05/01/2024 06:01 PM EST RP Workstation: [id number]    Scheduled Meds:  [START ON 05/03/2024] aspirin  EC  81 mg Oral Daily   Chlorhexidine  Gluconate Cloth  6 each Topical Daily   irbesartan   150 mg Oral Daily   lidocaine -EPINEPHrine   20 mL Intradermal Once   metoprolol  succinate  50 mg Oral Daily   pantoprazole  (PROTONIX ) IV  40 mg Intravenous Daily   sodium chloride  flush  5 mL Intracatheter Q8H   Continuous Infusions:  sodium chloride  100 mL/hr at 05/02/24 1808     LOS: 1 day     Almarie KANDICE Hoots, MD 05/02/2024, 6:43 PM   "

## 2024-05-02 NOTE — Progress Notes (Signed)
 GI Pathology Note  The results of my gastric biopsies performed at time of EUS/EGD yesterday have returned as follows.  The inpatient medical service has been updated about this as well as the inpatient [facility] service.   FINAL MICROSCOPIC DIAGNOSIS:  A. 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  B. 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  C. GASTRIC ULCER, DISTAL, BIOPSY:  - Moderate to poorly differentiated adenocarcinoma with focal signet  ring cell morphology.   These findings are most consistent with a diffuse gastric cancer since even the random gastric biopsies ended up showing evidence of adenocarcinoma.  Again, per EUS report yesterday, no ability for any site of endoscopic biliary decompression is possible due to the significant gastric stenosis that is present.  Enteral stenting could be considered at some point if necessary.  Biggest issue at this time, as per medicine and GI and cardiology notes at this point are patient had a likely cardiac event last night in the setting of his admission while awaiting percutaneous transhepatic biliary drainage for his progressive biliary dilation.  He has been off his antiplatelet therapy, but there is concern that further cardiac optimization may not be possible until cardiac evaluation is pursued further (which may include repeat cardiac catheterization and repeat PCI interventions).  However if patient needs to go back on antiplatelet therapy or dual antiplatelet therapy, the ability to come off his Effient  would not likely be possible for months into the future (per cardiology).  This is difficult because he has progressive  biliary obstruction and at some point will become fully jaundiced; this could preclude potential chemotherapeutics in the future.    There is risk for cholangitis is less likely in individuals who have cancer related obstruction, but it is not impossible either.  If no endoscopic drainage is pursued, it will carry some risks in the future.  The question at hand is whether IR would feel comfortable about potentially placing a PTBD on antiplatelet therapy or dual antiplatelet therapy.  If not, then would patient undergo a diagnostic catheterization to evaluate what may need to be done before being placed on the DAPT, not sure.  Medicine and inpatient GI and inpatient cardiology will continue conversations with inpatient interventional radiology.  I am happy to be available as I can, though I am currently not the inpatient GI provider this week.   Aloha Finner, MD [facility] Advanced Endoscopy Office # [phone]

## 2024-05-02 NOTE — Progress Notes (Addendum)
 "  Referring Provider: Dr. Almarie Ku Primary Care Physician:  Dorina Loving, PA-C Primary Gastroenterologist:  Dr. Wilhelmenia   Reason for Consultation:  Biliary and pancreatic ductal dilatation   HPI: Bryan Craig is a 74 y.o. male with past medical history of hypertension, hyperlipidemia, coronary artery disease s/p stent placement x 27 June 2022 on Effient  and ASA, psoriasis on Skyrizi, retinal detachment, anemia and elevated LFTs recently identified per routine labs per his PCP Nov 2025 and images studies concerning for biliary and pancreatic duct strictures. RUQ sonogram 03/21/2024 identified a moderately distended gallbladder with portal lesions, gallbladder sludge and a suspected 10 mm gallstone near the bladder neck papillary, dorsal pancreatic ductal dilatation, pancreatic mass could not be excluded. Hepatic steatosis was also noted.  He subsequently underwent an abdominal MRI/MRCP with and without contrast 03/28/2024 which identified a 1 cm distal CBD stricture near the pancreatic head resulting in proximal biliary dilatation, a 1 cm stricture in the dorsal pancreatic duct in the pancreatic head, low critic edema suspicious for pancreatitis and no discrete pancreatic mass was identified. Mildly accentuated gastric mucosal enhancement suggestive of gastritis was also noted.    He was seen in our GI clinic by Delon Failing PA-C 04/14/2024 and he was scheduled for an outpatient EUS and ERCP at [facility] with Dr. Wilhelmenia 04/2024 to further evaluate  biliary and pancreatic ductal dilatation in setting of elevated LFTs and CA19-9 levels. EUS showed evidence of pancreatic atrophy, distal neck and head of pancreas were not visualized. The EGD identified a nonobstructing nonbleeding ulcer within the cardia/fundus/proximal body and a partially obstructing nonbleeding gastric ulcer within the middle body and distal body and antrum with significant gastric stenosis. The Gastric stenosis prevented  ability to proceed with ERCP attempt therefore patient was admitted for PTBD per IR to include brushings of the biliary stricture.  Patient developed dizziness with chest pain when ambulating to the bathroom around 11pm last night. A  twelve-lead EKG was completed and was reviewed by cardiologist Dr. Cesario, patient did not meet criteria for STEMI, however, troponin levels were elevated at Troponin 28 -> 248.  Last dose of Effient  was on 12/31, held prior to EGD/EUS 05/01/2024 as noted above.  A cardiology consult was requested.  Labs today at 00: 20: WBC 4.8.  Hemoglobin 9.3 up from 8.6 on 1/5.  Hematocrit 31.5.  MCV 67.  Platelets 567.  Sodium 136.  Potassium 4 point.  BUN 13.  Creatinine 0.70.  Magnesium 2.2.  Labs today at 4:38 am: Hemoglobin 8.7.  Hematocrit 29.3.  Platelets 479.  Total bili 2.0 -> 1.3. Alk phos 1,114 -> 1,039. AST 248 -> 187. ALT 339 -> 311.   He endorses having mid chest soreness at this time which has persisted since around 11 PM last night.  No nausea or vomiting.  No upper or lower abdominal pain.  He passed a brown formed stool last night, no melena or bright red blood per the rectum.  Last dose of Effient  was 12/31.  He takes ASA 81 mg daily at home, no other NSAID use.  Non-smoker.  Rare alcohol intake.  No prior history of GERD or ulcers, never had an EGD prior to 05/01/2024.  Never had a screening colonoscopy.  No known family history of esophageal, gastric, colon, liver, biliary or pancreatic cancer.  He endorsed having ongoing fatigue for the past 2 to 3 months diet he had 18 pound weight loss over the past year. Two daughters at the bedside.  GI PROCEDURES:  EGD Impression:  - No gross lesions in the entire esophagus. Z-line irregular, 41 cm from the incisors.  - 1 cm hiatal hernia.  - Non-obstructing non-bleeding gastric ulcer within the cardia/fundus/proximal body. Biopsied.  - Congested, erythematous, friable (with contact bleeding) and granular mucosa in the gastric  body. Biopsied.  - Partially obstructing non-bleeding gastric ulcer within the middle body and distal body and antrum. This leads to narrowing in the antrum. Biopsied.  - Significant gastric stenosis only able to be traversed with the adult endoscope (linear/radial echoendoscopes cannot traverse)  - No gross lesions in the prepyloric region.  - No gross lesions in the duodenal bulb, in the first portion of the duodenum and in the second portion of the duodenum.   EUS impression:  - Pancreatic parenchymal abnormalities consisting of atrophy were noted in the genu of the pancreas, pancreatic body and pancreatic tail. Cannot visualize the distal neck and head of pancreas.  - The pancreatic duct had a dilated endosonographic appearance, had an irregularly contoured endosonographic appearance, had a prominently branched endosonographic appearance and had a tortuous/ectatic appearance in the genu of the pancreas, body of the pancreas and tail of the pancreas. The pancreatic duct measured up to 8 mm in diameter within the neck and tapered proximally.  - Wall thickening was seen in the cardia of the stomach, in the fundus of the stomach and in the body of the stomach. The thickening involved the areas of ulceration within the proximal stomach and the middle stomach. Could not visualize the distal stomach as a result of the stenosis noted.  - No malignant-appearing lymph nodes were visualized in the paracardial region (level 16) and left gastric region (level 17).  - Cannot visualize biliary tree from the stomach lining either. - As a result of the patient's stenosis, the echoendoscope's as well as the duodenoscope will not traverse into the duodenum. Thus, it is not endoscopically feasible for endoscopic drainage of the biliary tree at this time. The patient will need to be admitted for PTBD and brushings of the biliary stricture.  Past Medical History:  Diagnosis Date   Anemia    CAD (coronary artery  disease)    Hepatitis    as a child   History of kidney stones    Hyperlipidemia    Hypertension    Myocardial infarction [facility])    Retinal detachment    Past Surgical History:  Procedure Laterality Date   CORONARY STENT INTERVENTION N/A 07/22/2022   Procedure: CORONARY STENT INTERVENTION;  Surgeon: Verlin Lonni BIRCH, MD;  Location: [facility] INVASIVE CV LAB;  Service: Cardiovascular;  Laterality: N/A;   CORONARY/GRAFT ACUTE MI REVASCULARIZATION N/A 01/24/2023   Procedure: Coronary/Graft Acute MI Revascularization;  Surgeon: Verlin Lonni BIRCH, MD;  Location: [facility] INVASIVE CV LAB;  Service: Cardiovascular;  Laterality: N/A;   EYE SURGERY     LEFT HEART CATH AND CORONARY ANGIOGRAPHY N/A 07/22/2022   Procedure: LEFT HEART CATH AND CORONARY ANGIOGRAPHY;  Surgeon: Verlin Lonni BIRCH, MD;  Location: [facility] INVASIVE CV LAB;  Service: Cardiovascular;  Laterality: N/A;   LEFT HEART CATH AND CORONARY ANGIOGRAPHY N/A 01/24/2023   Procedure: LEFT HEART CATH AND CORONARY ANGIOGRAPHY;  Surgeon: Verlin Lonni BIRCH, MD;  Location: [facility] INVASIVE CV LAB;  Service: Cardiovascular;  Laterality: N/A;    Prior to Admission medications  Medication Sig Start Date End Date Taking? Authorizing Provider  aspirin  EC 81 MG tablet Take 1 tablet (81 mg total) by mouth daily. Swallow whole. 07/23/22  Yes Gonfa,  Mignon DASEN, MD  atorvastatin  (LIPITOR) 80 MG tablet Take 1 tablet (80 mg total) by mouth at bedtime. 02/23/24  Yes Saguier, [place], PA-C  ezetimibe  (ZETIA ) 10 MG tablet TAKE 1 TABLET BY MOUTH EVERY DAY 02/22/24  Yes Court Dorn PARAS, MD  Iron , Ferrous Sulfate , 325 (65 Fe) MG TABS Take 1 tablet by mouth 3 (three) times daily. Patient taking differently: Take 1 tablet by mouth in the morning and at bedtime. 03/31/24  Yes Saguier, [place], PA-C  metoprolol  succinate (TOPROL -XL) 50 MG 24 hr tablet TAKE 1 TABLET (50 MG TOTAL) BY MOUTH DAILY. NEEDS APPT 11/23/23  Yes Saguier, [place], PA-C  ondansetron  (ZOFRAN ) 4 MG tablet  Take 1 tablet (4 mg total) by mouth every 8 (eight) hours as needed for nausea or vomiting. 03/30/24  Yes Saguier, [place], PA-C  prasugrel  (EFFIENT ) 10 MG TABS tablet Take 1 tablet (10 mg total) by mouth daily. 01/11/24  Yes Court Dorn PARAS, MD  prednisoLONE acetate (PRED FORTE) 1 % ophthalmic suspension Place 1 drop into the left eye daily.   Yes [provider]  SKYRIZI PEN 150 MG/ML pen Inject into the skin every 3 (three) months. 1 dose every 3 months 02/24/24  Yes [provider]  telmisartan  (MICARDIS ) 40 MG tablet TAKE 1 TABLET BY MOUTH EVERY DAY 04/04/24  Yes Saguier, [place], PA-C  triamcinolone  cream (KENALOG ) 0.1 % Apply 1 Application topically 2 (two) times daily. Patient taking differently: Apply 1 Application topically daily as needed (irritation). 11/11/21  Yes Saguier, [place], PA-C  sodium polystyrene (KAYEXALATE ) 15 GM/60ML suspension Take 60 ml by mouth daily for 4 days Patient not taking: Reported on 05/01/2024 03/11/24   Saguier, [place], PA-C    Current Facility-Administered Medications  Medication Dose Route Frequency Provider Last Rate Last Admin   0.9 %  sodium chloride  infusion   Intravenous Continuous Zella, Mir M, MD 100 mL/hr at 05/01/24 2300 Infusion Verify at 05/01/24 2300   albuterol  (PROVENTIL ) (2.5 MG/3ML) 0.083% nebulizer solution 2.5 mg  2.5 mg Nebulization Q2H PRN Zella, Mir M, MD       HYDROmorphone  (DILAUDID ) injection 0.5-1 mg  0.5-1 mg Intravenous Q2H PRN Zella, Mir M, MD   0.5 mg at 05/01/24 2316   ondansetron  (ZOFRAN ) tablet 4 mg  4 mg Oral Q6H PRN Zella Katha HERO, MD       Or   ondansetron  (ZOFRAN ) injection 4 mg  4 mg Intravenous Q6H PRN Zella, Mir M, MD       oxyCODONE  (Oxy IR/ROXICODONE ) immediate release tablet 5 mg  5 mg Oral Q4H PRN Zella, Mir M, MD   5 mg at 05/01/24 2349   traZODone  (DESYREL ) tablet 25 mg  25 mg Oral QHS PRN Zella Katha HERO, MD        Allergies as of 04/17/2024 - Review Complete  04/14/2024  Allergen Reaction Noted   Lisinopril  06/19/2010   Nitroglycerin   01/24/2023   Penicillins Rash 06/19/2010    Family History  Problem Relation Age of Onset   Hyperlipidemia Mother    Hypertension Mother    Heart disease Mother    Heart attack Father 75    Social History   Socioeconomic History   Marital status: Married    Spouse name: Lonell   Number of children: 3   Years of education: Not on file   Highest education level: Not on file  Occupational History   Occupation: retired    Comment: civil service fast streamer  Tobacco Use   Smoking status:  Never   Smokeless tobacco: Never  Vaping Use   Vaping status: Never Used  Substance and Sexual Activity   Alcohol use: Not Currently   Drug use: Never   Sexual activity: Yes  Other Topics Concern   Not on file  Social History Narrative   Married, 2 daughters with current wife and 1 daughter from previous marriage   Social Drivers of Health   Tobacco Use: Low Risk (05/01/2024)   Patient History    Smoking Tobacco Use: Never    Smokeless Tobacco Use: Never    Passive Exposure: Not on file  Financial Resource Strain: Low Risk (11/30/2023)   Overall Financial Resource Strain (CARDIA)    Difficulty of Paying Living Expenses: Not hard at all  Food Insecurity: No Food Insecurity (05/01/2024)   Epic    Worried About Radiation Protection Practitioner of Food in the Last Year: Never true    Ran Out of Food in the Last Year: Never true  Transportation Needs: No Transportation Needs (05/01/2024)   Epic    Lack of Transportation (Medical): No    Lack of Transportation (Non-Medical): No  Physical Activity: Sufficiently Active (11/30/2023)   Exercise Vital Sign    Days of Exercise per Week: 7 days    Minutes of Exercise per Session: 50 min  Stress: No Stress Concern Present (11/30/2023)   [employer] of Occupational Health - Occupational Stress Questionnaire    Feeling of Stress: Not at all  Social Connections: Moderately Isolated  (05/01/2024)   Social Connection and Isolation Panel    Frequency of Communication with Friends and Family: More than three times a week    Frequency of Social Gatherings with Friends and Family: More than three times a week    Attends Religious Services: Never    Database Administrator or Organizations: No    Attends Banker Meetings: Never    Marital Status: Married  Catering Manager Violence: Not At Risk (05/01/2024)   Epic    Fear of Current or Ex-Partner: No    Emotionally Abused: No    Physically Abused: No    Sexually Abused: No  Depression (PHQ2-9): Low Risk (11/30/2023)   Depression (PHQ2-9)    PHQ-2 Score: 0  Alcohol Screen: Low Risk (11/30/2023)   Alcohol Screen    Last Alcohol Screening Score (AUDIT): 2  Housing: Low Risk (05/01/2024)   Epic    Unable to Pay for Housing in the Last Year: No    Number of Times Moved in the Last Year: 0    Homeless in the Last Year: No  Utilities: Not At Risk (05/01/2024)   Epic    Threatened with loss of utilities: No  Health Literacy: Adequate Health Literacy (11/30/2023)   B1300 Health Literacy    Frequency of need for help with medical instructions: Never   Review of Systems: Gen: See HPI. CV: Denies chest pain, palpitations or edema. Resp: Denies cough, shortness of breath of hemoptysis.  GI: See HPI.   GU : Denies urinary burning, blood in urine, increased urinary frequency or incontinence. MS: Denies joint pain, muscles aches or weakness. Derm: Denies rash, itchiness, skin lesions or unhealing ulcers. Psych: Denies depression, anxiety, memory loss or confusion. Heme: Denies easy bruising, bleeding. Neuro:  Denies headaches, dizziness or paresthesias. Endo:  Denies any problems with DM, thyroid  or adrenal function.  Physical Exam: Vital signs in last 24 hours: Temp:  [97.4 F (36.3 C)-98.2 F (36.8 C)] 97.6 F (36.4 C) (01/06 0402)  Pulse Rate:  [65-82] 82 (01/06 0402) Resp:  [13-22] 17 (01/06 0402) BP:  (110-148)/(61-99) 138/83 (01/06 0402) SpO2:  [96 %-100 %] 100 % (01/06 0402) Last BM Date : 05/01/24 General:  Alert 74 year old male in no acute distress. Head:  Normocephalic and atraumatic. Eyes:  No scleral icterus. Left pupil deformity (past retinal detachment), Ears:  Normal auditory acuity. Nose:  No deformity, discharge or lesions. Mouth:  Dentition intact. No ulcers or lesions.  Neck:  Supple. No lymphadenopathy or thyromegaly.  Lungs: Breath sounds clear throughout. No wheezes, rhonchi or crackles.  Oxygen 2 L nasal cannula. Heart: Regular rate and rhythm, no murmurs. Abdomen:, Nondistended.  Nontender.  Positive bowel sounds to all 4 quadrants.  No palpable mass. Rectal: Deferred. Musculoskeletal:  Symmetrical without gross deformities.  Pulses:  Normal pulses noted. Extremities:  Without clubbing or edema. SCDs in use. Neurologic:  Alert and  oriented x 4. No focal deficits.  Skin:  Intact without significant lesions or rashes. Psych:  Alert and cooperative. Normal mood and affect.  Intake/Output from previous day: 01/05 0701 - 01/06 0700 In: 3009 [P.O.:240; I.V.:2569; IV Piggyback:200] Out: -  Intake/Output this shift: No intake/output data recorded.  Lab Results: Recent Labs    05/01/24 0922 05/02/24 0020 05/02/24 0438  WBC 6.5 4.8 6.0  HGB 8.6* 9.3* 8.7*  HCT 28.6* 31.5* 29.3*  PLT 449* 567* 479*   BMET Recent Labs    05/01/24 0922 05/02/24 0020 05/02/24 0438  NA 136 136 135  K 4.3 4.1 4.1  CL 103 103 102  CO2 26 24 25   GLUCOSE 129* 140* 137*  BUN 11 13 12   CREATININE 0.64 0.70 0.62  CALCIUM  8.0* 8.8* 8.2*   LFT Recent Labs    05/01/24 0922 05/02/24 0438  PROT 5.0* 5.3*  ALBUMIN 2.8* 2.9*  AST 248* 187*  ALT 339* 311*  ALKPHOS 1,114* 1,039*  BILITOT 2.0* 1.3*  BILIDIR 1.6*  --   IBILI 0.4  --    PT/INR Recent Labs    05/01/24 0922  LABPROT 13.8  INR 1.0   Hepatitis Panel No results for input(s): HEPBSAG, HCVAB, HEPAIGM,  HEPBIGM in the last 72 hours.    Studies/Results: CT ABDOMEN PELVIS W CONTRAST Result Date: 05/01/2024 EXAM: CT ABDOMEN AND PELVIS WITH CONTRAST 05/01/2024 04:45:00 PM TECHNIQUE: CT of the abdomen and pelvis was performed with the administration of 80 mL of iohexol  (OMNIPAQUE ) 350 MG/ML injection. Multiplanar reformatted images are provided for review. Automated exposure control, iterative reconstruction, and/or weight-based adjustment of the mA/kV was utilized to reduce the radiation dose to as low as reasonably achievable. COMPARISON: 08/03/2022, 03/28/2024 CLINICAL HISTORY: Biliary obstruction suspected (Ped 0-17y); Per discussion with IR (perform CT-Abdomen as Pancreas Protocol. FINDINGS: LOWER CHEST: No acute abnormality. LIVER: The liver is unremarkable. GALLBLADDER AND BILE DUCTS: Diffusely dilated and fluid filled gallbladder without wall thickening or radiopaque stones. Similar severe diffuse intrahepatic and extrahepatic biliary ductal dilation with focal short segment narrowing of the mid CBD. No radiopaque choledocholithiasis. SPLEEN: No acute abnormality. 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 ADRENAL GLANDS: No acute abnormality. KIDNEYS, URETERS AND BLADDER: Small nonobstructive bilateral calyceal calculi. No hydronephrosis in either kidney. No perinephric or periureteral stranding. Urinary bladder is unremarkable. GI AND BOWEL: Moderate wall thickening throughout the stomach. Colonic diverticulosis. No changes of acute diverticulitis. Normal gas filled appendix. Right inguinal hernia containing a short segment of nondistended ileum. No associated bowel obstruction. No extravasation of enteric  contrast to suggest bowel perforation. PERITONEUM AND RETROPERITONEUM: No ascites. No free air. VASCULATURE: Aorta is normal in caliber. Retroaortic left renal vein. Diffuse aortoiliac atherosclerosis. LYMPH NODES: No lymphadenopathy.  REPRODUCTIVE ORGANS: Severe prostatomegaly. BONES AND SOFT TISSUES: Multilevel degenerative disc disease of the spine. No acute osseous abnormality. No focal soft tissue abnormality. IMPRESSION: 1. No acute intraabdominal or pelvic abnormality. 2. Similar severe, diffuse intrahepatic and extrahepatic biliary ductal dilation with short segment narrowing of the mid CBD. 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 Follow-up ERCP with EUS recommended for the further characterization. 3. Moderate wall thickening throughout the stomach, which may be due to underdistention or gastritis. 4. Scattered colonic diverticulosis. No changes of acute diverticulitis. 5. Severe prostatomegaly. Electronically signed by: Rogelia Myers MD 05/01/2024 06:01 PM EST RP Workstation: [id number]    IMPRESSION/PLAN:  74 year old male with elevated LFTs with biliary and pancreatic duct dilatation. Prior outpatient RUQ sono 03/21/2024 identified a moderately distended gallbladder with portal lesions, gallbladder sludge and a suspected 10 mm gallstone near the bladder neck papillary, dorsal pancreatic ductal dilatation (pancreatic mass could not be excluded) and hepatic steatosis.   Abdominal MRI/MRCP W/WO contrast 03/28/2024 identified a 1 cm distal CBD stricture near the pancreatic head resulting in proximal biliary dilatation, a 1 cm stricture in the dorsal pancreatic duct in the pancreatic head (no discrete pancreatic mass was identified) and evidence of gastritis.  CA 19-9 level 356 on 12/19  Outpatient EGD/EUS by Dr. Wilhelmenia 05/01/2024 showed a gastric stenosis preventing ability to proceed with ERCP attempt therefore patient was admitted for PTBD per IR to  include brushings of the biliary stricture.  EUS also showed evidence of pancreatic atrophy, distal neck and head of pancreas were not visualized. - Await gastric biopsy results  - Continue to trend LFTs - NPO - IV fluids and pain management per the hospitalist  - Pantoprazole  40mg  IV once daily  - IR consult for percutaneous transhepatic biliary drain - Await further recommendations per Dr. Abran   History of coronary artery disease s/p stent placement x 27 June 2022. Last dose of Effient  was on 12/31. Patient developed chest pain 11pm - midnight. Troponin level 28 -> 248. EKG did not meet criteria for STEMI. - Cardiology consult requested per the hospitalist  - Chest xray  Iron  deficiency anemia. No overt GI bleeding. Hg 9.3 -> 8.7.  Patient passed a brown BM last night, no melena or bright red blood per the rectum.  EGD 1/5 showed a nonobstructing nonbleeding ulcer within the cardia/fundus/proximal body and a partially obstructing nonbleeding gastric ulcer within the middle body and distal body and antrum with significant gastric stenosis.  Patient never had a screening colonoscopy. - Transfuse for hemoglobin level less than 8 - CBC in AM. - Consider IV iron  during this hospital admission   Elida CHRISTELLA Shawl  05/02/2024, 10:11 AM  GI ATTENDING  Interval history data reviewed.  I have discussed the case with Dr. Wilhelmenia in detail.  He has facilitated this patient's care after his procedure yesterday.  Difficult situation for multiple reasons as fully outlined.  It appears that IR is going to proceed first for drainage.  Cardiology intervention thereafter.  From a GI perspective, nothing further to add.  No additional GI at interventions or treatment  recommendations.  We are available as needed.  Will sign off.  Norleen SAILOR. Abran Raddle., M.D. [facility] Division of Gastroenterology    "

## 2024-05-02 NOTE — Plan of Care (Signed)
" °  Problem: Neurological: Goal: Will regain or maintain usual level of consciousness Outcome: Progressing   Problem: Respiratory: Goal: Respiratory status will improve Outcome: Progressing   Problem: Urinary Elimination: Goal: Ability to achieve and maintain adequate urine output Outcome: Progressing   Problem: Pain Managment: Goal: General experience of comfort will improve and/or be controlled Outcome: Progressing   "

## 2024-05-02 NOTE — Procedures (Signed)
 Vascular and Interventional Radiology Procedure Note  Patient: Bryan Craig DOB: 03-23-1951 Medical Record Number: [id number] Note Date/Time: 05/02/2024 5:20 PM   Performing Physician: Thom Hall, MD Assistant(s): None  Diagnosis: Distal CBD obstruction. Q malignant   Procedure:  PERCUTANEOUS TRANSHEPATIC BILIARY TUBE PLACEMENT ANTEROGRADE CHOLANGIOGRAM  Anesthesia: Conscious Sedation Complications: None Estimated Blood Loss: Minimal Specimens:  Cytology and Microbiology  Findings:  Successful placement of 11F R PTBD Shouldering at distal CBD, suspicious for malignant obstruction.  Plan: Flush tube w 5 mL sterile NS q8h and record drain output qShift. Follow up for routine tube evaluation in 6-8 week(s).   See detailed procedure note with images in PACS. The patient tolerated the procedure well without incident or complication and was returned to Recovery in stable condition.    Thom Hall, MD [facility] Radiology   Pager. [phone] Clinic. [phone]

## 2024-05-02 NOTE — Progress Notes (Signed)
" °   05/02/24 0959  TOC Brief Assessment  Insurance and Status Reviewed  Patient has primary care physician Yes (Saguier, [place], PA-C)  Home environment has been reviewed home  Prior level of function: independent  Prior/Current Home Services No current home services  Social Drivers of Health Review SDOH reviewed no interventions necessary  Readmission risk has been reviewed Yes  Transition of care needs no transition of care needs at this time   Following for any disposition needs.  "

## 2024-05-02 NOTE — Significant Event (Signed)
 Rapid Response Event Note   Reason for Call :  Chest pain  Initial Focused Assessment:  Patient alert/oriented x 4, reports diffuse upper/central chest pain as stabbing with severity 9 out of 10 moving into his back.  Vital signs: BP 125/81, HR 71, RR 18, O2 sat 98% RA, oral T 97.8 F Patient denies shortness of breath, nausea, or other distress. No obvious distress observable. VS consistent throughout event. No recent pain medication administered per MAR.    Interventions:  EKG CBG O2 2L via [place]  Dilaudid  as prescribed & documented in [facility] ([place]). (Pt asleep after second dose, but appropriately responsive.) Telemetry added. Labs ordered: CBC, BMP, MAG, Troponins Activity modification overnight.  Plan of Care:  Call if symptoms return. Monitor vitals and pain. See provider note.  Event Summary:   MD Notified: Lynwood Kipper, NP Call Time: 2242 Arrival Time: 2252 End Time: 0010  Alan LITTIE Portugal, RN

## 2024-05-02 NOTE — Plan of Care (Signed)
" °  Problem: Education: Goal: Knowledge of the prescribed therapeutic regimen will improve Outcome: Progressing   Problem: Bowel/Gastric: Goal: Gastrointestinal status for postoperative course will improve Outcome: Progressing   Problem: Cardiac: Goal: Ability to maintain an adequate cardiac output Outcome: Progressing Goal: Will show no evidence of cardiac arrhythmias Outcome: Progressing   Problem: Clinical Measurements: Goal: Ability to maintain clinical measurements within normal limits Outcome: Progressing   Problem: Clinical Measurements: Goal: Cardiovascular complication will be avoided Outcome: Progressing   Problem: Pain Managment: Goal: General experience of comfort will improve and/or be controlled Outcome: Progressing   Problem: Safety: Goal: Ability to remain free from injury will improve Outcome: Progressing   "

## 2024-05-02 NOTE — Progress Notes (Signed)
"  ° °      Overnight   NAME: Bryan Craig MRN: [id number] DOB : 23-Nov-1950    Date of Service   05/02/2024   HPI/Events of Note    Notified by RN for rapid response Patient had ambulated to the bathroom with family member, had a period of slight dizziness with was described as chest pain/discomfort at 8/10.  Patient ambulated back to the bed with continued pain/discomfort but no drop in blood pressure change in heart rate.  Brief history History of CAD on Effient , cardiomyopathy, EF 44%, Grade 1 Diastolic Dysfunction, MI, HTN, Anemia, Kidney Stones.  Bedside visit  Patient is awake and oriented in no obvious distress describes central chest pain with no radiation. Patient is on O2 at 2 LPM at start of Rapid response. Did not Drop BP Did not pass out. Did not drop SPO2 Did not have heart rate change   Initial EKG shows some changes evident.  Patient was given available prescribed Pain medication IV. Had not had previous doses.  Discussed EKG and event with Dr Cesario (Cardiology In-house) who did not recommend further, other than Lab work (in progress at time of call) and call back as needed dependant on the labs or further event.   Also stated Patient did not meet criteria for STEMI.  At conclusion of visit patient is resting in bed and somewhat drowsy. Labs are pending.   Interventions/ Plan   AM EKG  CBC BMP Mag  Trop cycle ( 2 draws) Reduce ambulation for tonight for rest  Recall to Cards in-House as needed       Lynwood Kipper BSN MSNA MSN ACNPC-AG Acute Care Nurse Practitioner [facility] [place]  "

## 2024-05-03 ENCOUNTER — Inpatient Hospital Stay (HOSPITAL_COMMUNITY)

## 2024-05-03 ENCOUNTER — Encounter (HOSPITAL_COMMUNITY): Admission: RE | Payer: Self-pay | Source: Home / Self Care | Attending: Internal Medicine

## 2024-05-03 ENCOUNTER — Encounter (HOSPITAL_COMMUNITY): Admission: RE | Disposition: A | Payer: Self-pay | Source: Home / Self Care | Attending: Critical Care Medicine

## 2024-05-03 ENCOUNTER — Encounter: Payer: Self-pay | Admitting: Cardiovascular Disease

## 2024-05-03 DIAGNOSIS — D630 Anemia in neoplastic disease: Secondary | ICD-10-CM

## 2024-05-03 DIAGNOSIS — T82867A Thrombosis of cardiac prosthetic devices, implants and grafts, initial encounter: Secondary | ICD-10-CM

## 2024-05-03 DIAGNOSIS — I2102 ST elevation (STEMI) myocardial infarction involving left anterior descending coronary artery: Secondary | ICD-10-CM

## 2024-05-03 DIAGNOSIS — Z955 Presence of coronary angioplasty implant and graft: Secondary | ICD-10-CM

## 2024-05-03 DIAGNOSIS — I2510 Atherosclerotic heart disease of native coronary artery without angina pectoris: Secondary | ICD-10-CM

## 2024-05-03 DIAGNOSIS — D75838 Other thrombocytosis: Secondary | ICD-10-CM

## 2024-05-03 HISTORY — PX: RIGHT HEART CATH: CATH118263

## 2024-05-03 HISTORY — PX: LEFT HEART CATH AND CORONARY ANGIOGRAPHY: CATH118249

## 2024-05-03 HISTORY — PX: CORONARY THROMBECTOMY: CATH118304

## 2024-05-03 HISTORY — PX: IABP INSERTION: CATH118242

## 2024-05-03 HISTORY — PX: CORONARY/GRAFT ACUTE MI REVASCULARIZATION: CATH118305

## 2024-05-03 HISTORY — PX: CORONARY ANGIOGRAPHY: CATH118303

## 2024-05-03 HISTORY — PX: CORONARY STENT INTERVENTION: CATH118234

## 2024-05-03 LAB — POCT I-STAT EG7
Acid-Base Excess: 0 mmol/L (ref 0.0–2.0)
Acid-base deficit: 1 mmol/L (ref 0.0–2.0)
Bicarbonate: 26.7 mmol/L (ref 20.0–28.0)
Bicarbonate: 23.7 mmol/L (ref 20.0–28.0)
Calcium, Ion: 1.09 mmol/L — ABNORMAL LOW (ref 1.15–1.40)
Calcium, Ion: 1.05 mmol/L — ABNORMAL LOW (ref 1.15–1.40)
HCT: 34 % — ABNORMAL LOW (ref 39.0–52.0)
HCT: 34 % — ABNORMAL LOW (ref 39.0–52.0)
Hemoglobin: 11.6 g/dL — ABNORMAL LOW (ref 13.0–17.0)
Hemoglobin: 11.6 g/dL — ABNORMAL LOW (ref 13.0–17.0)
O2 Saturation: 27 %
O2 Saturation: 53 %
Potassium: 4 mmol/L (ref 3.5–5.1)
Potassium: 3.9 mmol/L (ref 3.5–5.1)
Sodium: 134 mmol/L — ABNORMAL LOW (ref 135–145)
Sodium: 134 mmol/L — ABNORMAL LOW (ref 135–145)
TCO2: 28 mmol/L (ref 22–32)
TCO2: 25 mmol/L (ref 22–32)
pCO2, Ven: 49.6 mmHg (ref 44–60)
pCO2, Ven: 38.6 mmHg — ABNORMAL LOW (ref 44–60)
pH, Ven: 7.338 (ref 7.25–7.43)
pH, Ven: 7.396 (ref 7.25–7.43)
pO2, Ven: 19 mmHg — CL (ref 32–45)
pO2, Ven: 28 mmHg — CL (ref 32–45)

## 2024-05-03 LAB — COMPREHENSIVE METABOLIC PANEL WITH GFR
ALT: 384 U/L — ABNORMAL HIGH (ref 0–44)
AST: 441 U/L — ABNORMAL HIGH (ref 15–41)
Albumin: 2.9 g/dL — ABNORMAL LOW (ref 3.5–5.0)
Alkaline Phosphatase: 967 U/L — ABNORMAL HIGH (ref 38–126)
Anion gap: 9 (ref 5–15)
BUN: 8 mg/dL (ref 8–23)
CO2: 24 mmol/L (ref 22–32)
Calcium: 8.6 mg/dL — ABNORMAL LOW (ref 8.9–10.3)
Chloride: 100 mmol/L (ref 98–111)
Creatinine, Ser: 0.59 mg/dL — ABNORMAL LOW (ref 0.61–1.24)
GFR, Estimated: >60 mL/min
Glucose, Bld: 122 mg/dL — ABNORMAL HIGH (ref 70–99)
Potassium: 4.5 mmol/L (ref 3.5–5.1)
Sodium: 133 mmol/L — ABNORMAL LOW (ref 135–145)
Total Bilirubin: 1.3 mg/dL — ABNORMAL HIGH (ref 0.0–1.2)
Total Protein: 5.6 g/dL — ABNORMAL LOW (ref 6.5–8.1)

## 2024-05-03 LAB — CBC WITH DIFFERENTIAL/PLATELET
Abs Immature Granulocytes: 0.05 K/uL (ref 0.00–0.07)
Basophils Absolute: 0 K/uL (ref 0.0–0.1)
Basophils Relative: 0 %
Eosinophils Absolute: 0 K/uL (ref 0.0–0.5)
Eosinophils Relative: 0 %
HCT: 35.1 % — ABNORMAL LOW (ref 39.0–52.0)
Hemoglobin: 10.4 g/dL — ABNORMAL LOW (ref 13.0–17.0)
Immature Granulocytes: 1 %
Lymphocytes Relative: 10 %
Lymphs Abs: 1.1 K/uL (ref 0.7–4.0)
MCH: 20.4 pg — ABNORMAL LOW (ref 26.0–34.0)
MCHC: 29.6 g/dL — ABNORMAL LOW (ref 30.0–36.0)
MCV: 68.8 fL — ABNORMAL LOW (ref 80.0–100.0)
Monocytes Absolute: 0.8 K/uL (ref 0.1–1.0)
Monocytes Relative: 8 %
Neutro Abs: 8.4 K/uL — ABNORMAL HIGH (ref 1.7–7.7)
Neutrophils Relative %: 81 %
Platelets: 565 K/uL — ABNORMAL HIGH (ref 150–400)
RBC: 5.1 MIL/uL (ref 4.22–5.81)
RDW: 25 % — ABNORMAL HIGH (ref 11.5–15.5)
Smear Review: NORMAL
WBC: 10.3 K/uL (ref 4.0–10.5)
nRBC: 0 % (ref 0.0–0.2)

## 2024-05-03 LAB — POCT ACTIVATED CLOTTING TIME
Activated Clotting Time: 220 s
Activated Clotting Time: 250 s
Activated Clotting Time: 271 s
Activated Clotting Time: 266 s

## 2024-05-03 LAB — LACTIC ACID, PLASMA: Lactic Acid, Venous: 4.4 mmol/L (ref 0.5–1.9)

## 2024-05-03 LAB — COOXEMETRY PANEL
Carboxyhemoglobin: 2.1 % — ABNORMAL HIGH (ref 0.5–1.5)
Methemoglobin: <0.7 % (ref 0.0–1.5)
O2 Saturation: 55.2 %
Total hemoglobin: 10.4 g/dL — ABNORMAL LOW (ref 12.0–16.0)

## 2024-05-03 LAB — CG4 I-STAT (LACTIC ACID)
Lactic Acid, Venous: 3.3 mmol/L (ref 0.5–1.9)
Lactic Acid, Venous: 3 mmol/L (ref 0.5–1.9)

## 2024-05-03 LAB — MAGNESIUM: Magnesium: 2.3 mg/dL (ref 1.7–2.4)

## 2024-05-03 MED ORDER — AMIODARONE HCL 150 MG/3ML IV SOLN
INTRAVENOUS | Status: AC
  Filled 2024-05-03: qty 3

## 2024-05-03 MED ORDER — SODIUM CHLORIDE 0.9 % IV SOLN: INTRAVENOUS | Status: AC

## 2024-05-03 MED ORDER — HEPARIN SODIUM (PORCINE) 1000 UNIT/ML IJ SOLN
INTRAMUSCULAR | Status: AC
  Filled 2024-05-03: qty 10

## 2024-05-03 MED ORDER — ASPIRIN 81 MG PO CHEW
81.0000 mg | CHEWABLE_TABLET | ORAL | Status: AC
  Administered 2024-05-03: 81 mg via ORAL
  Filled 2024-05-03: qty 1

## 2024-05-03 MED ORDER — FENTANYL CITRATE (PF) 100 MCG/2ML IJ SOLN
INTRAMUSCULAR | Status: AC
  Filled 2024-05-03: qty 2

## 2024-05-03 MED ORDER — SODIUM CHLORIDE 0.9 % IV SOLN: 250.0000 mL | INTRAVENOUS | Status: AC | PRN

## 2024-05-03 MED ORDER — VERAPAMIL HCL 2.5 MG/ML IV SOLN
INTRAVENOUS | Status: AC
  Filled 2024-05-03: qty 2

## 2024-05-03 MED ORDER — NITROGLYCERIN 1 MG/10 ML FOR IR/CATH LAB
INTRA_ARTERIAL | Status: DC | PRN
  Administered 2024-05-03: 200 ug via INTRACORONARY

## 2024-05-03 MED ORDER — AMIODARONE HCL IN DEXTROSE 360-4.14 MG/200ML-% IV SOLN
30.0000 mg/h | INTRAVENOUS | Status: DC
  Administered 2024-05-04 – 2024-05-05 (×4): 30 mg/h via INTRAVENOUS
  Filled 2024-05-03 (×2): qty 200
  Filled 2024-05-03: qty 400
  Filled 2024-05-03: qty 200

## 2024-05-03 MED ORDER — AMIODARONE HCL IN DEXTROSE 360-4.14 MG/200ML-% IV SOLN
INTRAVENOUS | Status: AC
  Filled 2024-05-03: qty 200

## 2024-05-03 MED ORDER — IOHEXOL 350 MG/ML SOLN
INTRAVENOUS | Status: DC | PRN
  Administered 2024-05-03: 40 mL

## 2024-05-03 MED ORDER — VASOPRESSIN 20 UNITS/100 ML INFUSION FOR SHOCK
0.0000 [IU]/min | INTRAVENOUS | Status: DC
  Administered 2024-05-04: 0.03 [IU]/min via INTRAVENOUS
  Filled 2024-05-03: qty 100

## 2024-05-03 MED ORDER — HEPARIN (PORCINE) IN NACL 1000-0.9 UT/500ML-% IV SOLN
INTRAVENOUS | Status: DC | PRN
  Administered 2024-05-03 (×2): 500 mL

## 2024-05-03 MED ORDER — MIDAZOLAM HCL 2 MG/2ML IJ SOLN
INTRAMUSCULAR | Status: AC
  Filled 2024-05-03: qty 2

## 2024-05-03 MED ORDER — FENTANYL CITRATE (PF) 100 MCG/2ML IJ SOLN
INTRAMUSCULAR | Status: DC | PRN
  Administered 2024-05-03: 25 ug via INTRAVENOUS
  Administered 2024-05-03: 50 ug via INTRAVENOUS
  Administered 2024-05-03: 25 ug via INTRAVENOUS

## 2024-05-03 MED ORDER — AMIODARONE HCL 150 MG/3ML IV SOLN
INTRAVENOUS | Status: DC | PRN
  Administered 2024-05-03 (×2): 150 mg via INTRAVENOUS

## 2024-05-03 MED ORDER — ATROPINE SULFATE 1 MG/10ML IJ SOSY
PREFILLED_SYRINGE | INTRAMUSCULAR | Status: AC
  Filled 2024-05-03: qty 10

## 2024-05-03 MED ORDER — AMIODARONE HCL IN DEXTROSE 360-4.14 MG/200ML-% IV SOLN
INTRAVENOUS | Status: AC | PRN
  Administered 2024-05-03: 60 mg/h via INTRAVENOUS

## 2024-05-03 MED ORDER — HEPARIN SODIUM (PORCINE) 1000 UNIT/ML IJ SOLN
INTRAMUSCULAR | Status: DC | PRN
  Administered 2024-05-03: 10000 [IU] via INTRA_ARTERIAL

## 2024-05-03 MED ORDER — VASOPRESSIN 20 UNITS/100 ML INFUSION FOR SHOCK
INTRAVENOUS | Status: AC | PRN
  Administered 2024-05-03: .3 [IU]/min via INTRAVENOUS

## 2024-05-03 MED ORDER — NITROGLYCERIN 1 MG/10 ML FOR IR/CATH LAB
INTRA_ARTERIAL | Status: AC
  Filled 2024-05-03: qty 10

## 2024-05-03 MED ORDER — SIMETHICONE 80 MG PO CHEW: 80.0000 mg | CHEWABLE_TABLET | Freq: Four times a day (QID) | ORAL | Status: DC | PRN

## 2024-05-03 MED ORDER — LIDOCAINE 5 % EX PTCH
1.0000 | MEDICATED_PATCH | Freq: Once | CUTANEOUS | Status: AC
  Administered 2024-05-03: 1 via TRANSDERMAL
  Filled 2024-05-03: qty 1

## 2024-05-03 MED ORDER — NOREPINEPHRINE 4 MG/250ML-% IV SOLN
INTRAVENOUS | Status: AC
  Filled 2024-05-03: qty 250

## 2024-05-03 MED ORDER — HEPARIN (PORCINE) 25000 UT/250ML-% IV SOLN
1050.0000 [IU]/h | INTRAVENOUS | Status: DC
  Administered 2024-05-03: 700 [IU]/h via INTRAVENOUS
  Administered 2024-05-05: 900 [IU]/h via INTRAVENOUS
  Filled 2024-05-03 (×3): qty 250

## 2024-05-03 MED ORDER — VERAPAMIL HCL 2.5 MG/ML IV SOLN
INTRAVENOUS | Status: DC | PRN
  Administered 2024-05-03: 10 mL via INTRA_ARTERIAL

## 2024-05-03 MED ORDER — NOREPINEPHRINE 16 MG/250ML-% IV SOLN: 0.0000 ug/min | INTRAVENOUS | Status: DC

## 2024-05-03 MED ORDER — NOREPINEPHRINE 4 MG/250ML-% IV SOLN
0.0000 ug/min | INTRAVENOUS | Status: DC
  Administered 2024-05-03: 9 ug/min via INTRAVENOUS
  Administered 2024-05-04: 14 ug/min via INTRAVENOUS
  Administered 2024-05-04: 10 ug/min via INTRAVENOUS
  Administered 2024-05-04: 7 ug/min via INTRAVENOUS
  Filled 2024-05-03 (×4): qty 250

## 2024-05-03 MED ORDER — PRASUGREL HCL 10 MG PO TABS
10.0000 mg | ORAL_TABLET | Freq: Every day | ORAL | Status: DC
  Administered 2024-05-04 – 2024-05-07 (×4): 10 mg via ORAL
  Filled 2024-05-03 (×4): qty 1

## 2024-05-03 MED ORDER — LIDOCAINE HCL (PF) 1 % IJ SOLN
INTRAMUSCULAR | Status: AC
  Filled 2024-05-03: qty 30

## 2024-05-03 MED ORDER — SODIUM BICARBONATE 8.4 % IV SOLN
INTRAVENOUS | Status: DC | PRN
  Administered 2024-05-03: 50 meq

## 2024-05-03 MED ORDER — PRASUGREL HCL 10 MG PO TABS
ORAL_TABLET | ORAL | Status: AC
  Filled 2024-05-03: qty 6

## 2024-05-03 MED ORDER — PRASUGREL HCL 10 MG PO TABS
ORAL_TABLET | ORAL | Status: DC | PRN
  Administered 2024-05-03: 60 mg via ORAL

## 2024-05-03 MED ORDER — SODIUM CHLORIDE 0.9% FLUSH
3.0000 mL | Freq: Two times a day (BID) | INTRAVENOUS | Status: DC
  Administered 2024-05-04 (×2): 3 mL via INTRAVENOUS

## 2024-05-03 MED ORDER — IOHEXOL 350 MG/ML SOLN
INTRAVENOUS | Status: DC | PRN
  Administered 2024-05-03: 150 mL

## 2024-05-03 MED ORDER — AMIODARONE HCL IN DEXTROSE 360-4.14 MG/200ML-% IV SOLN
60.0000 mg/h | INTRAVENOUS | Status: AC
  Administered 2024-05-03: 60 mg/h via INTRAVENOUS
  Filled 2024-05-03: qty 200

## 2024-05-03 MED ORDER — NOREPINEPHRINE BITARTRATE 1 MG/ML IV SOLN
INTRAVENOUS | Status: AC | PRN
  Administered 2024-05-03: 20 ug/min via INTRAVENOUS

## 2024-05-03 MED ORDER — TIROFIBAN HCL IN NACL 5-0.9 MG/100ML-% IV SOLN
INTRAVENOUS | Status: AC | PRN
  Administered 2024-05-03: .15 ug/kg/min via INTRAVENOUS

## 2024-05-03 MED ORDER — SODIUM CHLORIDE 0.9 % IV SOLN
INTRAVENOUS | Status: AC | PRN
  Administered 2024-05-03: 20 mL/h via INTRA_ARTERIAL

## 2024-05-03 MED ORDER — LIDOCAINE HCL (PF) 1 % IJ SOLN
INTRAMUSCULAR | Status: DC | PRN
  Administered 2024-05-03: 5 mL via INTRADERMAL

## 2024-05-03 MED ORDER — SODIUM CHLORIDE 0.9% FLUSH
3.0000 mL | Freq: Two times a day (BID) | INTRAVENOUS | Status: DC
  Administered 2024-05-03 – 2024-05-07 (×8): 3 mL via INTRAVENOUS

## 2024-05-03 MED ORDER — SODIUM CHLORIDE 0.9 % IV SOLN
INTRAVENOUS | Status: AC | PRN
  Administered 2024-05-03: 10 mL/h via INTRAVENOUS

## 2024-05-03 MED ORDER — TIROFIBAN (AGGRASTAT) 50MCG/ML FOR INTRACORONARY USE
INTRACORONARY | Status: DC | PRN
  Administered 2024-05-03 (×2): 10 mL via INTRACORONARY

## 2024-05-03 MED ORDER — SODIUM CHLORIDE 0.9% FLUSH: 3.0000 mL | INTRAVENOUS | Status: DC | PRN

## 2024-05-03 MED ORDER — LIDOCAINE HCL (PF) 1 % IJ SOLN
INTRAMUSCULAR | Status: DC | PRN
  Administered 2024-05-03: 15 mL

## 2024-05-03 MED ORDER — TIROFIBAN HCL IN NACL 5-0.9 MG/100ML-% IV SOLN
0.1500 ug/kg/min | INTRAVENOUS | Status: AC
  Administered 2024-05-03 – 2024-05-04 (×2): 0.15 ug/kg/min via INTRAVENOUS
  Filled 2024-05-03 (×2): qty 100

## 2024-05-03 MED ORDER — NOREPINEPHRINE 4 MG/250ML-% IV SOLN
INTRAVENOUS | Status: AC
  Administered 2024-05-03: 25 ug/min via INTRAVENOUS
  Filled 2024-05-03: qty 250

## 2024-05-03 MED ORDER — MIDAZOLAM HCL (PF) 2 MG/2ML IJ SOLN
INTRAMUSCULAR | Status: DC | PRN
  Administered 2024-05-03: 2 mg via INTRAVENOUS
  Administered 2024-05-03: 1 mg via INTRAVENOUS

## 2024-05-03 MED ORDER — SODIUM CHLORIDE 0.9 % IV SOLN: INTRAVENOUS | Status: DC

## 2024-05-03 MED ORDER — HEPARIN SODIUM (PORCINE) 1000 UNIT/ML IJ SOLN
INTRAMUSCULAR | Status: DC | PRN
  Administered 2024-05-03: 5000 [IU] via INTRAVENOUS
  Administered 2024-05-03: 3000 [IU] via INTRAVENOUS
  Administered 2024-05-03: 2000 [IU] via INTRAVENOUS
  Administered 2024-05-03: 3000 [IU] via INTRAVENOUS

## 2024-05-03 NOTE — Plan of Care (Signed)
" °  Problem: Education: Goal: Knowledge of the prescribed therapeutic regimen will improve Outcome: Progressing   Problem: Bowel/Gastric: Goal: Gastrointestinal status for postoperative course will improve Outcome: Progressing   Problem: Cardiac: Goal: Ability to maintain an adequate cardiac output Outcome: Progressing Goal: Will show no evidence of cardiac arrhythmias Outcome: Progressing   Problem: Respiratory: Goal: Will regain and/or maintain adequate ventilation Outcome: Progressing Goal: Respiratory status will improve Outcome: Progressing   "

## 2024-05-03 NOTE — Consult Note (Signed)
 "  NAME:  Bryan Craig, MRN:  [id number], DOB:  [birth year], LOS: 2 ADMISSION DATE:  05/01/2024, CONSULTATION DATE:  05/03/24 REFERRING MD:  AHF, CHIEF COMPLAINT:  chest pain   History of Present Illness:  74 year old man with known biliary dilation and elevated LFTs presenting for OP EUS and possible ERCP 1/5 abandoned due to severe ulceration of gastric body and antrum; this area was biopsied and patient admitted for observation and IR consult.  Unfortunately yesterday developed chest pain, dizziness and diagnosed with NSTEMI.   Came to [facility] and had LHC today (assuring no GIB post biopsy) with findings of occluded occluded ramus and high grade LAD narrow, latter was stented; 2 hours later had VTACH with redo LHC showing in-stent thrombosis. This procedure complicated by recurrent runs of VT that have since ablated; IABP placed and patient admitted to [facility] on multiple pressors.  As patient was on TRH service he is now on PCCM service.  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  Pertinent  Medical History  CAD HLD HTN Retinal detachment  Significant Hospital Events: Including procedures, antibiotic start and stop dates in addition to other pertinent events     Interim History / Subjective:  Consult  Objective    Blood pressure (!) 132/91, pulse 89, temperature 98.4 F (36.9 C), temperature source Oral, resp. rate 14, height 5' 7 (1.702 m), weight 68.4 kg, SpO2 90%.        Intake/Output Summary (Last 24 hours) at 05/03/2024 1810 Last data filed at 05/03/2024 1055 Gross per 24 hour  Intake 1670 ml  Output 4795 ml  Net -3125 ml   Filed Weights   05/01/24 0659 05/02/24 1121  Weight: 66.7 kg 68.4 kg    Examination: General: actually looks pretty good all things considered HENT: MMM, trachea midline Lungs: clear, no wheezing, nonlabored breathing Cardiovascular: regular, IABP heard Abdomen: soft, RUQ drain with bilious output Extremities: no edema,  cool to touch Neuro: moves ext x 4 GU: foley yellow urine IABP and swan sites look okay  Resolved problem list   Assessment and Plan   NSTEMI s/p LAD stenting early afternoon 1/7 c/b in-stent thrombosis manifesting as VT storm later afternoon 1/7 Post cath cardioplegia/ cardiogenic shock New diagnosis of 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- s/p IR guided biliary drain before LHC above, onc consulted 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  - ICU transfer - Levo/vaso for MAP 65 - DAPT, 1:1 IABP +/- inotropes per AHF - Airway watch and usual post cath site checks - Trend LFTs, CBC  Labs   CBC: Recent Labs  Lab 05/01/24 0922 05/02/24 0020 05/02/24 0438 05/03/24 0745  WBC 6.5 4.8 6.0 10.3  NEUTROABS  --   --   --  8.4*  HGB 8.6* 9.3* 8.7* 10.4*  HCT 28.6* 31.5* 29.3* 35.1*  MCV 67.0* 67.0* 67.0* 68.8*  PLT 449* 567* 479* 565*    Basic Metabolic Panel: Recent Labs  Lab 05/01/24 0922 05/02/24 0020 05/02/24 0438 05/03/24 0745  NA 136 136 135 133*  K 4.3 4.1 4.1 4.5  CL 103 103 102 100  CO2 26 24 25 24   GLUCOSE 129* 140* 137* 122*  BUN 11 13 12 8   CREATININE 0.64 0.70 0.62 0.59*  CALCIUM  8.0* 8.8* 8.2* 8.6*  MG  --  2.2  --  2.3   GFR: Estimated Creatinine Clearance: 75.7 mL/min (A) (by C-G formula based on SCr of 0.59 mg/dL (L)).  Recent Labs  Lab 05/01/24 0922 05/02/24 0020 05/02/24 0438 05/03/24 0745  WBC 6.5 4.8 6.0 10.3    Liver Function Tests: Recent Labs  Lab 05/01/24 0922 05/02/24 0438 05/03/24 0745  AST 248* 187* 441*  ALT 339* 311* 384*  ALKPHOS 1,114* 1,039* 967*  BILITOT 2.0* 1.3* 1.3*  PROT 5.0* 5.3* 5.6*  ALBUMIN 2.8* 2.9* 2.9*   No results for input(s): LIPASE, AMYLASE in the last 168 hours. No results for input(s): AMMONIA in the last 168 hours.  ABG No results found for: PHART, PCO2ART, PO2ART, HCO3, TCO2, ACIDBASEDEF, O2SAT   Coagulation  Profile: Recent Labs  Lab 05/01/24 0922  INR 1.0    Cardiac Enzymes: No results for input(s): CKTOTAL, CKMB, CKMBINDEX, TROPONINI in the last 168 hours.  HbA1C: Hgb A1c MFr Bld  Date/Time Value Ref Range Status  03/09/2024 12:01 PM 6.1 4.6 - 6.5 % Final    Comment:    Glycemic Control Guidelines for People with Diabetes:Non Diabetic:  <6%Goal of Therapy: <7%Additional Action Suggested:  >8%   01/24/2023 02:35 PM 6.1 (H) 4.8 - 5.6 % Final    Comment:    (NOTE) Pre diabetes:          5.7%-6.4%  Diabetes:              >6.4%  Glycemic control for   <7.0% adults with diabetes     CBG: Recent Labs  Lab 05/01/24 2249  GLUCAP 143*    Review of Systems:    Positive Symptoms in bold:  Constitutional fevers, chills, weight loss, fatigue, anorexia, malaise  Eyes decreased vision, double vision, eye irritation  Ears, Nose, Mouth, Throat sore throat, trouble swallowing, sinus congestion  Cardiovascular chest pain, paroxysmal nocturnal dyspnea, lower ext edema, palpitations   Respiratory SOB, cough, DOE, hemoptysis, wheezing  Gastrointestinal nausea, vomiting, diarrhea  Genitourinary burning with urination, trouble urinating  Musculoskeletal joint aches, joint swelling, back pain  Integumentary  rashes, skin lesions  Neurological focal weakness, focal numbness, trouble speaking, headaches  Psychiatric depression, anxiety, confusion  Endocrine polyuria, polydipsia, cold intolerance, heat intolerance  Hematologic abnormal bruising, abnormal bleeding, unexplained nose bleeds  Allergic/Immunologic recurrent infections, hives, swollen lymph nodes     Past Medical History:  He,  has a past medical history of Anemia, CAD (coronary artery disease), Hepatitis, History of kidney stones, Hyperlipidemia, Hypertension, Myocardial infarction (HCC), and Retinal detachment.   Surgical History:   Past Surgical History:  Procedure Laterality Date   BIOPSY OF SKIN SUBCUTANEOUS  TISSUE AND/OR MUCOUS MEMBRANE  05/01/2024   Procedure: BIOPSY, SKIN, SUBCUTANEOUS TISSUE, OR MUCOUS MEMBRANE;  Surgeon: [name], [name], MD;  Location: [facility];  Service: Gastroenterology;;   CORONARY STENT INTERVENTION N/A 07/22/2022   Procedure: CORONARY STENT INTERVENTION;  Surgeon: Verlin Lonni BIRCH, MD;  Location: [facility];  Service: Cardiovascular;  Laterality: N/A;   CORONARY/GRAFT ACUTE MI REVASCULARIZATION N/A 01/24/2023   Procedure: Coronary/Graft Acute MI Revascularization;  Surgeon: Verlin Lonni BIRCH, MD;  Location: [facility];  Service: Cardiovascular;  Laterality: N/A;   ESOPHAGOGASTRODUODENOSCOPY N/A 05/01/2024   Procedure: EGD (ESOPHAGOGASTRODUODENOSCOPY);  Surgeon: Wilhelmenia [name], MD;  Location: [facility];  Service: Gastroenterology;  Laterality: N/A;   EUS N/A 05/01/2024   Procedure: ULTRASOUND, UPPER GI TRACT, ENDOSCOPIC;  Surgeon: Wilhelmenia [name], MD;  Location: [facility];  Service: Gastroenterology;  Laterality: N/A;   EYE SURGERY     IR BILIARY DRAIN PLACEMENT WITH CHOLANGIOGRAM  05/02/2024   LEFT HEART CATH AND CORONARY  ANGIOGRAPHY N/A 07/22/2022   Procedure: LEFT HEART CATH AND CORONARY ANGIOGRAPHY;  Surgeon: Verlin Lonni BIRCH, MD;  Location: [facility];  Service: Cardiovascular;  Laterality: N/A;   LEFT HEART CATH AND CORONARY ANGIOGRAPHY N/A 01/24/2023   Procedure: LEFT HEART CATH AND CORONARY ANGIOGRAPHY;  Surgeon: Verlin Lonni BIRCH, MD;  Location: [facility];  Service: Cardiovascular;  Laterality: N/A;     Social History:   reports that he has never smoked. He has never used smokeless tobacco. He reports that he does not currently use alcohol. He reports that he does not use drugs.   Family History:  His family history includes Heart attack (age of onset: 52) in his father; Heart disease in his mother; Hyperlipidemia in his mother; Hypertension in his mother.   Allergies Allergies[1]    Home Medications  Prior to Admission medications  Medication Sig Start Date End Date Taking? Authorizing Provider  aspirin  EC 81 MG tablet Take 1 tablet (81 mg total) by mouth daily. Swallow whole. 07/23/22  Yes Gonfa, Taye T, MD  atorvastatin  (LIPITOR) 80 MG tablet Take 1 tablet (80 mg total) by mouth at bedtime. 02/23/24  Yes Saguier, [place], PA-C  ezetimibe  (ZETIA ) 10 MG tablet TAKE 1 TABLET BY MOUTH EVERY DAY 02/22/24  Yes Court Dorn PARAS, MD  Iron , Ferrous Sulfate , 325 (65 Fe) MG TABS Take 1 tablet by mouth 3 (three) times daily. Patient taking differently: Take 1 tablet by mouth in the morning and at bedtime. 03/31/24  Yes Saguier, [place], PA-C  metoprolol  succinate (TOPROL -XL) 50 MG 24 hr tablet TAKE 1 TABLET (50 MG TOTAL) BY MOUTH DAILY. NEEDS APPT 11/23/23  Yes Saguier, [place], PA-C  ondansetron  (ZOFRAN ) 4 MG tablet Take 1 tablet (4 mg total) by mouth every 8 (eight) hours as needed for nausea or vomiting. 03/30/24  Yes Saguier, [place], PA-C  prasugrel  (EFFIENT ) 10 MG TABS tablet Take 1 tablet (10 mg total) by mouth daily. 01/11/24  Yes Court Dorn PARAS, MD  prednisoLONE acetate (PRED FORTE) 1 % ophthalmic suspension Place 1 drop into the left eye daily.   Yes [provider]  SKYRIZI PEN 150 MG/ML pen Inject into the skin every 3 (three) months. 1 dose every 3 months 02/24/24  Yes [provider]  telmisartan  (MICARDIS ) 40 MG tablet TAKE 1 TABLET BY MOUTH EVERY DAY 04/04/24  Yes Saguier, [place], PA-C  triamcinolone  cream (KENALOG ) 0.1 % Apply 1 Application topically 2 (two) times daily. Patient taking differently: Apply 1 Application topically daily as needed (irritation). 11/11/21  Yes Saguier, [place], PA-C  sodium polystyrene (KAYEXALATE ) 15 GM/60ML suspension Take 60 ml by mouth daily for 4 days Patient not taking: Reported on 05/01/2024 03/11/24   Dorina [place], PA-C     Critical care time: 32 mins independent of procedures              [1]   Allergies Allergen Reactions   Lisinopril Cough   Nitroglycerin  Other (See Comments)    Blood pressure drop   Penicillins Rash   "

## 2024-05-03 NOTE — Progress Notes (Signed)
 " PROGRESS NOTE  Bryan Craig FMW:[id number] DOB: 1951/01/08   PCP: Dorina Loving, PA-C  Patient is from: Home.  Lives with his family.  DOA: 05/01/2024 LOS: 2  Chief complaints No chief complaint on file.    Brief Narrative / Interim history: 74 year old M with PMH of CAD on Effient , ICM with EF of 44%, HTN, HLD, and prediabetes admitted to the hospital for evaluation and management of biliary dilation.  He was initially referred to [facility] in 02/2024 for elevated LFT and new biliary and pancreatic duct dilation. 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  He was brought to [facility] on 1/5 for elective EUS and ERCP.[facility]  GI, Dr. 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  Biopsies were taken, but hospitalist admission was requested along with IR consultation for PTBD and further management.   Patient developed chest pain and dizziness on 1/6.  Troponin trended from 28-1500.  EKG showed ST elevation in lead I and aVL ST depression in V2 through V4.  Cardiology was consulted patient was transferred to the ICU.  Gastric biopsy came back positive for adenocarcinoma.  Oncology consulted.    Subjective: Seen and examined earlier this morning.  No major events overnight or this morning.  I met family including wife and 2 daughters in patient's room earlier this morning. Patient was transferred to [facility] for cardiac catheterization early in the morning and I was unable to reevaluate patient physical.   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 - Oncology on board.  Biliary obstruction/elevated LFT-likely due to the above. -Unsuccessful EUS and ERCP by GI due to the above. -S/p percutaneous transhepatic biliary tube placement and antegrade cholangiogram on 1/6. -LFT trended up.  Continue  monitoring. -Management per IR.  Non-STEMI/history of CAD s/p PCI in 06/2022-patient developed chest pain and dizziness on 1/6.  Troponin trended from 28-1500.  EKG without acute ischemic changes.  TTE with LVEF of 40 to 45%, RWMA and G1 DD. - Plan for cardiac catheterization today - Cardiology managing.  Ischemic cardiomyopathy: TTE as above. - Management per cardiology.  Gastritis/H. Pylori -GI, Dr. Wilhelmenia recommended Pylera for 14 days if no contraindication. If we cannot get Pylera inhouse, then Bismuth 525 mg QID + PPI 40 mg BID + Tetracycline 500 mg QID + Flagyl 500 mg QID.    Essential hypertension: Normotensive for most part. - Continue Avapro ,  Hyperlipidemia -Hold statin in the setting of elevated LFT  Body mass index is 23.62 kg/m.           DVT prophylaxis:  SCDs Start: 05/01/24 1016  Code Status: Full code Family Communication: Updated patient's wife and 2 daughters in patient's room.  Patient is at most comfortable [facility]. Level of care: Telemetry Status is: Inpatient Remains inpatient appropriate because: New diagnosis of gastric adenocarcinoma, non-STEMI   Final disposition: To be determined   35 minutes with more than 50% spent in reviewing records, counseling patient/family and coordinating care.  Consultants:  Gastroenterology Cardiology Oncology   Procedures: 1/5-EGD with unsuccessful EUS and ERCP 1/6-percutaneous transhepatic biliary tube placement 1/7-left heart catheterization  Microbiology summarized: 1/6-biliary culture pending.  Objective: Vitals:   05/03/24 1605 05/03/24 1610 05/03/24 1615 05/03/24 1620  BP: 116/87 (!) 117/94 102/81 (!) 132/91  Pulse: 87 96 (!) 101 89  Resp: 20 20 19  14  Temp:      TempSrc:      SpO2: 99% 100% 100% 100%  Weight:      Height:        Examination:  Patient at [facility] the whole day for heart catheterization.  Sch Meds:  Scheduled Meds:  aspirin  EC  81 mg Oral Daily   atropine         Chlorhexidine  Gluconate Cloth  6 each Topical Daily   irbesartan   150 mg Oral Daily   lidocaine -EPINEPHrine   20 mL Intradermal Once   metoprolol  succinate  50 mg Oral Daily   nitroGLYCERIN        pantoprazole  (PROTONIX ) IV  40 mg Intravenous Daily   [START ON 05/04/2024] prasugrel   10 mg Oral Daily   sodium chloride  flush  5 mL Intracatheter Q8H   Continuous Infusions: PRN Meds:.albuterol , atropine , HYDROmorphone  (DILAUDID ) injection, iohexol , nitroGLYCERIN , ondansetron  **OR** ondansetron  (ZOFRAN ) IV, mouth rinse, oxyCODONE , traZODone   Antimicrobials: Anti-infectives (From admission, onward)    Start     Dose/Rate Route Frequency Ordered Stop   05/02/24 1530  cefTRIAXone  (ROCEPHIN ) 2 g in sodium chloride  0.9 % 100 mL IVPB        2 g 200 mL/hr over 30 Minutes Intravenous  Once 05/02/24 1517 05/02/24 1557        I have personally reviewed the following labs and images: CBC: Recent Labs  Lab 05/01/24 0922 05/02/24 0020 05/02/24 0438 05/03/24 0745  WBC 6.5 4.8 6.0 10.3  NEUTROABS  --   --   --  8.4*  HGB 8.6* 9.3* 8.7* 10.4*  HCT 28.6* 31.5* 29.3* 35.1*  MCV 67.0* 67.0* 67.0* 68.8*  PLT 449* 567* 479* 565*   BMP &GFR Recent Labs  Lab 05/01/24 0922 05/02/24 0020 05/02/24 0438 05/03/24 0745  NA 136 136 135 133*  K 4.3 4.1 4.1 4.5  CL 103 103 102 100  CO2 26 24 25 24   GLUCOSE 129* 140* 137* 122*  BUN 11 13 12 8   CREATININE 0.64 0.70 0.62 0.59*  CALCIUM  8.0* 8.8* 8.2* 8.6*  MG  --  2.2  --  2.3   Estimated Creatinine Clearance: 75.7 mL/min (A) (by C-G formula based on SCr of 0.59 mg/dL (L)). Liver & Pancreas: Recent Labs  Lab 05/01/24 0922 05/02/24 0438 05/03/24 0745  AST 248* 187* 441*  ALT 339* 311* 384*  ALKPHOS 1,114* 1,039* 967*  BILITOT 2.0* 1.3* 1.3*  PROT 5.0* 5.3* 5.6*  ALBUMIN 2.8* 2.9* 2.9*   No results for input(s): LIPASE, AMYLASE in the last 168 hours. No results for input(s): AMMONIA in the last 168 hours. Diabetic: No results for  input(s): HGBA1C in the last 72 hours. Recent Labs  Lab 05/01/24 2249  GLUCAP 143*   Cardiac Enzymes: No results for input(s): CKTOTAL, CKMB, CKMBINDEX, TROPONINI in the last 168 hours. No results for input(s): PROBNP in the last 8760 hours. Coagulation Profile: Recent Labs  Lab 05/01/24 0922  INR 1.0   Thyroid  Function Tests: No results for input(s): TSH, T4TOTAL, FREET4, T3FREE, THYROIDAB in the last 72 hours. Lipid Profile: No results for input(s): CHOL, HDL, LDLCALC, TRIG, CHOLHDL, LDLDIRECT in the last 72 hours. Anemia Panel: No results for input(s): VITAMINB12, FOLATE, FERRITIN, TIBC, IRON , RETICCTPCT in the last 72 hours. Urine analysis:    Component Value Date/Time   COLORURINE YELLOW 08/03/2022 2305   APPEARANCEUR CLEAR 08/03/2022 2305   LABSPEC >=1.030 08/03/2022 2305   PHURINE 5.5 08/03/2022 2305   GLUCOSEU NEGATIVE 08/03/2022 2305   HGBUR SMALL (  A) 08/03/2022 2305   BILIRUBINUR NEGATIVE 08/03/2022 2305   KETONESUR NEGATIVE 08/03/2022 2305   PROTEINUR NEGATIVE 08/03/2022 2305   UROBILINOGEN 0.2 04/06/2014 0410   NITRITE NEGATIVE 08/03/2022 2305   LEUKOCYTESUR NEGATIVE 08/03/2022 2305   Sepsis Labs: Invalid input(s): PROCALCITONIN, LACTICIDVEN  Microbiology: Recent Results (from the past 240 hours)  MRSA Next Gen by PCR, Nasal     Status: None   Collection Time: 05/02/24 11:35 AM   Specimen: Nasal Mucosa; Nasal Swab  Result Value Ref Range Status   MRSA by PCR Next Gen NOT DETECTED NOT DETECTED Final    Comment: (NOTE) The GeneXpert MRSA Assay (FDA approved for NASAL specimens only), is one component of a comprehensive MRSA colonization surveillance program. It is not intended to diagnose MRSA infection nor to guide or monitor treatment for MRSA infections. Test performance is not FDA approved in patients less than 67 years old. Performed at [facility], [address], [place], [place] [address]   Aerobic/Anaerobic Culture w Gram Stain (surgical/deep wound)     Status: None (Preliminary result)   Collection Time: 05/02/24  6:01 PM   Specimen: BILE  Result Value Ref Range Status   Specimen Description   Final    BILE Performed at [facility], [address] [place], [place] [address]    Special Requests   Final    NONE Performed at [facility], [address] [place], [place] [address]    Gram Stain NO WBC SEEN RARE GRAM POSITIVE RODS   Final   Culture   Final    TOO YOUNG TO READ Performed at [facility], [address] [place], [place] [address]    Report Status PENDING  Incomplete    Radiology Studies: IR BILIARY DRAIN PLACEMENT WITH CHOLANGIOGRAM Result Date: 05/03/2024 INDICATION: Aborted ERCP. Briefly, 73 year old male with distal CBD obstruction, suspected malignant. 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 EXAM: Procedures; 1. PERCUTANEOUS TRANSHEPATIC CHOLANGIOGRAM 2. ULTRASOUND AND FLUOROSCOPIC GUIDED BILIARY DRAINAGE TUBE PLACEMENT COMPARISON:  CT AP, 05/02/2023.  MRCP, 03/28/2024. MEDICATIONS: Rocephin  2 gm IV; The antibiotic was administered with an appropriate time frame prior to the initiation of the procedure CONTRAST:  20 mL Omnipaque  300 - administered into the biliary tree. ANESTHESIA/SEDATION: Moderate (conscious) sedation was employed during this procedure. A total of Versed  1 mg and Fentanyl  50 mcg was administered intravenously. Moderate Sedation Time: 21 minutes. The patient's level of consciousness and vital signs were monitored continuously by radiology nursing throughout the procedure under my direct supervision. FLUOROSCOPY: Radiation Exposure Index and estimated peak skin dose (PSD); Reference air kerma (RAK), 8 mGy. COMPLICATIONS: None immediate. TECHNIQUE: Informed written consent was obtained from the patient and/or patient's representative after a discussion of  the risks, benefits and alternatives to treatment. Questions regarding the procedure were encouraged and answered. A timeout was performed prior to the initiation of the procedure. The RIGHT upper abdominal quadrant was prepped and draped in the usual sterile fashion, and a sterile drape was applied covering the operative field. Maximum barrier sterile technique with sterile gowns and gloves were used for the procedure. A timeout was performed prior to the initiation of the procedure. Ultrasound scanning of the right upper abdominal quadrant was performed to delineate the anatomy and avoid transgression of the gallbladder or the pleura. After the overlying soft tissues were anesthetized with 1% Lidocaine  with epinephrine , under direct ultrasound guidance, a 22 gauge Chiba needle was utilized to cannulate a  peripheral aspect of an intrahepatic biliary duct. A RIGHT-sided approach distal to the biliary hilum was targeted. Appropriate position was confirmed with limited contrast injection. Next, the duct was cannulated with a Nitrex wire and dilated with an Accustick set under fluoroscopic guidance. Limited cholangiograms were performed in various obliquities confirming appropriate access. Next, a 4 Fr angled glide catheter was advanced through the outer sheath of the Accustick set and with the use of a stiff Glidewire, advanced through the biliary hilum, common bile duct and ampulla to the level of the duodenum. Contrast injection confirmed appropriate positioning. Under intermittent fluoroscopic guidance and over an Amplatz wire, the track was dilated ultimately allowing placement of a 10 Fr biliary drainage catheter with coil ultimately locked within the duodenum. Contrast was injected and a completion radiographs were obtained in various obliquities. The catheter was connected to a drainage bag which yielded the brisk return of clear bile. The catheter was secured to the skin with an interrupted suture and StatLock  device. Dressings were applied. The patient tolerated the procedure well without immediate postprocedural complication. FINDINGS: *Sonographic evaluation of the liver demonstrates intrahepatic biliary ductal dilatation as was as gallbladder distention. *Under direct ultrasound guidance, a dilated duct distal to the hilum from a RIGHT-sided approach was accessed *Placement of a 10 Fr biliary drainage catheter with end coiled and locked within the duodenum and radiopaque marker located proximal to the level of the biliary hilum. *Limited contrast injection demonstrates dilatation of the biliary tree, with obstruction at the distal the CBD. *30 mL of bile was submitted for microbiological analysis. IMPRESSION: 1. Successful placement of a 10 Fr percutaneous biliary drainage catheter via a RIGHT transhepatic approach, with end coiled and locked within the duodenum. 2. Distal common bile duct obstruction, with shouldering suspicious for malignancy. Bile submitted for microbiological analysis. RECOMMENDATIONS: The patient will return to V[facility] Inter[facility]iology (VIR) for routine drainage catheter evaluation and exchange in 8 weeks. Thom Hall, MD [facility] Radiology Electronically Signed   By: Thom Hall M.D.   On: 05/03/2024 10:34      Traevon Meiring T. Rinnah Peppel [facility]  If 7PM-7AM, please contact night-coverage [email or web address] 05/03/2024, 5:07 PM   "

## 2024-05-03 NOTE — Interval H&P Note (Signed)
 History and Physical Interval Note:  05/03/2024 3:02 PM  Bryan Craig  has presented today for surgery, with the diagnosis of nstemi.  The various methods of treatment have been discussed with the patient and family. After consideration of risks, benefits and other options for treatment, the patient has consented to  Procedures: LEFT HEART CATH AND CORONARY ANGIOGRAPHY (N/A) and possible coronary angioplasty for NSTEMI as a surgical intervention.  The patient's history has been reviewed, patient examined, no change in status, stable for surgery.  I have reviewed the patient's chart and labs.  Questions were answered to the patient's satisfaction.     Gordy Bergamo

## 2024-05-03 NOTE — Progress Notes (Signed)
[facility] is here to pick up patient.  

## 2024-05-03 NOTE — Progress Notes (Signed)
 PHARMACY - ANTICOAGULATION CONSULT NOTE  Pharmacy Consult for Heparin  Infusion Indication: IABP  Allergies[1]  Patient Measurements: Height: 5' 7 (170.2 cm) Weight: 68.4 kg (150 lb 12.7 oz) IBW/kg (Calculated) : 66.1 HEPARIN  DW (KG): 68.4  Vital Signs: Temp: 98.4 F (36.9 C) (01/07 0800) Temp Source: Oral (01/07 0800) BP: 118/102 (01/07 1815) Pulse Rate: 86 (01/07 1815)  Labs: Recent Labs    05/01/24 0922 05/02/24 0020 05/02/24 0438 05/03/24 0745 05/03/24 1727 05/03/24 1751  HGB 8.6* 9.3* 8.7* 10.4* 11.6* 11.6*  HCT 28.6* 31.5* 29.3* 35.1* 34.0* 34.0*  PLT 449* 567* 479* 565*  --   --   LABPROT 13.8  --   --   --   --   --   INR 1.0  --   --   --   --   --   CREATININE 0.64 0.70 0.62 0.59*  --   --     Estimated Creatinine Clearance: 75.7 mL/min (A) (by C-G formula based on SCr of 0.59 mg/dL (L)).   Medical History: Past Medical History:  Diagnosis Date   Anemia    CAD (coronary artery disease)    Hepatitis    as a child   History of kidney stones    Hyperlipidemia    Hypertension    Myocardial infarction [facility])    Retinal detachment     Medications:  Infusions:   amiodarone      [START ON 05/04/2024] amiodarone      tirofiban      tirofiban  0.15 mcg/kg/min (05/03/24 1730)    Assessment: 73 YOM IABP placed. Heparin  consult ordered. Patient received 10,000 units heparin  in procedure.  Goal of Therapy:  Heparin  level 0.2-0.5 units/ml Monitor platelets by anticoagulation protocol: Yes   Plan:  Start heparin  infusion at 700 units/hr Check anti-Xa level in 8 hours and daily while on heparin  Continue to monitor H&H and platelets  Emory Gallentine M Davon Folta 05/03/2024,6:45 PM      [1]  Allergies Allergen Reactions   Lisinopril Cough   Nitroglycerin  Other (See Comments)    Blood pressure drop   Penicillins Rash

## 2024-05-03 NOTE — Progress Notes (Addendum)
 "   Referring Physician(s): Dr. Aloha Finner  Supervising Physician: Luverne Aran  Patient Status:  [facility] - In-pt  Chief Complaint: Biliary duct obstruction, recent gastric biopsy with poorly differentiated adenocarcinoma; referred for percutaneous transhepatic cholangiogram with biliary drain placement on 05/02/2024.   Subjective: Patient awake and alert with daughter at bedside. He is not complaining of any abdominal pain or pain at site of biliary drain. Awaiting transfer to [facility] for cardiac cath. Patient asking questions in regard to pathology results.   Allergies: Lisinopril, Nitroglycerin , and Penicillins  Medications: Prior to Admission medications  Medication Sig Start Date End Date Taking? Authorizing Provider  aspirin  EC 81 MG tablet Take 1 tablet (81 mg total) by mouth daily. Swallow whole. 07/23/22  Yes Gonfa, Taye T, MD  atorvastatin  (LIPITOR) 80 MG tablet Take 1 tablet (80 mg total) by mouth at bedtime. 02/23/24  Yes Saguier, [place], PA-C  ezetimibe  (ZETIA ) 10 MG tablet TAKE 1 TABLET BY MOUTH EVERY DAY 02/22/24  Yes Court Dorn PARAS, MD  Iron , Ferrous Sulfate , 325 (65 Fe) MG TABS Take 1 tablet by mouth 3 (three) times daily. Patient taking differently: Take 1 tablet by mouth in the morning and at bedtime. 03/31/24  Yes Saguier, [place], PA-C  metoprolol  succinate (TOPROL -XL) 50 MG 24 hr tablet TAKE 1 TABLET (50 MG TOTAL) BY MOUTH DAILY. NEEDS APPT 11/23/23  Yes Saguier, [place], PA-C  ondansetron  (ZOFRAN ) 4 MG tablet Take 1 tablet (4 mg total) by mouth every 8 (eight) hours as needed for nausea or vomiting. 03/30/24  Yes Saguier, [place], PA-C  prasugrel  (EFFIENT ) 10 MG TABS tablet Take 1 tablet (10 mg total) by mouth daily. 01/11/24  Yes Court Dorn PARAS, MD  prednisoLONE acetate (PRED FORTE) 1 % ophthalmic suspension Place 1 drop into the left eye daily.   Yes [provider]  SKYRIZI PEN 150 MG/ML pen Inject into the skin every 3 (three) months. 1 dose every  3 months 02/24/24  Yes [provider]  telmisartan  (MICARDIS ) 40 MG tablet TAKE 1 TABLET BY MOUTH EVERY DAY 04/04/24  Yes Saguier, [place], PA-C  triamcinolone  cream (KENALOG ) 0.1 % Apply 1 Application topically 2 (two) times daily. Patient taking differently: Apply 1 Application topically daily as needed (irritation). 11/11/21  Yes Saguier, [place], PA-C  sodium polystyrene (KAYEXALATE ) 15 GM/60ML suspension Take 60 ml by mouth daily for 4 days Patient not taking: Reported on 05/01/2024 03/11/24   Saguier, Dallas, PA-C     Vital Signs: BP 128/72 (BP Location: Right Arm)   Pulse 69   Temp 98.4 F (36.9 C) (Oral)   Resp 18   Ht 5' 7 (1.702 m)   Wt 150 lb 12.7 oz (68.4 kg)   SpO2 100%   BMI 23.62 kg/m   Physical Exam Abdominal:     General: Abdomen is flat.     Palpations: Abdomen is soft.     Tenderness: There is no abdominal tenderness.     Comments: 10 Fr biliary drain with bile throughout drain and bag located in the RUQ. No redness, swelling, or oozing around surgical site     Imaging: IR BILIARY DRAIN PLACEMENT WITH CHOLANGIOGRAM Result Date: 05/03/2024 INDICATION: Aborted ERCP. Briefly, 74 year old male with distal CBD obstruction, suspected malignant. 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 EXAM: Procedures; 1. PERCUTANEOUS TRANSHEPATIC CHOLANGIOGRAM 2. ULTRASOUND AND FLUOROSCOPIC GUIDED BILIARY DRAINAGE TUBE PLACEMENT COMPARISON:  CT AP, 05/02/2023.  MRCP, 03/28/2024. MEDICATIONS: Rocephin  2 gm IV; The antibiotic was administered with an appropriate time frame  prior to the initiation of the procedure CONTRAST:  20 mL Omnipaque  300 - administered into the biliary tree. ANESTHESIA/SEDATION: Moderate (conscious) sedation was employed during this procedure. A total of Versed  1 mg and Fentanyl  50 mcg was administered intravenously. Moderate Sedation Time: 21 minutes. The patient's level of consciousness and vital signs were monitored continuously by radiology nursing  throughout the procedure under my direct supervision. FLUOROSCOPY: Radiation Exposure Index and estimated peak skin dose (PSD); Reference air kerma (RAK), 8 mGy. COMPLICATIONS: None immediate. TECHNIQUE: Informed written consent was obtained from the patient and/or patient's representative after a discussion of the risks, benefits and alternatives to treatment. Questions regarding the procedure were encouraged and answered. A timeout was performed prior to the initiation of the procedure. The RIGHT upper abdominal quadrant was prepped and draped in the usual sterile fashion, and a sterile drape was applied covering the operative field. Maximum barrier sterile technique with sterile gowns and gloves were used for the procedure. A timeout was performed prior to the initiation of the procedure. Ultrasound scanning of the right upper abdominal quadrant was performed to delineate the anatomy and avoid transgression of the gallbladder or the pleura. After the overlying soft tissues were anesthetized with 1% Lidocaine  with epinephrine , under direct ultrasound guidance, a 22 gauge Chiba needle was utilized to cannulate a peripheral aspect of an intrahepatic biliary duct. A RIGHT-sided approach distal to the biliary hilum was targeted. Appropriate position was confirmed with limited contrast injection. Next, the duct was cannulated with a Nitrex wire and dilated with an Accustick set under fluoroscopic guidance. Limited cholangiograms were performed in various obliquities confirming appropriate access. Next, a 4 Fr angled glide catheter was advanced through the outer sheath of the Accustick set and with the use of a stiff Glidewire, advanced through the biliary hilum, common bile duct and ampulla to the level of the duodenum. Contrast injection confirmed appropriate positioning. Under intermittent fluoroscopic guidance and over an Amplatz wire, the track was dilated ultimately allowing placement of a 10 Fr biliary drainage  catheter with coil ultimately locked within the duodenum. Contrast was injected and a completion radiographs were obtained in various obliquities. The catheter was connected to a drainage bag which yielded the brisk return of clear bile. The catheter was secured to the skin with an interrupted suture and StatLock device. Dressings were applied. The patient tolerated the procedure well without immediate postprocedural complication. FINDINGS: *Sonographic evaluation of the liver demonstrates intrahepatic biliary ductal dilatation as was as gallbladder distention. *Under direct ultrasound guidance, a dilated duct distal to the hilum from a RIGHT-sided approach was accessed *Placement of a 10 Fr biliary drainage catheter with end coiled and locked within the duodenum and radiopaque marker located proximal to the level of the biliary hilum. *Limited contrast injection demonstrates dilatation of the biliary tree, with obstruction at the distal the CBD. *30 mL of bile was submitted for microbiological analysis. IMPRESSION: 1. Successful placement of a 10 Fr percutaneous biliary drainage catheter via a RIGHT transhepatic approach, with end coiled and locked within the duodenum. 2. Distal common bile duct obstruction, with shouldering suspicious for malignancy. Bile submitted for microbiological analysis. RECOMMENDATIONS: The patient will return to V[facility]iology (VIR) for routine drainage catheter evaluation and exchange in 8 weeks. Thom Hall, MD [facility] Electronically Signed   By: Thom Hall M.D.   On: 05/03/2024 10:34   DG CHEST PORT 1 VIEW Result Date: 05/02/2024 CLINICAL DATA:  Chest pain. Status post  upper endoscopy and endoscopic ultrasound yesterday. EXAM: PORTABLE CHEST 1 VIEW COMPARISON:  01/24/2023. FINDINGS: Normal-sized heart. Decreased inspiration with mild bibasilar linear atelectasis. Remainder of the lungs are clear. No  pneumothorax or pneumoperitoneum seen. Mild thoracolumbar spine degenerative changes. IMPRESSION: Decreased inspiration with mild bibasilar linear atelectasis. Electronically Signed   By: Elspeth Bathe M.D.   On: 05/02/2024 16:45   ECHOCARDIOGRAM COMPLETE Result Date: 05/02/2024    ECHOCARDIOGRAM REPORT   Patient Name:   Bryan Craig Date of Exam: 05/02/2024 Medical Rec #:  [id number]     Height:       67.0 in Accession #:    [id number]    Weight:       150.8 lb Date of Birth:  Jan 30, 1951     BSA:          1.794 m Patient Age:    73 years      BP:           155/129 mmHg Patient Gender: M             HR:           82 bpm. Exam Location:  Inpatient Procedure: 2D Echo, Cardiac Doppler, Color Doppler and Intracardiac            Opacification Agent (Both Spectral and Color Flow Doppler were            utilized during procedure). Indications:    NSTEMI I21.4  History:        Patient has prior history of Echocardiogram examinations, most                 recent 06/15/2023. CAD; Risk Factors:Hypertension.  Sonographer:    Gwendolynn Blush RDCS Referring Phys: [id number] BRIAN S CRENSHAW IMPRESSIONS  1. Left ventricular ejection fraction, by estimation, is 40 to 45%. The left ventricle has mildly decreased function. The left ventricle demonstrates regional wall motion abnormalities (see scoring diagram/findings for description). Left ventricular diastolic parameters are consistent with Grade I diastolic dysfunction (impaired relaxation).  2. Right ventricular systolic function is normal. The right ventricular size is normal.  3. The mitral valve is grossly normal. Mild to moderate mitral valve regurgitation. No evidence of mitral stenosis.  4. The aortic valve was not well visualized. Aortic valve regurgitation is trivial. No aortic stenosis is present. Comparison(s): No significant change from prior study. Prior images reviewed side by side. FINDINGS  Left Ventricle: Left ventricular ejection fraction, by estimation, is 40 to 45%. The  left ventricle has mildly decreased function. The left ventricle demonstrates regional wall motion abnormalities. Definity  contrast agent was given IV to delineate the left ventricular endocardial borders. The left ventricular internal cavity size was normal in size. There is no left ventricular hypertrophy. Left ventricular diastolic parameters are consistent with Grade I diastolic dysfunction (impaired relaxation).  LV Wall Scoring: The apical lateral segment, apical anterior segment, and apical inferior segment are akinetic. The posterior wall is hypokinetic. The apical septal segment is normal. Right Ventricle: The right ventricular size is normal. No increase in right ventricular wall thickness. Right ventricular systolic function is normal. Left Atrium: Left atrial size was normal in size. Right Atrium: Right atrial size was normal in size. Pericardium: There is no evidence of pericardial effusion. Mitral Valve: The mitral valve is grossly normal. Mild to moderate mitral valve regurgitation. No evidence of mitral valve stenosis. Tricuspid Valve: The tricuspid valve is normal in structure. Tricuspid valve regurgitation is not demonstrated. No evidence of tricuspid stenosis.  Aortic Valve: The aortic valve was not well visualized. Aortic valve regurgitation is trivial. No aortic stenosis is present. Pulmonic Valve: The pulmonic valve was not well visualized. Pulmonic valve regurgitation is not visualized. No evidence of pulmonic stenosis. Aorta: The aortic root and ascending aorta are structurally normal, with no evidence of dilitation. IAS/Shunts: No atrial level shunt detected by color flow Doppler.  LEFT VENTRICLE PLAX 2D LVIDd:         5.10 cm      Diastology LVIDs:         4.50 cm      LV e' medial:    6.96 cm/s LV PW:         1.00 cm      LV E/e' medial:  9.8 LV IVS:        1.20 cm      LV e' lateral:   6.64 cm/s LVOT diam:     2.00 cm      LV E/e' lateral: 10.3 LV SV:         72 LV SV Index:   40 LVOT  Area:     3.14 cm  LV Volumes (MOD) LV vol d, MOD A2C: 162.0 ml LV vol d, MOD A4C: 146.0 ml LV vol s, MOD A2C: 90.1 ml LV vol s, MOD A4C: 84.8 ml LV SV MOD A2C:     71.9 ml LV SV MOD A4C:     146.0 ml LV SV MOD BP:      64.9 ml RIGHT VENTRICLE RV S prime:     14.00 cm/s TAPSE (M-mode): 1.8 cm LEFT ATRIUM             Index        RIGHT ATRIUM           Index LA diam:        3.60 cm 2.01 cm/m   RA Area:     10.10 cm LA Vol (A2C):   46.1 ml 25.70 ml/m  RA Volume:   19.20 ml  10.71 ml/m LA Vol (A4C):   32.9 ml 18.34 ml/m LA Biplane Vol: 40.2 ml 22.41 ml/m  AORTIC VALVE LVOT Vmax:   110.00 cm/s LVOT Vmean:  68.600 cm/s LVOT VTI:    0.230 m  AORTA Ao Root diam: 3.50 cm Ao Asc diam:  3.00 cm MITRAL VALVE               TRICUSPID VALVE MV Area (PHT): 3.86 cm    TR Peak grad:   39.2 mmHg MV E velocity: 68.10 cm/s  TR Vmax:        313.00 cm/s MV A velocity: 98.10 cm/s MV E/A ratio:  0.69        SHUNTS                            Systemic VTI:  0.23 m                            Systemic Diam: 2.00 cm Stanly Leavens MD Electronically signed by Stanly Leavens MD Signature Date/Time: 05/02/2024/3:15:29 PM    Final    CT ABDOMEN PELVIS W CONTRAST Result Date: 05/01/2024 EXAM: CT ABDOMEN AND PELVIS WITH CONTRAST 05/01/2024 04:45:00 PM TECHNIQUE: CT of the abdomen and pelvis was performed with the administration of 80 mL of iohexol  (OMNIPAQUE ) 350 MG/ML injection. Multiplanar reformatted images are provided for review.  Automated exposure control, iterative reconstruction, and/or weight-based adjustment of the mA/kV was utilized to reduce the radiation dose to as low as reasonably achievable. COMPARISON: 08/03/2022, 03/28/2024 CLINICAL HISTORY: Biliary obstruction suspected (Ped 0-17y); Per discussion with IR (perform CT-Abdomen as Pancreas Protocol. FINDINGS: LOWER CHEST: No acute abnormality. LIVER: The liver is unremarkable. GALLBLADDER AND BILE DUCTS: Diffusely dilated and fluid filled gallbladder without wall  thickening or radiopaque stones. Similar severe diffuse intrahepatic and extrahepatic biliary ductal dilation with focal short segment narrowing of the mid CBD. No radiopaque choledocholithiasis. SPLEEN: No acute abnormality. 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 ADRENAL GLANDS: No acute abnormality. KIDNEYS, URETERS AND BLADDER: Small nonobstructive bilateral calyceal calculi. No hydronephrosis in either kidney. No perinephric or periureteral stranding. Urinary bladder is unremarkable. GI AND BOWEL: Moderate wall thickening throughout the stomach. Colonic diverticulosis. No changes of acute diverticulitis. Normal gas filled appendix. Right inguinal hernia containing a short segment of nondistended ileum. No associated bowel obstruction. No extravasation of enteric contrast to suggest bowel perforation. PERITONEUM AND RETROPERITONEUM: No ascites. No free air. VASCULATURE: Aorta is normal in caliber. Retroaortic left renal vein. Diffuse aortoiliac atherosclerosis. LYMPH NODES: No lymphadenopathy. REPRODUCTIVE ORGANS: Severe prostatomegaly. BONES AND SOFT TISSUES: Multilevel degenerative disc disease of the spine. No acute osseous abnormality. No focal soft tissue abnormality. IMPRESSION: 1. No acute intraabdominal or pelvic abnormality. 2. Similar severe, diffuse intrahepatic and extrahepatic biliary ductal dilation with short segment narrowing of the mid CBD. 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 Follow-up ERCP with EUS recommended for the further characterization. 3. Moderate wall thickening throughout the stomach, which may be due to underdistention or gastritis. 4.  Scattered colonic diverticulosis. No changes of acute diverticulitis. 5. Severe prostatomegaly. Electronically signed by: Rogelia Myers MD 05/01/2024 06:01 PM EST RP Workstation: [id number]    Labs:  CBC: Recent Labs    05/01/24 0922 05/02/24 0020 05/02/24 0438 05/03/24 0745  WBC 6.5 4.8 6.0 10.3  HGB 8.6* 9.3* 8.7* 10.4*  HCT 28.6* 31.5* 29.3* 35.1*  PLT 449* 567* 479* 565*    COAGS: Recent Labs    05/01/24 0922  INR 1.0    BMP: Recent Labs    05/01/24 0922 05/02/24 0020 05/02/24 0438 05/03/24 0745  NA 136 136 135 133*  K 4.3 4.1 4.1 4.5  CL 103 103 102 100  CO2 26 24 25 24   GLUCOSE 129* 140* 137* 122*  BUN 11 13 12 8   CALCIUM  8.0* 8.8* 8.2* 8.6*  CREATININE 0.64 0.70 0.62 0.59*  GFRNONAA >60 >60 >60 >60    LIVER FUNCTION TESTS: Recent Labs    04/14/24 1405 05/01/24 0922 05/02/24 0438 05/03/24 0745  BILITOT 0.9 2.0* 1.3* 1.3*  AST 327* 248* 187* 441*  ALT 418* 339* 311* 384*  ALKPHOS 1,016* 1,114* 1,039* 967*  PROT 5.7* 5.0* 5.3* 5.6*  ALBUMIN 3.7 2.8* 2.9* 2.9*    Assessment and Plan: In brief, Bryan Craig is a 74 year old male with a distal CBD obstruction, concerning for malignancy. ERCP aborted due to gastric antral stricture. Patient underwent a ultrasound and fluoroscopic percutaneous transhepatic cholangiogram with a right biliary drainage tube placement on 05/02/2024. Right biliary drain with bile throughout drain and bag. Flushed with 5mL of NS with  no resistance.  Surgical site with no signs of redness, swelling, or oozing.   Plan :  - Continue to flush the biliary drainage tube with 5mL sterile NS q8hr and record drain output every shift. - Patient will follow up with IR in 6-8 weeks for tube evaluation.  Electronically Signed: Thersia LULLA Rummer, PA 05/03/2024, 10:38 AM   I spent a total of 15 Minutes at the the patient's bedside AND on the patient's hospital floor or unit, greater than 50% of which was counseling/coordinating care for right  biliary drainage tube.  "

## 2024-05-03 NOTE — H&P (View-Only) (Signed)
 "  Rounding Note   Patient Name: Bryan Craig Date of Encounter: 05/03/2024  [facility] Cardiologist: Dorn Lesches, MD   Subjective No CP or dyspnea  Scheduled Meds:  aspirin   81 mg Oral Pre-Cath   aspirin  EC  81 mg Oral Daily   Chlorhexidine  Gluconate Cloth  6 each Topical Daily   irbesartan   150 mg Oral Daily   lidocaine   1 patch Transdermal Once   lidocaine -EPINEPHrine   20 mL Intradermal Once   metoprolol  succinate  50 mg Oral Daily   pantoprazole  (PROTONIX ) IV  40 mg Intravenous Daily   sodium chloride  flush  5 mL Intracatheter Q8H   Continuous Infusions:  sodium chloride  Stopped (05/03/24 1008)   sodium chloride  75 mL/hr at 05/03/24 1007   PRN Meds: albuterol , HYDROmorphone  (DILAUDID ) injection, iohexol , ondansetron  **OR** ondansetron  (ZOFRAN ) IV, mouth rinse, oxyCODONE , traZODone    Vital Signs  Vitals:   05/03/24 0600 05/03/24 0700 05/03/24 0730 05/03/24 0800  BP: 116/62 128/79 127/76 128/72  Pulse: 72 76 69 69  Resp: (!) 21 18 14 18   Temp:    98.4 F (36.9 C)  TempSrc:    Oral  SpO2: 100% 100% 100% 100%  Weight:      Height:        Intake/Output Summary (Last 24 hours) at 05/03/2024 1017 Last data filed at 05/03/2024 0622 Gross per 24 hour  Intake 1393.36 ml  Output 4745 ml  Net -3351.64 ml      05/02/2024   11:21 AM 05/01/2024    6:59 AM 04/14/2024    1:16 PM  Last 3 Weights  Weight (lbs) 150 lb 12.7 oz 147 lb 153 lb  Weight (kg) 68.4 kg 66.679 kg 69.4 kg      Telemetry Sinus with occasional PVC and nonsustained ventricular tachycardia- Personally Reviewed   Physical Exam  GEN: No acute distress.   Neck: No JVD Cardiac: RRR Respiratory: Clear to auscultation bilaterally. GI: Soft, status post percutaneous drain MS: No edema Neuro:  Nonfocal  Psych: Normal affect   Labs  Recent Labs  Lab 05/02/24 0020 05/02/24 0438 05/02/24 1033 05/02/24 1155 05/02/24 1331  TRNPT 28* 248* 989* 1,160* 1,476*       Chemistry Recent Labs   Lab 05/01/24 0922 05/02/24 0020 05/02/24 0438 05/03/24 0745  NA 136 136 135 133*  K 4.3 4.1 4.1 4.5  CL 103 103 102 100  CO2 26 24 25 24   GLUCOSE 129* 140* 137* 122*  BUN 11 13 12 8   CREATININE 0.64 0.70 0.62 0.59*  CALCIUM  8.0* 8.8* 8.2* 8.6*  MG  --  2.2  --  2.3  PROT 5.0*  --  5.3* 5.6*  ALBUMIN 2.8*  --  2.9* 2.9*  AST 248*  --  187* 441*  ALT 339*  --  311* 384*  ALKPHOS 1,114*  --  1,039* 967*  BILITOT 2.0*  --  1.3* 1.3*  GFRNONAA >60 >60 >60 >60  ANIONGAP 7 9 7 9      Hematology Recent Labs  Lab 05/02/24 0020 05/02/24 0438 05/03/24 0745  WBC 4.8 6.0 10.3  RBC 4.70 4.37 5.10  HGB 9.3* 8.7* 10.4*  HCT 31.5* 29.3* 35.1*  MCV 67.0* 67.0* 68.8*  MCH 19.8* 19.9* 20.4*  MCHC 29.5* 29.7* 29.6*  RDW 25.1* 25.1* 25.0*  PLT 567* 479* 565*     Radiology  DG CHEST PORT 1 VIEW Result Date: 05/02/2024 CLINICAL DATA:  Chest pain. Status post upper endoscopy and endoscopic ultrasound yesterday. EXAM: PORTABLE  CHEST 1 VIEW COMPARISON:  01/24/2023. FINDINGS: Normal-sized heart. Decreased inspiration with mild bibasilar linear atelectasis. Remainder of the lungs are clear. No pneumothorax or pneumoperitoneum seen. Mild thoracolumbar spine degenerative changes. IMPRESSION: Decreased inspiration with mild bibasilar linear atelectasis. Electronically Signed   By: Elspeth Bathe M.D.   On: 05/02/2024 16:45   ECHOCARDIOGRAM COMPLETE Result Date: 05/02/2024    ECHOCARDIOGRAM REPORT   Patient Name:   Bryan Craig Date of Exam: 05/02/2024 Medical Rec #:  [id number]     Height:       67.0 in Accession #:    [id number]    Weight:       150.8 lb Date of Birth:  1950/10/29     BSA:          1.794 m Patient Age:    73 years      BP:           155/129 mmHg Patient Gender: M             HR:           82 bpm. Exam Location:  Inpatient Procedure: 2D Echo, Cardiac Doppler, Color Doppler and Intracardiac            Opacification Agent (Both Spectral and Color Flow Doppler were            utilized during  procedure). Indications:    NSTEMI I21.4  History:        Patient has prior history of Echocardiogram examinations, most                 recent 06/15/2023. CAD; Risk Factors:Hypertension.  Sonographer:    Gwendolynn Blush RDCS Referring Phys: [id number] Jillann Charette S Jailyn Leeson IMPRESSIONS  1. Left ventricular ejection fraction, by estimation, is 40 to 45%. The left ventricle has mildly decreased function. The left ventricle demonstrates regional wall motion abnormalities (see scoring diagram/findings for description). Left ventricular diastolic parameters are consistent with Grade I diastolic dysfunction (impaired relaxation).  2. Right ventricular systolic function is normal. The right ventricular size is normal.  3. The mitral valve is grossly normal. Mild to moderate mitral valve regurgitation. No evidence of mitral stenosis.  4. The aortic valve was not well visualized. Aortic valve regurgitation is trivial. No aortic stenosis is present. Comparison(s): No significant change from prior study. Prior images reviewed side by side. FINDINGS  Left Ventricle: Left ventricular ejection fraction, by estimation, is 40 to 45%. The left ventricle has mildly decreased function. The left ventricle demonstrates regional wall motion abnormalities. Definity  contrast agent was given IV to delineate the left ventricular endocardial borders. The left ventricular internal cavity size was normal in size. There is no left ventricular hypertrophy. Left ventricular diastolic parameters are consistent with Grade I diastolic dysfunction (impaired relaxation).  LV Wall Scoring: The apical lateral segment, apical anterior segment, and apical inferior segment are akinetic. The posterior wall is hypokinetic. The apical septal segment is normal. Right Ventricle: The right ventricular size is normal. No increase in right ventricular wall thickness. Right ventricular systolic function is normal. Left Atrium: Left atrial size was normal in size. Right Atrium:  Right atrial size was normal in size. Pericardium: There is no evidence of pericardial effusion. Mitral Valve: The mitral valve is grossly normal. Mild to moderate mitral valve regurgitation. No evidence of mitral valve stenosis. Tricuspid Valve: The tricuspid valve is normal in structure. Tricuspid valve regurgitation is not demonstrated. No evidence of tricuspid stenosis. Aortic Valve: The aortic valve was not well  visualized. Aortic valve regurgitation is trivial. No aortic stenosis is present. Pulmonic Valve: The pulmonic valve was not well visualized. Pulmonic valve regurgitation is not visualized. No evidence of pulmonic stenosis. Aorta: The aortic root and ascending aorta are structurally normal, with no evidence of dilitation. IAS/Shunts: No atrial level shunt detected by color flow Doppler.  LEFT VENTRICLE PLAX 2D LVIDd:         5.10 cm      Diastology LVIDs:         4.50 cm      LV e' medial:    6.96 cm/s LV PW:         1.00 cm      LV E/e' medial:  9.8 LV IVS:        1.20 cm      LV e' lateral:   6.64 cm/s LVOT diam:     2.00 cm      LV E/e' lateral: 10.3 LV SV:         72 LV SV Index:   40 LVOT Area:     3.14 cm  LV Volumes (MOD) LV vol d, MOD A2C: 162.0 ml LV vol d, MOD A4C: 146.0 ml LV vol s, MOD A2C: 90.1 ml LV vol s, MOD A4C: 84.8 ml LV SV MOD A2C:     71.9 ml LV SV MOD A4C:     146.0 ml LV SV MOD BP:      64.9 ml RIGHT VENTRICLE RV S prime:     14.00 cm/s TAPSE (M-mode): 1.8 cm LEFT ATRIUM             Index        RIGHT ATRIUM           Index LA diam:        3.60 cm 2.01 cm/m   RA Area:     10.10 cm LA Vol (A2C):   46.1 ml 25.70 ml/m  RA Volume:   19.20 ml  10.71 ml/m LA Vol (A4C):   32.9 ml 18.34 ml/m LA Biplane Vol: 40.2 ml 22.41 ml/m  AORTIC VALVE LVOT Vmax:   110.00 cm/s LVOT Vmean:  68.600 cm/s LVOT VTI:    0.230 m  AORTA Ao Root diam: 3.50 cm Ao Asc diam:  3.00 cm MITRAL VALVE               TRICUSPID VALVE MV Area (PHT): 3.86 cm    TR Peak grad:   39.2 mmHg MV E velocity: 68.10 cm/s   TR Vmax:        313.00 cm/s MV A velocity: 98.10 cm/s MV E/A ratio:  0.69        SHUNTS                            Systemic VTI:  0.23 m                            Systemic Diam: 2.00 cm Stanly Leavens MD Electronically signed by Stanly Leavens MD Signature Date/Time: 05/02/2024/3:15:29 PM    Final    CT ABDOMEN PELVIS W CONTRAST Result Date: 05/01/2024 EXAM: CT ABDOMEN AND PELVIS WITH CONTRAST 05/01/2024 04:45:00 PM TECHNIQUE: CT of the abdomen and pelvis was performed with the administration of 80 mL of iohexol  (OMNIPAQUE ) 350 MG/ML injection. Multiplanar reformatted images are provided for review. Automated exposure control, iterative reconstruction, and/or weight-based adjustment  of the mA/kV was utilized to reduce the radiation dose to as low as reasonably achievable. COMPARISON: 08/03/2022, 03/28/2024 CLINICAL HISTORY: Biliary obstruction suspected (Ped 0-17y); Per discussion with IR (perform CT-Abdomen as Pancreas Protocol. FINDINGS: LOWER CHEST: No acute abnormality. LIVER: The liver is unremarkable. GALLBLADDER AND BILE DUCTS: Diffusely dilated and fluid filled gallbladder without wall thickening or radiopaque stones. Similar severe diffuse intrahepatic and extrahepatic biliary ductal dilation with focal short segment narrowing of the mid CBD. No radiopaque choledocholithiasis. SPLEEN: No acute abnormality. 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 ADRENAL GLANDS: No acute abnormality. KIDNEYS, URETERS AND BLADDER: Small nonobstructive bilateral calyceal calculi. No hydronephrosis in either kidney. No perinephric or periureteral stranding. Urinary bladder is unremarkable. GI AND BOWEL: Moderate wall thickening throughout the stomach. Colonic diverticulosis. No changes of acute diverticulitis. Normal gas filled appendix. Right inguinal hernia containing a short segment of nondistended ileum. No associated bowel obstruction. No  extravasation of enteric contrast to suggest bowel perforation. PERITONEUM AND RETROPERITONEUM: No ascites. No free air. VASCULATURE: Aorta is normal in caliber. Retroaortic left renal vein. Diffuse aortoiliac atherosclerosis. LYMPH NODES: No lymphadenopathy. REPRODUCTIVE ORGANS: Severe prostatomegaly. BONES AND SOFT TISSUES: Multilevel degenerative disc disease of the spine. No acute osseous abnormality. No focal soft tissue abnormality. IMPRESSION: 1. No acute intraabdominal or pelvic abnormality. 2. Similar severe, diffuse intrahepatic and extrahepatic biliary ductal dilation with short segment narrowing of the mid CBD. 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 Follow-up ERCP with EUS recommended for the further characterization. 3. Moderate wall thickening throughout the stomach, which may be due to underdistention or gastritis. 4. Scattered colonic diverticulosis. No changes of acute diverticulitis. 5. Severe prostatomegaly. Electronically signed by: Rogelia Myers MD 05/01/2024 06:01 PM EST RP Workstation: [id number]     Patient Profile   74 year old male with past medical history of coronary artery disease status post PCI of his ramus, ischemic cardiomyopathy, hypertension, hyperlipidemia admitted with elevated liver functions and probable GI malignancy for evaluation of myocardial infarction.  Patient had previous PCI of his intermediate.  He was recently found to have elevated liver functions and microcytic anemia and is undergoing GI evaluation for possible pancreatic cancer. His antiplatelets were held on December 30.  Patient subsequently developed substernal chest pain 36 hours ago associated with lateral ST elevation and anterior ST  depression and ruled in.  Cardiology asked to evaluate.  Echocardiogram this admission shows ejection fraction 40 to 45%, grade 1 diastolic dysfunction, mild to moderate mitral regurgitation, trace aortic insufficiency. Patient had percutaneous transhepatic biliary tube placement yesterday.  Assessment & Plan   1 non-ST elevation myocardial infarction-as outlined in previous note patient had chest pain associated with marked ST changes 36 hours ago (symptoms similar to his infarct pain; ST elevation laterally with marked ST depression anteriorly possibly consistent with stent thrombosis).  He had discontinued his antiplatelet regimen on December 30 in anticipation of his GI evaluation/procedures.  He ruled in.  He had percutaneous transhepatic biliary tube placement yesterday without complication.  I did discuss this with Dr. Hughes.  He felt patient could be anticoagulated once tube was in place.  Therefore plan to proceed with cardiac catheterization today.  The risk and benefits including myocardial infarction, CVA and death discussed and  he agrees to proceed.  Continue aspirin  and Toprol .  Statin is on hold given elevated liver functions.     2 history of ischemic cardiomyopathy-ischemic cardiomyopathy-continue Toprol  and ARB at present dose for now.   3 elevated liver functions/possible pancreatic cancer-patient is status post percutaneous transhepatic biliary tube placement yesterday.  Pathology suggest diffuse gastric cancer.  He has not been seen by oncology as this is a new diagnosis.   4 hypertension-blood pressure controlled.  Continue present medications.  For questions or updates, please contact [facility] Please consult [email or web address] for contact info under       Signed, Redell Shallow, MD  05/03/2024, 10:17 AM    "

## 2024-05-03 NOTE — Progress Notes (Signed)
 "  Rounding Note   Patient Name: Bryan Craig Date of Encounter: 05/03/2024  [facility] Cardiologist: Dorn Lesches, MD   Subjective No CP or dyspnea  Scheduled Meds:  aspirin   81 mg Oral Pre-Cath   aspirin  EC  81 mg Oral Daily   Chlorhexidine  Gluconate Cloth  6 each Topical Daily   irbesartan   150 mg Oral Daily   lidocaine   1 patch Transdermal Once   lidocaine -EPINEPHrine   20 mL Intradermal Once   metoprolol  succinate  50 mg Oral Daily   pantoprazole  (PROTONIX ) IV  40 mg Intravenous Daily   sodium chloride  flush  5 mL Intracatheter Q8H   Continuous Infusions:  sodium chloride  Stopped (05/03/24 1008)   sodium chloride  75 mL/hr at 05/03/24 1007   PRN Meds: albuterol , HYDROmorphone  (DILAUDID ) injection, iohexol , ondansetron  **OR** ondansetron  (ZOFRAN ) IV, mouth rinse, oxyCODONE , traZODone    Vital Signs  Vitals:   05/03/24 0600 05/03/24 0700 05/03/24 0730 05/03/24 0800  BP: 116/62 128/79 127/76 128/72  Pulse: 72 76 69 69  Resp: (!) 21 18 14 18   Temp:    98.4 F (36.9 C)  TempSrc:    Oral  SpO2: 100% 100% 100% 100%  Weight:      Height:        Intake/Output Summary (Last 24 hours) at 05/03/2024 1017 Last data filed at 05/03/2024 0622 Gross per 24 hour  Intake 1393.36 ml  Output 4745 ml  Net -3351.64 ml      05/02/2024   11:21 AM 05/01/2024    6:59 AM 04/14/2024    1:16 PM  Last 3 Weights  Weight (lbs) 150 lb 12.7 oz 147 lb 153 lb  Weight (kg) 68.4 kg 66.679 kg 69.4 kg      Telemetry Sinus with occasional PVC and nonsustained ventricular tachycardia- Personally Reviewed   Physical Exam  GEN: No acute distress.   Neck: No JVD Cardiac: RRR Respiratory: Clear to auscultation bilaterally. GI: Soft, status post percutaneous drain MS: No edema Neuro:  Nonfocal  Psych: Normal affect   Labs  Recent Labs  Lab 05/02/24 0020 05/02/24 0438 05/02/24 1033 05/02/24 1155 05/02/24 1331  TRNPT 28* 248* 989* 1,160* 1,476*       Chemistry Recent Labs   Lab 05/01/24 0922 05/02/24 0020 05/02/24 0438 05/03/24 0745  NA 136 136 135 133*  K 4.3 4.1 4.1 4.5  CL 103 103 102 100  CO2 26 24 25 24   GLUCOSE 129* 140* 137* 122*  BUN 11 13 12 8   CREATININE 0.64 0.70 0.62 0.59*  CALCIUM  8.0* 8.8* 8.2* 8.6*  MG  --  2.2  --  2.3  PROT 5.0*  --  5.3* 5.6*  ALBUMIN 2.8*  --  2.9* 2.9*  AST 248*  --  187* 441*  ALT 339*  --  311* 384*  ALKPHOS 1,114*  --  1,039* 967*  BILITOT 2.0*  --  1.3* 1.3*  GFRNONAA >60 >60 >60 >60  ANIONGAP 7 9 7 9      Hematology Recent Labs  Lab 05/02/24 0020 05/02/24 0438 05/03/24 0745  WBC 4.8 6.0 10.3  RBC 4.70 4.37 5.10  HGB 9.3* 8.7* 10.4*  HCT 31.5* 29.3* 35.1*  MCV 67.0* 67.0* 68.8*  MCH 19.8* 19.9* 20.4*  MCHC 29.5* 29.7* 29.6*  RDW 25.1* 25.1* 25.0*  PLT 567* 479* 565*     Radiology  DG CHEST PORT 1 VIEW Result Date: 05/02/2024 CLINICAL DATA:  Chest pain. Status post upper endoscopy and endoscopic ultrasound yesterday. EXAM: PORTABLE  CHEST 1 VIEW COMPARISON:  01/24/2023. FINDINGS: Normal-sized heart. Decreased inspiration with mild bibasilar linear atelectasis. Remainder of the lungs are clear. No pneumothorax or pneumoperitoneum seen. Mild thoracolumbar spine degenerative changes. IMPRESSION: Decreased inspiration with mild bibasilar linear atelectasis. Electronically Signed   By: Bryan Craig M.D.   On: 05/02/2024 16:45   ECHOCARDIOGRAM COMPLETE Result Date: 05/02/2024    ECHOCARDIOGRAM REPORT   Patient Name:   Bryan Craig Date of Exam: 05/02/2024 Medical Rec #:  [id number]     Height:       67.0 in Accession #:    [id number]    Weight:       150.8 lb Date of Birth:  1950-11-10     BSA:          1.794 m Patient Age:    74 years      BP:           155/129 mmHg Patient Gender: M             HR:           82 bpm. Exam Location:  Inpatient Procedure: 2D Echo, Cardiac Doppler, Color Doppler and Intracardiac            Opacification Agent (Both Spectral and Color Flow Doppler were            utilized during  procedure). Indications:    NSTEMI I21.4  History:        Patient has prior history of Echocardiogram examinations, most                 recent 06/15/2023. CAD; Risk Factors:Hypertension.  Sonographer:    Bryan Craig RDCS Referring Phys: 24 Bryan Craig Bryan Craig IMPRESSIONS  1. Left ventricular ejection fraction, by estimation, is 40 to 45%. The left ventricle has mildly decreased function. The left ventricle demonstrates regional wall motion abnormalities (see scoring diagram/findings for description). Left ventricular diastolic parameters are consistent with Grade I diastolic dysfunction (impaired relaxation).  2. Right ventricular systolic function is normal. The right ventricular size is normal.  3. The mitral valve is grossly normal. Mild to moderate mitral valve regurgitation. No evidence of mitral stenosis.  4. The aortic valve was not well visualized. Aortic valve regurgitation is trivial. No aortic stenosis is present. Comparison(Craig): No significant change from prior study. Prior images reviewed side by side. FINDINGS  Left Ventricle: Left ventricular ejection fraction, by estimation, is 40 to 45%. The left ventricle has mildly decreased function. The left ventricle demonstrates regional wall motion abnormalities. Definity  contrast agent was given IV to delineate the left ventricular endocardial borders. The left ventricular internal cavity size was normal in size. There is no left ventricular hypertrophy. Left ventricular diastolic parameters are consistent with Grade I diastolic dysfunction (impaired relaxation).  LV Wall Scoring: The apical lateral segment, apical anterior segment, and apical inferior segment are akinetic. The posterior wall is hypokinetic. The apical septal segment is normal. Right Ventricle: The right ventricular size is normal. No increase in right ventricular wall thickness. Right ventricular systolic function is normal. Left Atrium: Left atrial size was normal in size. Right Atrium:  Right atrial size was normal in size. Pericardium: There is no evidence of pericardial effusion. Mitral Valve: The mitral valve is grossly normal. Mild to moderate mitral valve regurgitation. No evidence of mitral valve stenosis. Tricuspid Valve: The tricuspid valve is normal in structure. Tricuspid valve regurgitation is not demonstrated. No evidence of tricuspid stenosis. Aortic Valve: The aortic valve was not well  visualized. Aortic valve regurgitation is trivial. No aortic stenosis is present. Pulmonic Valve: The pulmonic valve was not well visualized. Pulmonic valve regurgitation is not visualized. No evidence of pulmonic stenosis. Aorta: The aortic root and ascending aorta are structurally normal, with no evidence of dilitation. IAS/Shunts: No atrial level shunt detected by color flow Doppler.  LEFT VENTRICLE PLAX 2D LVIDd:         5.10 cm      Diastology LVIDs:         4.50 cm      LV e' medial:    6.96 cm/Craig LV PW:         1.00 cm      LV E/e' medial:  9.8 LV IVS:        1.20 cm      LV e' lateral:   6.64 cm/Craig LVOT diam:     2.00 cm      LV E/e' lateral: 10.3 LV SV:         72 LV SV Index:   40 LVOT Area:     3.14 cm  LV Volumes (MOD) LV vol d, MOD A2C: 162.0 ml LV vol d, MOD A4C: 146.0 ml LV vol Craig, MOD A2C: 90.1 ml LV vol Craig, MOD A4C: 84.8 ml LV SV MOD A2C:     71.9 ml LV SV MOD A4C:     146.0 ml LV SV MOD BP:      64.9 ml RIGHT VENTRICLE RV Craig prime:     14.00 cm/Craig TAPSE (M-mode): 1.8 cm LEFT ATRIUM             Index        RIGHT ATRIUM           Index LA diam:        3.60 cm 2.01 cm/m   RA Area:     10.10 cm LA Vol (A2C):   46.1 ml 25.70 ml/m  RA Volume:   19.20 ml  10.71 ml/m LA Vol (A4C):   32.9 ml 18.34 ml/m LA Biplane Vol: 40.2 ml 22.41 ml/m  AORTIC VALVE LVOT Vmax:   110.00 cm/Craig LVOT Vmean:  68.600 cm/Craig LVOT VTI:    0.230 m  AORTA Ao Root diam: 3.50 cm Ao Asc diam:  3.00 cm MITRAL VALVE               TRICUSPID VALVE MV Area (PHT): 3.86 cm    TR Peak grad:   39.2 mmHg MV E velocity: 68.10 cm/Craig   TR Vmax:        313.00 cm/Craig MV A velocity: 98.10 cm/Craig MV E/A ratio:  0.69        SHUNTS                            Systemic VTI:  0.23 m                            Systemic Diam: 2.00 cm Stanly Leavens MD Electronically signed by Stanly Leavens MD Signature Date/Time: 05/02/2024/3:15:29 PM    Final    CT ABDOMEN PELVIS W CONTRAST Result Date: 05/01/2024 EXAM: CT ABDOMEN AND PELVIS WITH CONTRAST 05/01/2024 04:45:00 PM TECHNIQUE: CT of the abdomen and pelvis was performed with the administration of 80 mL of iohexol  (OMNIPAQUE ) 350 MG/ML injection. Multiplanar reformatted images are provided for review. Automated exposure control, iterative reconstruction, and/or weight-based adjustment  of the mA/kV was utilized to reduce the radiation dose to as low as reasonably achievable. COMPARISON: 08/03/2022, 03/28/2024 CLINICAL HISTORY: Biliary obstruction suspected (Ped 0-17y); Per discussion with IR (perform CT-Abdomen as Pancreas Protocol. FINDINGS: LOWER CHEST: No acute abnormality. LIVER: The liver is unremarkable. GALLBLADDER AND BILE DUCTS: Diffusely dilated and fluid filled gallbladder without wall thickening or radiopaque stones. Similar severe diffuse intrahepatic and extrahepatic biliary ductal dilation with focal short segment narrowing of the mid CBD. No radiopaque choledocholithiasis. SPLEEN: No acute abnormality. 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 ADRENAL GLANDS: No acute abnormality. KIDNEYS, URETERS AND BLADDER: Small nonobstructive bilateral calyceal calculi. No hydronephrosis in either kidney. No perinephric or periureteral stranding. Urinary bladder is unremarkable. GI AND BOWEL: Moderate wall thickening throughout the stomach. Colonic diverticulosis. No changes of acute diverticulitis. Normal gas filled appendix. Right inguinal hernia containing a short segment of nondistended ileum. No associated bowel obstruction. No  extravasation of enteric contrast to suggest bowel perforation. PERITONEUM AND RETROPERITONEUM: No ascites. No free air. VASCULATURE: Aorta is normal in caliber. Retroaortic left renal vein. Diffuse aortoiliac atherosclerosis. LYMPH NODES: No lymphadenopathy. REPRODUCTIVE ORGANS: Severe prostatomegaly. BONES AND SOFT TISSUES: Multilevel degenerative disc disease of the spine. No acute osseous abnormality. No focal soft tissue abnormality. IMPRESSION: 1. No acute intraabdominal or pelvic abnormality. 2. Similar severe, diffuse intrahepatic and extrahepatic biliary ductal dilation with short segment narrowing of the mid CBD. 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 Follow-up ERCP with EUS recommended for the further characterization. 3. Moderate wall thickening throughout the stomach, which may be due to underdistention or gastritis. 4. Scattered colonic diverticulosis. No changes of acute diverticulitis. 5. Severe prostatomegaly. Electronically signed by: Rogelia Myers MD 05/01/2024 06:01 PM EST RP Workstation: [id number]     Patient Profile   74 year old male with past medical history of coronary artery disease status post PCI of his ramus, ischemic cardiomyopathy, hypertension, hyperlipidemia admitted with elevated liver functions and probable GI malignancy for evaluation of myocardial infarction.  Patient had previous PCI of his intermediate.  He was recently found to have elevated liver functions and microcytic anemia and is undergoing GI evaluation for possible pancreatic cancer. His antiplatelets were held on December 30.  Patient subsequently developed substernal chest pain 36 hours ago associated with lateral ST elevation and anterior ST  depression and ruled in.  Cardiology asked to evaluate.  Echocardiogram this admission shows ejection fraction 40 to 45%, grade 1 diastolic dysfunction, mild to moderate mitral regurgitation, trace aortic insufficiency. Patient had percutaneous transhepatic biliary tube placement yesterday.  Assessment & Plan   1 non-ST elevation myocardial infarction-as outlined in previous note patient had chest pain associated with marked ST changes 36 hours ago (symptoms similar to his infarct pain; ST elevation laterally with marked ST depression anteriorly possibly consistent with stent thrombosis).  He had discontinued his antiplatelet regimen on December 30 in anticipation of his GI evaluation/procedures.  He ruled in.  He had percutaneous transhepatic biliary tube placement yesterday without complication.  I did discuss this with Dr. Hughes.  He felt patient could be anticoagulated once tube was in place.  Therefore plan to proceed with cardiac catheterization today.  The risk and benefits including myocardial infarction, CVA and death discussed and  he agrees to proceed.  Continue aspirin  and Toprol .  Statin is on hold given elevated liver functions.     2 history of ischemic cardiomyopathy-ischemic cardiomyopathy-continue Toprol  and ARB at present dose for now.   3 elevated liver functions/possible pancreatic cancer-patient is status post percutaneous transhepatic biliary tube placement yesterday.  Pathology suggest diffuse gastric cancer.  He has not been seen by oncology as this is a new diagnosis.   4 hypertension-blood pressure controlled.  Continue present medications.  For questions or updates, please contact [facility] Please consult [email or web address] for contact info under       Signed, Redell Shallow, MD  05/03/2024, 10:17 AM    "

## 2024-05-03 NOTE — Consult Note (Addendum)
 "   Advanced Heart Failure Team Consult Note   Primary Physician: Dorina Loving, PA-C Cardiologist:  Dorn Lesches, MD HPI:    Bryan Craig is seen today for evaluation of cardiogenic shock at the request of Dr. Ladona.   Bryan Craig is a 74 y.o. male with newly-diagnosed gastric cancer, NSTEMI, ischemic cardiomyopathy.  Patient had initial lateral STEMI in 3/24, DES to ramus.  Admission in 9/24 with lateral STEMI again, thrombotic occlusion proximal ramus stent.  Patient has been on Effient  and ASA.  Patient has had early satiety and weight loss of the last 6 months.   Elevated LFTs noted, CT abdomen showed pancreatic and biliary duct dilatation as well as gastric wall thickening.  DAPT was stopped 12/30 for EUS/EGD.  Patient had biopsy of stomach showing gastric adenocarcinoma.  He had chest pain and ST changes after EGD, was found to have NSTEMI.  Given biliary dilatation and elevated LFTs, patient had percutaneous transhepatic biliary drain placed prior to cath as he needed to be off DAPT to traverse the liver.  Echo showed EF 40-45%, mild-moderate MR.  He had cath today, showing occluded ramus at the ostium and 95% proximal LAD stenosis.  Initially, PTCA was done but the LAD then occluded.  DES was then placed with TIMI-3 flow.  Patient went back to floor but developed chest pain and was returned to the cath lab.  IABP was placed and patient had repeat cath showing thrombotic occlusion of LAD stent.  PTCA/thrombectomy was done, restoring TIMI-3 flow with resolution of chest pain.   Patient had transient VT and was started on amiodarone  with resolution.   Patient was started on NE 15 and vasopressin  0.04 in cath lab while hypotensive prior to IABP placement.   Home Medications Prior to Admission medications  Medication Sig Start Date End Date Taking? Authorizing Provider  aspirin  EC 81 MG tablet Take 1 tablet (81 mg total) by mouth daily. Swallow whole. 07/23/22  Yes Gonfa, Taye T, MD   atorvastatin  (LIPITOR) 80 MG tablet Take 1 tablet (80 mg total) by mouth at bedtime. 02/23/24  Yes Saguier, Loving, PA-C  ezetimibe  (ZETIA ) 10 MG tablet TAKE 1 TABLET BY MOUTH EVERY DAY 02/22/24  Yes Lesches Dorn PARAS, MD  Iron , Ferrous Sulfate , 325 (65 Fe) MG TABS Take 1 tablet by mouth 3 (three) times daily. Patient taking differently: Take 1 tablet by mouth in the morning and at bedtime. 03/31/24  Yes Saguier, Loving, PA-C  metoprolol  succinate (TOPROL -XL) 50 MG 24 hr tablet TAKE 1 TABLET (50 MG TOTAL) BY MOUTH DAILY. NEEDS APPT 11/23/23  Yes Saguier, Loving, PA-C  ondansetron  (ZOFRAN ) 4 MG tablet Take 1 tablet (4 mg total) by mouth every 8 (eight) hours as needed for nausea or vomiting. 03/30/24  Yes Saguier, Loving, PA-C  prasugrel  (EFFIENT ) 10 MG TABS tablet Take 1 tablet (10 mg total) by mouth daily. 01/11/24  Yes Lesches Dorn PARAS, MD  prednisoLONE acetate (PRED FORTE) 1 % ophthalmic suspension Place 1 drop into the left eye daily.   Yes [provider]  SKYRIZI PEN 150 MG/ML pen Inject into the skin every 3 (three) months. 1 dose every 3 months 02/24/24  Yes [provider]  telmisartan  (MICARDIS ) 40 MG tablet TAKE 1 TABLET BY MOUTH EVERY DAY 04/04/24  Yes Saguier, Loving, PA-C  triamcinolone  cream (KENALOG ) 0.1 % Apply 1 Application topically 2 (two) times daily. Patient taking differently: Apply 1 Application topically daily as needed (irritation). 11/11/21  Yes Saguier, Loving, PA-C  sodium polystyrene (KAYEXALATE ) 15 GM/60ML suspension Take 60 ml by mouth daily for 4 days Patient not taking: Reported on 05/01/2024 03/11/24   Saguier, [place], PA-C    Past Medical History: Past Medical History:  Diagnosis Date   Anemia    CAD (coronary artery disease)    Hepatitis    as a child   History of kidney stones    Hyperlipidemia    Hypertension    Myocardial infarction [facility])    Retinal detachment     Past Surgical History: Past Surgical History:  Procedure Laterality  Date   BIOPSY OF SKIN SUBCUTANEOUS TISSUE AND/OR MUCOUS MEMBRANE  05/01/2024   Procedure: BIOPSY, SKIN, SUBCUTANEOUS TISSUE, OR MUCOUS MEMBRANE;  Surgeon: Wilhelmenia, [name], MD;  Location: [facility];  Service: Gastroenterology;;   CORONARY STENT INTERVENTION N/A 07/22/2022   Procedure: CORONARY STENT INTERVENTION;  Surgeon: Verlin Lonni BIRCH, MD;  Location: [facility];  Service: Cardiovascular;  Laterality: N/A;   CORONARY/GRAFT ACUTE MI REVASCULARIZATION N/A 01/24/2023   Procedure: Coronary/Graft Acute MI Revascularization;  Surgeon: Verlin Lonni BIRCH, MD;  Location: [facility];  Service: Cardiovascular;  Laterality: N/A;   ESOPHAGOGASTRODUODENOSCOPY N/A 05/01/2024   Procedure: EGD (ESOPHAGOGASTRODUODENOSCOPY);  Surgeon: Wilhelmenia [name], MD;  Location: [facility];  Service: Gastroenterology;  Laterality: N/A;   EUS N/A 05/01/2024   Procedure: ULTRASOUND, UPPER GI TRACT, ENDOSCOPIC;  Surgeon: Wilhelmenia [name], MD;  Location: [facility];  Service: Gastroenterology;  Laterality: N/A;   EYE SURGERY     IR BILIARY DRAIN PLACEMENT WITH CHOLANGIOGRAM  05/02/2024   LEFT HEART CATH AND CORONARY ANGIOGRAPHY N/A 07/22/2022   Procedure: LEFT HEART CATH AND CORONARY ANGIOGRAPHY;  Surgeon: Verlin Lonni BIRCH, MD;  Location: [facility];  Service: Cardiovascular;  Laterality: N/A;   LEFT HEART CATH AND CORONARY ANGIOGRAPHY N/A 01/24/2023   Procedure: LEFT HEART CATH AND CORONARY ANGIOGRAPHY;  Surgeon: Verlin Lonni BIRCH, MD;  Location: [facility];  Service: Cardiovascular;  Laterality: N/A;    Family History: Family History  Problem Relation Age of Onset   Hyperlipidemia Mother    Hypertension Mother    Heart disease Mother    Heart attack Father 78    Social History: Social History   Socioeconomic History   Marital status: Married    Spouse name: Lonell   Number of children: 3   Years of education: Not on file   Highest  education level: Not on file  Occupational History   Occupation: retired    Comment: civil service fast streamer  Tobacco Use   Smoking status: Never   Smokeless tobacco: Never  Vaping Use   Vaping status: Never Used  Substance and Sexual Activity   Alcohol use: Not Currently   Drug use: Never   Sexual activity: Yes  Other Topics Concern   Not on file  Social History Narrative   Married, 2 daughters with current wife and 1 daughter from previous marriage   Social Drivers of Health   Tobacco Use: Low Risk (05/01/2024)   Patient History    Smoking Tobacco Use: Never    Smokeless Tobacco Use: Never    Passive Exposure: Not on file  Financial Resource Strain: Low Risk (11/30/2023)   Overall Financial Resource Strain (CARDIA)    Difficulty of Paying Living Expenses: Not hard at all  Food Insecurity: No Food Insecurity (05/01/2024)   Epic    Worried About Radiation Protection Practitioner of Food in the Last Year: Never true  Ran Out of Food in the Last Year: Never true  Transportation Needs: No Transportation Needs (05/01/2024)   Epic    Lack of Transportation (Medical): No    Lack of Transportation (Non-Medical): No  Physical Activity: Sufficiently Active (11/30/2023)   Exercise Vital Sign    Days of Exercise per Week: 7 days    Minutes of Exercise per Session: 50 min  Stress: No Stress Concern Present (11/30/2023)   [employer] of Occupational Health - Occupational Stress Questionnaire    Feeling of Stress: Not at all  Social Connections: Moderately Isolated (05/01/2024)   Social Connection and Isolation Panel    Frequency of Communication with Friends and Family: More than three times a week    Frequency of Social Gatherings with Friends and Family: More than three times a week    Attends Religious Services: Never    Database Administrator or Organizations: No    Attends Banker Meetings: Never    Marital Status: Married  Depression (PHQ2-9): Low Risk (11/30/2023)   Depression  (PHQ2-9)    PHQ-2 Score: 0  Alcohol Screen: Low Risk (11/30/2023)   Alcohol Screen    Last Alcohol Screening Score (AUDIT): 2  Housing: Low Risk (05/01/2024)   Epic    Unable to Pay for Housing in the Last Year: No    Number of Times Moved in the Last Year: 0    Homeless in the Last Year: No  Utilities: Not At Risk (05/01/2024)   Epic    Threatened with loss of utilities: No  Health Literacy: Adequate Health Literacy (11/30/2023)   B1300 Health Literacy    Frequency of need for help with medical instructions: Never    Allergies:  Allergies[1]  Objective:   Vital Signs:   Temp:  [98 F (36.7 C)-98.5 F (36.9 C)] 98.4 F (36.9 C) (01/07 0800) Pulse Rate:  [69-101] 89 (01/07 1620) Resp:  [14-23] 14 (01/07 1620) BP: (82-155)/(56-97) 132/91 (01/07 1620) SpO2:  [90 %-100 %] 90 % (01/07 1731) Last BM Date : 05/01/24  Weight change: Filed Weights   05/01/24 0659 05/02/24 1121  Weight: 66.7 kg 68.4 kg    Intake/Output:   Intake/Output Summary (Last 24 hours) at 05/03/2024 1751 Last data filed at 05/03/2024 1055 Gross per 24 hour  Intake 1670 ml  Output 4795 ml  Net -3125 ml    Physical Exam   General: Frail Neck: No JVD, no thyromegaly or thyroid  nodule.  Lungs: Clear to auscultation bilaterally with normal respiratory effort. CV: Nondisplaced PMI.  Heart regular S1/S2, no S3/S4, no murmur.  No peripheral edema.  No carotid bruit.  Normal pedal pulses.  Abdomen: Soft, nontender, no hepatosplenomegaly, no distention.  Skin: Intact without lesions or rashes.  Neurologic: Alert and oriented x 3.  Psych: Normal affect. Extremities: No clubbing or cyanosis.  HEENT: Normal.   ECG   NSR with anterolateral STE (personally reviewed)   Labs   Basic Metabolic Panel: Recent Labs  Lab 05/01/24 0922 05/02/24 0020 05/02/24 0438 05/03/24 0745  NA 136 136 135 133*  K 4.3 4.1 4.1 4.5  CL 103 103 102 100  CO2 26 24 25 24   GLUCOSE 129* 140* 137* 122*  BUN 11 13 12 8    CREATININE 0.64 0.70 0.62 0.59*  CALCIUM  8.0* 8.8* 8.2* 8.6*  MG  --  2.2  --  2.3    Liver Function Tests: Recent Labs  Lab 05/01/24 0922 05/02/24 0438 05/03/24 0745  AST 248* 187*  441*  ALT 339* 311* 384*  ALKPHOS 1,114* 1,039* 967*  BILITOT 2.0* 1.3* 1.3*  PROT 5.0* 5.3* 5.6*  ALBUMIN 2.8* 2.9* 2.9*   No results for input(s): LIPASE, AMYLASE in the last 168 hours. No results for input(s): AMMONIA in the last 168 hours.  CBC: Recent Labs  Lab 05/01/24 0922 05/02/24 0020 05/02/24 0438 05/03/24 0745  WBC 6.5 4.8 6.0 10.3  NEUTROABS  --   --   --  8.4*  HGB 8.6* 9.3* 8.7* 10.4*  HCT 28.6* 31.5* 29.3* 35.1*  MCV 67.0* 67.0* 67.0* 68.8*  PLT 449* 567* 479* 565*    Cardiac Enzymes: No results for input(s): CKTOTAL, CKMB, CKMBINDEX, TROPONINI in the last 168 hours.  BNP: BNP (last 3 results) No results for input(s): BNP in the last 8760 hours.  ProBNP (last 3 results) No results for input(s): PROBNP in the last 8760 hours.   CBG: Recent Labs  Lab 05/01/24 2249  GLUCAP 143*    Coagulation Studies: Recent Labs    05/01/24 0922  LABPROT 13.8  INR 1.0     Imaging   IR BILIARY DRAIN PLACEMENT WITH CHOLANGIOGRAM Result Date: 05/03/2024 INDICATION: Aborted ERCP. Briefly, 74 year old male with distal CBD obstruction, suspected malignant. 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 EXAM: Procedures; 1. PERCUTANEOUS TRANSHEPATIC CHOLANGIOGRAM 2. ULTRASOUND AND FLUOROSCOPIC GUIDED BILIARY DRAINAGE TUBE PLACEMENT COMPARISON:  CT AP, 05/02/2023.  MRCP, 03/28/2024. MEDICATIONS: Rocephin  2 gm IV; The antibiotic was administered with an appropriate time frame prior to the initiation of the procedure CONTRAST:  20 mL Omnipaque  300 - administered into the biliary tree. ANESTHESIA/SEDATION: Moderate (conscious) sedation was employed during this procedure. A total of Versed  1 mg and Fentanyl  50 mcg was administered intravenously. Moderate Sedation  Time: 21 minutes. The patient's level of consciousness and vital signs were monitored continuously by radiology nursing throughout the procedure under my direct supervision. FLUOROSCOPY: Radiation Exposure Index and estimated peak skin dose (PSD); Reference air kerma (RAK), 8 mGy. COMPLICATIONS: None immediate. TECHNIQUE: Informed written consent was obtained from the patient and/or patient's representative after a discussion of the risks, benefits and alternatives to treatment. Questions regarding the procedure were encouraged and answered. A timeout was performed prior to the initiation of the procedure. The RIGHT upper abdominal quadrant was prepped and draped in the usual sterile fashion, and a sterile drape was applied covering the operative field. Maximum barrier sterile technique with sterile gowns and gloves were used for the procedure. A timeout was performed prior to the initiation of the procedure. Ultrasound scanning of the right upper abdominal quadrant was performed to delineate the anatomy and avoid transgression of the gallbladder or the pleura. After the overlying soft tissues were anesthetized with 1% Lidocaine  with epinephrine , under direct ultrasound guidance, a 22 gauge Chiba needle was utilized to cannulate a peripheral aspect of an intrahepatic biliary duct. A RIGHT-sided approach distal to the biliary hilum was targeted. Appropriate position was confirmed with limited contrast injection. Next, the duct was cannulated with a Nitrex wire and dilated with an Accustick set under fluoroscopic guidance. Limited cholangiograms were performed in various obliquities confirming appropriate access. Next, a 4 Fr angled glide catheter was advanced through the outer sheath of the Accustick set and with the use of a stiff Glidewire, advanced through the biliary hilum, common bile duct and ampulla to the level of the duodenum. Contrast injection confirmed appropriate positioning. Under intermittent  fluoroscopic guidance and over an Amplatz wire, the track was dilated ultimately allowing placement of  a 10 Fr biliary drainage catheter with coil ultimately locked within the duodenum. Contrast was injected and a completion radiographs were obtained in various obliquities. The catheter was connected to a drainage bag which yielded the brisk return of clear bile. The catheter was secured to the skin with an interrupted suture and StatLock device. Dressings were applied. The patient tolerated the procedure well without immediate postprocedural complication. FINDINGS: *Sonographic evaluation of the liver demonstrates intrahepatic biliary ductal dilatation as was as gallbladder distention. *Under direct ultrasound guidance, a dilated duct distal to the hilum from a RIGHT-sided approach was accessed *Placement of a 10 Fr biliary drainage catheter with end coiled and locked within the duodenum and radiopaque marker located proximal to the level of the biliary hilum. *Limited contrast injection demonstrates dilatation of the biliary tree, with obstruction at the distal the CBD. *30 mL of bile was submitted for microbiological analysis. IMPRESSION: 1. Successful placement of a 10 Fr percutaneous biliary drainage catheter via a RIGHT transhepatic approach, with end coiled and locked within the duodenum. 2. Distal common bile duct obstruction, with shouldering suspicious for malignancy. Bile submitted for microbiological analysis. RECOMMENDATIONS: The patient will return to V[facility] Inter[facility]iology (VIR) for routine drainage catheter evaluation and exchange in 8 weeks. Thom Hall, MD [facility] Electronically Signed   By: Thom Hall M.D.   On: 05/03/2024 10:34    Medications:   Current Medications:  [MAR Hold] aspirin  EC  81 mg Oral Daily   atropine        [MAR Hold] Chlorhexidine  Gluconate Cloth  6 each Topical Daily   [MAR Hold] irbesartan   150  mg Oral Daily   [MAR Hold] lidocaine -EPINEPHrine   20 mL Intradermal Once   [MAR Hold] metoprolol  succinate  50 mg Oral Daily   nitroGLYCERIN        [MAR Hold] pantoprazole  (PROTONIX ) IV  40 mg Intravenous Daily   [MAR Hold] prasugrel   10 mg Oral Daily   [MAR Hold] sodium chloride  flush  5 mL Intracatheter Q8H    Infusions:  amiodarone  60 mg/hr (05/03/24 1747)   norepinephrine  (LEVOPHED ) 4 mg in dextrose  5 % 250 mL (0.016 mg/mL) infusion 30 mcg/min (05/03/24 1734)   vasopressin  0.04 Units/hr (05/03/24 1751)    Assessment/Plan   1. CAD: Patient has history of ramus PCI and stent thrombosis.  Cath today in setting of NSTEMI showed occluded ramus and 95% LAD stenosis, treated with DES.  On floor after cath, patient had recurrent chest pain and anterolateral STE.  He was returned to cath lab, noted to have thrombotic occlusion of fresh stent, treated with PTCA/thrombectomy with restoration of TIMI3 flow.  He will be at significant risk for future thrombotic events in setting of gastric cancer.  - Aggrastat  gtt overnight.  - Effient /ASA, will have to remain on DAPT indefinitely.  - Statin on hold with elevated LFTs.  2. Cardiogenic shock: Ischemic cardiomyopathy, initial echo with EF 40-45%, mild-moderate MR.  Lactate to 3 in setting of stent thrombosis.  Dr. Ladona placed IABP prior to PTCA LAD.  Currently on NE and vasopressin  but BP improving.  - Can wean NE and vasopressin  as able.  - Continue IABP 1:1, heparin  gtt for IABP.  - Trend lactate.  [facility] to stay in place.  - He will not be candidate for advanced therapies, ECMO, etc with newly discovered advanced gastric cancer.  3. VT: In setting of stent thrombosis.  - Continue amiodarone  gtt.  4. Gastric cancer: Associated with  biliary and pancreatic duct obstruction.  New diagnosis.  He has a new percutaneous transhepatic biliary drain. We await formal oncology evaluation but cancer seems advanced.   CRITICAL CARE Performed by: Ezra Shuck  Total critical care time: 70 minutes  Critical care time was exclusive of separately billable procedures and treating other patients.  Critical care was necessary to treat or prevent imminent or life-threatening deterioration.  Critical care was time spent personally by me on the following activities: development of treatment plan with patient and/or surrogate as well as nursing, discussions with consultants, evaluation of patient's response to treatment, examination of patient, obtaining history from patient or surrogate, ordering and performing treatments and interventions, ordering and review of laboratory studies, ordering and review of radiographic studies, pulse oximetry and re-evaluation of patient's condition.    Length of Stay: 2  Ezra Shuck, MD  05/03/2024, 5:51 PM  Advanced Heart Failure Team Pager [phone] (M-F; 7a - 5p)   Please visit [email or web address]: For overnight coverage please call cardiology fellow first. If fellow not available call Shock/ECMO MD on call.  For ECMO / Mechanical Support (Impella, IABP, LVAD) issues call Shock / ECMO MD on call.       [1]  Allergies Allergen Reactions   Lisinopril Cough   Nitroglycerin  Other (See Comments)    Blood pressure drop   Penicillins Rash   "

## 2024-05-03 NOTE — Progress Notes (Signed)
 Patient received from [facility]. Placed on stretcher and connected to monitors.  Patient is spanish speaking and communicated with this nurse in native language without any issue. Patient signed consent and has prior knowledge of procedure since he has had a stent placed previously. No complaints of chest pain or any at all. Call bell given to patient and advised to call if in need of nurse. Waiting for team to pick up for procedure.

## 2024-05-03 NOTE — Consult Note (Addendum)
 [facility] CONSULT NOTE  Patient Care Team: Saguier, [place] RIGGERS as PCP - General (Internal Medicine) Court Dorn PARAS, MD as PCP - Cardiology (Cardiology) Court Dorn PARAS, MD as Consulting Physician (Cardiology)  CHIEF COMPLAINTS/PURPOSE OF CONSULTATION:  Gastric adenocarcinoma  REFERRING PHYSICIAN: Dr. Will  HISTORY OF PRESENTING ILLNESS:  Bryan Craig 74 y.o. male who presented on 05/01/2024 for biliary dilation.  Patient had previously been followed by [facility]enterology due to elevated LFTs.  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  Therefore oncology evaluation has been requested. Patient is seen awake alert and oriented x 4 laying in bed.  He is frail and cachectic appearing.  Very pleasant.  His daughter Bryan Craig is also at bedside.  Introduced self as a part of the oncology team, patient and daughter expressed surprise regarding oncologic involvement because they were not aware of his cancer diagnosis.   Discussed his medical history and chief complaints.  Patient reports that over 6 months ago whenever he ate, he would become full very easily.  He noticed that he was losing a lot of weight and daughter confirms that he has lost over 30 pounds in a 26-month.  Denies nausea, vomiting or melena.  Denies acute pain however admits to abdominal discomfort. Medical history includes coronary artery disease, hypertension, hyperlipidemia, and hepatitis in childhood.  Of note, patient had NSTEMI 2 nights ago. Surgical history includes cardiac stent and graft in 2024. Family history includes patient's mother with unknown cancer, thinks may be stomach or liver. Social history is noncontributory, denies tobacco use, alcohol use, illicit or recreational drug use.  Patient formerly worked associate professor.    I have reviewed his chart and materials related to his cancer extensively and collaborated history with the patient. Summary of oncologic  history is as follows: Oncology History   No problem history exists.    ASSESSMENT & PLAN:  Gastric adenocarcinoma H. 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  No pancreatic head mass was seen however could not be excluded. - Tumor markers were done 04/14/2024.  CA 19-9 elevated 356.  AFP 1.6 WNL.  Ordered CEA. - GI team following closely - Medical oncology/Dr. Federico will make further recommendations.  Anemia, microcytic - Hemoglobin stable 10.4 - Recommend PRBC transfusion for hemoglobin <7.0 - No transfusional intervention required at this time - Continue to monitor CBC with differential  Thrombocytosis - Platelets elevated 565K - Likely reactive - Continue to monitor CBC with differential  Transaminitis Hyperbilirubinemia Biliary obstruction - Secondary to malignancy - Imaging done 05/01/2024 shows biliary ductal dilation. - Status post percutaneous transhepatic biliary tube placement on 05/02/2024 - Avoid hepatotoxic agents - Continue to monitor hepatic function  NSTEMI CAD - Pending cardiac cath today at [facility] - Cardiology managing    MEDICAL HISTORY:  Past Medical History:  Diagnosis Date   Anemia    CAD (coronary artery disease)    Hepatitis    as a child   History of kidney stones    Hyperlipidemia    Hypertension    Myocardial infarction [facility])    Retinal detachment     SURGICAL HISTORY: Past Surgical History:  Procedure Laterality Date   BIOPSY OF SKIN SUBCUTANEOUS TISSUE AND/OR MUCOUS MEMBRANE  05/01/2024   Procedure: BIOPSY, SKIN, SUBCUTANEOUS TISSUE, OR MUCOUS MEMBRANE;  Surgeon: Wilhelmenia [name], MD;  Location: [facility];  Service: Gastroenterology;;   CORONARY STENT  INTERVENTION N/A 07/22/2022   Procedure: CORONARY STENT INTERVENTION;  Surgeon: Verlin Lonni BIRCH, MD;  Location: [facility];  Service: Cardiovascular;  Laterality: N/A;   CORONARY/GRAFT ACUTE MI REVASCULARIZATION N/A 01/24/2023   Procedure: Coronary/Graft Acute MI Revascularization;  Surgeon: Verlin Lonni BIRCH, MD;  Location: [facility];  Service: Cardiovascular;  Laterality: N/A;   ESOPHAGOGASTRODUODENOSCOPY N/A 05/01/2024   Procedure: EGD (ESOPHAGOGASTRODUODENOSCOPY);  Surgeon: Wilhelmenia [name], MD;  Location: [facility];  Service: Gastroenterology;  Laterality: N/A;   EUS N/A 05/01/2024   Procedure: ULTRASOUND, UPPER GI TRACT, ENDOSCOPIC;  Surgeon: Wilhelmenia [name], MD;  Location: [facility];  Service: Gastroenterology;  Laterality: N/A;   EYE SURGERY     LEFT HEART CATH AND CORONARY ANGIOGRAPHY N/A 07/22/2022   Procedure: LEFT HEART CATH AND CORONARY ANGIOGRAPHY;  Surgeon: Verlin Lonni BIRCH, MD;  Location: [facility];  Service: Cardiovascular;  Laterality: N/A;   LEFT HEART CATH AND CORONARY ANGIOGRAPHY N/A 01/24/2023   Procedure: LEFT HEART CATH AND CORONARY ANGIOGRAPHY;  Surgeon: Verlin Lonni BIRCH, MD;  Location: [facility];  Service: Cardiovascular;  Laterality: N/A;    SOCIAL HISTORY: Social History   Socioeconomic History   Marital status: Married    Spouse name: Lonell   Number of children: 3   Years of education: Not on file   Highest education level: Not on file  Occupational History   Occupation: retired    Comment: civil service fast streamer  Tobacco Use   Smoking status: Never   Smokeless tobacco: Never  Vaping Use   Vaping status: Never Used  Substance and Sexual Activity   Alcohol use: Not Currently   Drug use: Never   Sexual activity: Yes  Other Topics Concern   Not on file  Social History Narrative   Married, 2 daughters with current wife and 1 daughter from previous marriage   Social  Drivers of Health   Tobacco Use: Low Risk (05/01/2024)   Patient History    Smoking Tobacco Use: Never    Smokeless Tobacco Use: Never    Passive Exposure: Not on file  Financial Resource Strain: Low Risk (11/30/2023)   Overall Financial Resource Strain (CARDIA)    Difficulty of Paying Living Expenses: Not hard at all  Food Insecurity: No Food Insecurity (05/01/2024)   Epic    Worried About Radiation Protection Practitioner of Food in the Last Year: Never true    Ran Out of Food in the Last Year: Never true  Transportation Needs: No Transportation Needs (05/01/2024)   Epic    Lack of Transportation (Medical): No    Lack of Transportation (Non-Medical): No  Physical Activity: Sufficiently Active (11/30/2023)   Exercise Vital Sign    Days of Exercise per Week: 7 days    Minutes of Exercise per Session: 50 min  Stress: No Stress Concern Present (11/30/2023)   [employer] of Occupational Health - Occupational Stress Questionnaire    Feeling of Stress: Not at all  Social Connections: Moderately Isolated (05/01/2024)   Social Connection and Isolation Panel    Frequency of Communication with Friends and Family: More than three times a week    Frequency of Social Gatherings with Friends and Family: More than three times a week    Attends Religious Services: Never    Database Administrator or Organizations: No    Attends Banker Meetings: Never  Marital Status: Married  Catering Manager Violence: Not At Risk (05/01/2024)   Epic    Fear of Current or Ex-Partner: No    Emotionally Abused: No    Physically Abused: No    Sexually Abused: No  Depression (PHQ2-9): Low Risk (11/30/2023)   Depression (PHQ2-9)    PHQ-2 Score: 0  Alcohol Screen: Low Risk (11/30/2023)   Alcohol Screen    Last Alcohol Screening Score (AUDIT): 2  Housing: Low Risk (05/01/2024)   Epic    Unable to Pay for Housing in the Last Year: No    Number of Times Moved in the Last Year: 0    Homeless in the Last Year: No  Utilities: Not At  Risk (05/01/2024)   Epic    Threatened with loss of utilities: No  Health Literacy: Adequate Health Literacy (11/30/2023)   B1300 Health Literacy    Frequency of need for help with medical instructions: Never    FAMILY HISTORY: Family History  Problem Relation Age of Onset   Hyperlipidemia Mother    Hypertension Mother    Heart disease Mother    Heart attack Father 32     PHYSICAL EXAMINATION: ECOG PERFORMANCE STATUS: 3 - Symptomatic, >50% confined to bed  Vitals:   05/03/24 0730 05/03/24 0800  BP: 127/76 128/72  Pulse: 69 69  Resp: 14 18  Temp:  98.4 F (36.9 C)  SpO2: 100% 100%   Filed Weights   05/01/24 0659 05/02/24 1121  Weight: 147 lb (66.7 kg) 150 lb 12.7 oz (68.4 kg)    GENERAL: alert, no distress and comfortable + frail appearing + cachectic SKIN: + Slightly jaundiced skin color, texture, turgor are normal, no rashes or significant lesions EYES: + Mild scleral icterus OROPHARYNX: no exudate, no erythema and lips, buccal mucosa, and tongue normal  NECK: supple, thyroid  normal size, non-tender, without nodularity LYMPH: no palpable lymphadenopathy in the cervical, axillary or inguinal LUNGS: clear to auscultation and percussion with normal breathing effort HEART: regular rate & rhythm and no murmurs and no lower extremity edema ABDOMEN: abdomen soft, non-tender and normal bowel sounds MUSCULOSKELETAL: no cyanosis of digits and no clubbing  PSYCH: alert & oriented x 3 with fluent speech NEURO: no focal motor/sensory deficits   ALLERGIES:  is allergic to lisinopril, nitroglycerin , and penicillins.  MEDICATIONS:  Current Facility-Administered Medications  Medication Dose Route Frequency Provider Last Rate Last Admin   0.9 %  sodium chloride  infusion   Intravenous Continuous Will Almarie MATSU, MD 100 mL/hr at 05/03/24 0305 Infusion Verify at 05/03/24 0305   0.9 %  sodium chloride  infusion   Intravenous Continuous Parcells, Waddell LABOR, PA-C       albuterol   (PROVENTIL ) (2.5 MG/3ML) 0.083% nebulizer solution 2.5 mg  2.5 mg Nebulization Q2H PRN Zella, Mir M, MD       aspirin  chewable tablet 81 mg  81 mg Oral Pre-Cath Parcells, Taylor A, PA-C       aspirin  EC tablet 81 mg  81 mg Oral Daily Parcells, Taylor A, PA-C       Chlorhexidine  Gluconate Cloth 2 % PADS 6 each  6 each Topical Daily Will Almarie MATSU, MD   6 each at 05/02/24 1140   HYDROmorphone  (DILAUDID ) injection 0.5-1 mg  0.5-1 mg Intravenous Q2H PRN Zella, Mir M, MD   0.5 mg at 05/01/24 2316   iohexol  (OMNIPAQUE ) 300 MG/ML solution 50 mL  50 mL Other Once PRN Hughes Simmonds, MD       irbesartan  (AVAPRO ) tablet  150 mg  150 mg Oral Daily Pietro Redell RAMAN, MD   150 mg at 05/02/24 1256   lidocaine  (LIDODERM ) 5 % 1 patch  1 patch Transdermal Once Daniels, James K, NP   1 patch at 05/03/24 0242   lidocaine -EPINEPHrine  (XYLOCAINE  W/EPI) 1 %-1:100000 (with pres) injection 20 mL  20 mL Intradermal Once Hughes Simmonds, MD       metoprolol  succinate (TOPROL -XL) 24 hr tablet 50 mg  50 mg Oral Daily Pietro Redell RAMAN, MD   50 mg at 05/02/24 1256   ondansetron  (ZOFRAN ) tablet 4 mg  4 mg Oral Q6H PRN Zella Katha HERO, MD       Or   ondansetron  (ZOFRAN ) injection 4 mg  4 mg Intravenous Q6H PRN Zella Katha HERO, MD       Oral care mouth rinse  15 mL Mouth Rinse PRN Will Almarie MATSU, MD       oxyCODONE  (Oxy IR/ROXICODONE ) immediate release tablet 5 mg  5 mg Oral Q4H PRN Zella, Mir M, MD   5 mg at 05/01/24 2349   pantoprazole  (PROTONIX ) injection 40 mg  40 mg Intravenous Daily Cara Elida HERO, NP   40 mg at 05/02/24 1155   sodium chloride  flush (NS) 0.9 % injection 5 mL  5 mL Intracatheter Q8H Mugweru, Jon, MD   5 mL at 05/03/24 9377   traZODone  (DESYREL ) tablet 25 mg  25 mg Oral QHS PRN Zella Katha HERO, MD         LABORATORY DATA:  I have reviewed the data as listed Lab Results  Component Value Date   WBC 10.3 05/03/2024   HGB 10.4 (L) 05/03/2024   HCT 35.1 (L) 05/03/2024    MCV 68.8 (L) 05/03/2024   PLT 565 (H) 05/03/2024   Recent Labs    05/01/24 0922 05/02/24 0020 05/02/24 0438 05/03/24 0745  NA 136 136 135 133*  K 4.3 4.1 4.1 4.5  CL 103 103 102 100  CO2 26 24 25 24   GLUCOSE 129* 140* 137* 122*  BUN 11 13 12 8   CREATININE 0.64 0.70 0.62 0.59*  CALCIUM  8.0* 8.8* 8.2* 8.6*  GFRNONAA >60 >60 >60 >60  PROT 5.0*  --  5.3* 5.6*  ALBUMIN 2.8*  --  2.9* 2.9*  AST 248*  --  187* 441*  ALT 339*  --  311* 384*  ALKPHOS 1,114*  --  1,039* 967*  BILITOT 2.0*  --  1.3* 1.3*  BILIDIR 1.6*  --   --   --   IBILI 0.4  --   --   --     RADIOGRAPHIC STUDIES: I have personally reviewed the radiological images as listed and agreed with the findings in the report. DG CHEST PORT 1 VIEW Result Date: 05/02/2024 CLINICAL DATA:  Chest pain. Status post upper endoscopy and endoscopic ultrasound yesterday. EXAM: PORTABLE CHEST 1 VIEW COMPARISON:  01/24/2023. FINDINGS: Normal-sized heart. Decreased inspiration with mild bibasilar linear atelectasis. Remainder of the lungs are clear. No pneumothorax or pneumoperitoneum seen. Mild thoracolumbar spine degenerative changes. IMPRESSION: Decreased inspiration with mild bibasilar linear atelectasis. Electronically Signed   By: Elspeth Bathe M.D.   On: 05/02/2024 16:45   ECHOCARDIOGRAM COMPLETE Result Date: 05/02/2024    ECHOCARDIOGRAM REPORT   Patient Name:   WILMER SANTILLO Date of Exam: 05/02/2024 Medical Rec #:  [id number]     Height:       67.0 in Accession #:    [id number]    Weight:  150.8 lb Date of Birth:  Nov 15, 1950     BSA:          1.794 m Patient Age:    73 years      BP:           155/129 mmHg Patient Gender: M             HR:           82 bpm. Exam Location:  Inpatient Procedure: 2D Echo, Cardiac Doppler, Color Doppler and Intracardiac            Opacification Agent (Both Spectral and Color Flow Doppler were            utilized during procedure). Indications:    NSTEMI I21.4  History:        Patient has prior history of  Echocardiogram examinations, most                 recent 06/15/2023. CAD; Risk Factors:Hypertension.  Sonographer:    Gwendolynn Blush RDCS Referring Phys: [id number] BRIAN S CRENSHAW IMPRESSIONS  1. Left ventricular ejection fraction, by estimation, is 40 to 45%. The left ventricle has mildly decreased function. The left ventricle demonstrates regional wall motion abnormalities (see scoring diagram/findings for description). Left ventricular diastolic parameters are consistent with Grade I diastolic dysfunction (impaired relaxation).  2. Right ventricular systolic function is normal. The right ventricular size is normal.  3. The mitral valve is grossly normal. Mild to moderate mitral valve regurgitation. No evidence of mitral stenosis.  4. The aortic valve was not well visualized. Aortic valve regurgitation is trivial. No aortic stenosis is present. Comparison(s): No significant change from prior study. Prior images reviewed side by side. FINDINGS  Left Ventricle: Left ventricular ejection fraction, by estimation, is 40 to 45%. The left ventricle has mildly decreased function. The left ventricle demonstrates regional wall motion abnormalities. Definity  contrast agent was given IV to delineate the left ventricular endocardial borders. The left ventricular internal cavity size was normal in size. There is no left ventricular hypertrophy. Left ventricular diastolic parameters are consistent with Grade I diastolic dysfunction (impaired relaxation).  LV Wall Scoring: The apical lateral segment, apical anterior segment, and apical inferior segment are akinetic. The posterior wall is hypokinetic. The apical septal segment is normal. Right Ventricle: The right ventricular size is normal. No increase in right ventricular wall thickness. Right ventricular systolic function is normal. Left Atrium: Left atrial size was normal in size. Right Atrium: Right atrial size was normal in size. Pericardium: There is no evidence of pericardial  effusion. Mitral Valve: The mitral valve is grossly normal. Mild to moderate mitral valve regurgitation. No evidence of mitral valve stenosis. Tricuspid Valve: The tricuspid valve is normal in structure. Tricuspid valve regurgitation is not demonstrated. No evidence of tricuspid stenosis. Aortic Valve: The aortic valve was not well visualized. Aortic valve regurgitation is trivial. No aortic stenosis is present. Pulmonic Valve: The pulmonic valve was not well visualized. Pulmonic valve regurgitation is not visualized. No evidence of pulmonic stenosis. Aorta: The aortic root and ascending aorta are structurally normal, with no evidence of dilitation. IAS/Shunts: No atrial level shunt detected by color flow Doppler.  LEFT VENTRICLE PLAX 2D LVIDd:         5.10 cm      Diastology LVIDs:         4.50 cm      LV e' medial:    6.96 cm/s LV PW:  1.00 cm      LV E/e' medial:  9.8 LV IVS:        1.20 cm      LV e' lateral:   6.64 cm/s LVOT diam:     2.00 cm      LV E/e' lateral: 10.3 LV SV:         72 LV SV Index:   40 LVOT Area:     3.14 cm  LV Volumes (MOD) LV vol d, MOD A2C: 162.0 ml LV vol d, MOD A4C: 146.0 ml LV vol s, MOD A2C: 90.1 ml LV vol s, MOD A4C: 84.8 ml LV SV MOD A2C:     71.9 ml LV SV MOD A4C:     146.0 ml LV SV MOD BP:      64.9 ml RIGHT VENTRICLE RV S prime:     14.00 cm/s TAPSE (M-mode): 1.8 cm LEFT ATRIUM             Index        RIGHT ATRIUM           Index LA diam:        3.60 cm 2.01 cm/m   RA Area:     10.10 cm LA Vol (A2C):   46.1 ml 25.70 ml/m  RA Volume:   19.20 ml  10.71 ml/m LA Vol (A4C):   32.9 ml 18.34 ml/m LA Biplane Vol: 40.2 ml 22.41 ml/m  AORTIC VALVE LVOT Vmax:   110.00 cm/s LVOT Vmean:  68.600 cm/s LVOT VTI:    0.230 m  AORTA Ao Root diam: 3.50 cm Ao Asc diam:  3.00 cm MITRAL VALVE               TRICUSPID VALVE MV Area (PHT): 3.86 cm    TR Peak grad:   39.2 mmHg MV E velocity: 68.10 cm/s  TR Vmax:        313.00 cm/s MV A velocity: 98.10 cm/s MV E/A ratio:  0.69        SHUNTS                             Systemic VTI:  0.23 m                            Systemic Diam: 2.00 cm Stanly Leavens MD Electronically signed by Stanly Leavens MD Signature Date/Time: 05/02/2024/3:15:29 PM    Final    CT ABDOMEN PELVIS W CONTRAST Result Date: 05/01/2024 EXAM: CT ABDOMEN AND PELVIS WITH CONTRAST 05/01/2024 04:45:00 PM TECHNIQUE: CT of the abdomen and pelvis was performed with the administration of 80 mL of iohexol  (OMNIPAQUE ) 350 MG/ML injection. Multiplanar reformatted images are provided for review. Automated exposure control, iterative reconstruction, and/or weight-based adjustment of the mA/kV was utilized to reduce the radiation dose to as low as reasonably achievable. COMPARISON: 08/03/2022, 03/28/2024 CLINICAL HISTORY: Biliary obstruction suspected (Ped 0-17y); Per discussion with IR (perform CT-Abdomen as Pancreas Protocol. FINDINGS: LOWER CHEST: No acute abnormality. LIVER: The liver is unremarkable. GALLBLADDER AND BILE DUCTS: Diffusely dilated and fluid filled gallbladder without wall thickening or radiopaque stones. Similar severe diffuse intrahepatic and extrahepatic biliary ductal dilation with focal short segment narrowing of the mid CBD. No radiopaque choledocholithiasis. SPLEEN: No acute abnormality. 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 ADRENAL GLANDS: No acute abnormality. KIDNEYS, URETERS AND BLADDER: Small  nonobstructive bilateral calyceal calculi. No hydronephrosis in either kidney. No perinephric or periureteral stranding. Urinary bladder is unremarkable. GI AND BOWEL: Moderate wall thickening throughout the stomach. Colonic diverticulosis. No changes of acute diverticulitis. Normal gas filled appendix. Right inguinal hernia containing a short segment of nondistended ileum. No associated bowel obstruction. No extravasation of enteric contrast to suggest bowel perforation. PERITONEUM AND  RETROPERITONEUM: No ascites. No free air. VASCULATURE: Aorta is normal in caliber. Retroaortic left renal vein. Diffuse aortoiliac atherosclerosis. LYMPH NODES: No lymphadenopathy. REPRODUCTIVE ORGANS: Severe prostatomegaly. BONES AND SOFT TISSUES: Multilevel degenerative disc disease of the spine. No acute osseous abnormality. No focal soft tissue abnormality. IMPRESSION: 1. No acute intraabdominal or pelvic abnormality. 2. Similar severe, diffuse intrahepatic and extrahepatic biliary ductal dilation with short segment narrowing of the mid CBD. 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 Follow-up ERCP with EUS recommended for the further characterization. 3. Moderate wall thickening throughout the stomach, which may be due to underdistention or gastritis. 4. Scattered colonic diverticulosis. No changes of acute diverticulitis. 5. Severe prostatomegaly. Electronically signed by: Rogelia Myers MD 05/01/2024 06:01 PM EST RP Workstation: [id number]     I personally spent a total of 55 minutes minutes in the care of the patient today including preparing to see the patient, getting/reviewing separately obtained history, performing a medically appropriate exam/evaluation, counseling and educating, placing orders, referring and communicating with other health care professionals, documenting clinical information in the EHR, communicating results, and coordinating care.    All questions were answered. The patient knows to call the clinic with any problems, questions or concerns. No barriers to learning was detected.  Olam PARAS Rouson, NP 1/7/20269:17 AM    I have read the above note and personally examined the patient. I agree with the assessment and  plan as noted above.  Briefly Bryan Craig is a 74 year old male who was admitted to the hospital on 05/01/2024 with biliary dilatation.  He had been previously followed by [facility] gastroenterology due to elevated LFTs.  ERCP was begun but discontinued due to severe ulceration of the gastric body and antrum.  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  Percutaneous bili drain was placed instead on 05/02/2024.  Additionally patient developed an NSTEMI while in the hospital and underwent cardiac catheterization.  PCI and intra-aortic balloon pump were placed on 05/03/2024.  Oncology was consulted for further evaluation and management of this patient's newly diagnosed malignancy.  Today I spoke with the patient and his daughter.  Patient is aware of the diagnosis but detailed conversations regarding prognosis and steps moving forward were discussed with the daughter.  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  Also with his degree of biliary dysfunction and cardiac disease I am concerned that he may not be a candidate for intensive chemotherapy.  We discussed that radiation and surgery are not viable options for treatment of his malignancy and that all treatments would be palliative in nature to slow the growth of the tumor.  Additionally this is an aggressive malignancy with a poor prognosis in the best of circumstances ( <6 months on average with optimal treatment).  Given his advanced age and comorbidities I do believe a comfort based care approach would be most appropriate.  The patient's daughter seemed very open to this.  They are agreeable to meeting with our palliative care service regarding options for comfort based care.  At this time would recommend continuing full supportive care with pressors and intra-aortic balloon pump.    Oncology service will continue to follow and help provide recommendations.   Norleen IVAR Kidney, MD Department of Hematology/Oncology [facility] Phone: [phone] Pager: [phone] Email: [email or web address]

## 2024-05-04 ENCOUNTER — Encounter (HOSPITAL_COMMUNITY): Payer: Self-pay | Admitting: Cardiology

## 2024-05-04 ENCOUNTER — Inpatient Hospital Stay (HOSPITAL_COMMUNITY)

## 2024-05-04 DIAGNOSIS — E8770 Fluid overload, unspecified: Secondary | ICD-10-CM

## 2024-05-04 DIAGNOSIS — C169 Malignant neoplasm of stomach, unspecified: Secondary | ICD-10-CM

## 2024-05-04 DIAGNOSIS — K831 Obstruction of bile duct: Secondary | ICD-10-CM | POA: Diagnosis not present

## 2024-05-04 DIAGNOSIS — D75839 Thrombocytosis, unspecified: Secondary | ICD-10-CM

## 2024-05-04 DIAGNOSIS — I509 Heart failure, unspecified: Secondary | ICD-10-CM

## 2024-05-04 DIAGNOSIS — E872 Acidosis, unspecified: Secondary | ICD-10-CM

## 2024-05-04 DIAGNOSIS — C801 Malignant (primary) neoplasm, unspecified: Secondary | ICD-10-CM

## 2024-05-04 LAB — COMPREHENSIVE METABOLIC PANEL WITH GFR
ALT: 298 U/L — ABNORMAL HIGH (ref 0–44)
AST: 260 U/L — ABNORMAL HIGH (ref 15–41)
Albumin: 2.8 g/dL — ABNORMAL LOW (ref 3.5–5.0)
Alkaline Phosphatase: 749 U/L — ABNORMAL HIGH (ref 38–126)
Anion gap: 7 (ref 5–15)
BUN: 17 mg/dL (ref 8–23)
CO2: 26 mmol/L (ref 22–32)
Calcium: 7.7 mg/dL — ABNORMAL LOW (ref 8.9–10.3)
Chloride: 99 mmol/L (ref 98–111)
Creatinine, Ser: 0.62 mg/dL (ref 0.61–1.24)
GFR, Estimated: >60 mL/min
Glucose, Bld: 154 mg/dL — ABNORMAL HIGH (ref 70–99)
Potassium: 3.7 mmol/L (ref 3.5–5.1)
Sodium: 132 mmol/L — ABNORMAL LOW (ref 135–145)
Total Bilirubin: 0.9 mg/dL (ref 0.0–1.2)
Total Protein: 5 g/dL — ABNORMAL LOW (ref 6.5–8.1)

## 2024-05-04 LAB — ECHOCARDIOGRAM COMPLETE
Area-P 1/2: 3.6 cm2
Calc EF: 27.6 %
Height: 67 in
S' Lateral: 4.1 cm
Single Plane A2C EF: 35.3 %
Single Plane A4C EF: 18.7 %
Weight: 2412.71 [oz_av]

## 2024-05-04 LAB — COOXEMETRY PANEL
Carboxyhemoglobin: 2.4 % — ABNORMAL HIGH (ref 0.5–1.5)
Carboxyhemoglobin: 1.6 % — ABNORMAL HIGH (ref 0.5–1.5)
Methemoglobin: <0.7 % (ref 0.0–1.5)
Methemoglobin: <0.7 % (ref 0.0–1.5)
O2 Saturation: 63.9 %
O2 Saturation: 59.1 %
Total hemoglobin: 10.3 g/dL — ABNORMAL LOW (ref 12.0–16.0)
Total hemoglobin: 10.1 g/dL — ABNORMAL LOW (ref 12.0–16.0)

## 2024-05-04 LAB — CBC
HCT: 32.7 % — ABNORMAL LOW (ref 39.0–52.0)
Hemoglobin: 9.8 g/dL — ABNORMAL LOW (ref 13.0–17.0)
MCH: 20.5 pg — ABNORMAL LOW (ref 26.0–34.0)
MCHC: 30 g/dL (ref 30.0–36.0)
MCV: 68.6 fL — ABNORMAL LOW (ref 80.0–100.0)
Platelets: 566 K/uL — ABNORMAL HIGH (ref 150–400)
RBC: 4.77 MIL/uL (ref 4.22–5.81)
RDW: 24.7 % — ABNORMAL HIGH (ref 11.5–15.5)
WBC: 14.5 K/uL — ABNORMAL HIGH (ref 4.0–10.5)
nRBC: 0 % (ref 0.0–0.2)

## 2024-05-04 LAB — HEPARIN LEVEL (UNFRACTIONATED)
Heparin Unfractionated: 0.28 [IU]/mL — ABNORMAL LOW (ref 0.30–0.70)
Heparin Unfractionated: <0.1 [IU]/mL — ABNORMAL LOW (ref 0.30–0.70)
Heparin Unfractionated: <0.1 [IU]/mL — ABNORMAL LOW (ref 0.30–0.70)
Heparin Unfractionated: <0.1 [IU]/mL — ABNORMAL LOW (ref 0.30–0.70)

## 2024-05-04 LAB — MAGNESIUM: Magnesium: 2 mg/dL (ref 1.7–2.4)

## 2024-05-04 LAB — TSH: TSH: 1.08 u[IU]/mL (ref 0.350–4.500)

## 2024-05-04 LAB — CYTOLOGY - NON PAP

## 2024-05-04 LAB — LACTIC ACID, PLASMA
Lactic Acid, Venous: 2.7 mmol/L (ref 0.5–1.9)
Lactic Acid, Venous: 2 mmol/L (ref 0.5–1.9)
Lactic Acid, Venous: 1.5 mmol/L (ref 0.5–1.9)

## 2024-05-04 LAB — PHOSPHORUS: Phosphorus: 2.3 mg/dL — ABNORMAL LOW (ref 2.5–4.6)

## 2024-05-04 MED ORDER — PERFLUTREN LIPID MICROSPHERE
1.0000 mL | INTRAVENOUS | Status: AC | PRN
  Administered 2024-05-04: 2 mL via INTRAVENOUS

## 2024-05-04 MED ORDER — CALCIUM GLUCONATE-NACL 1-0.675 GM/50ML-% IV SOLN
1.0000 g | Freq: Once | INTRAVENOUS | Status: AC
  Administered 2024-05-04: 1000 mg via INTRAVENOUS
  Filled 2024-05-04: qty 50

## 2024-05-04 MED ORDER — POTASSIUM CHLORIDE CRYS ER 20 MEQ PO TBCR
40.0000 meq | EXTENDED_RELEASE_TABLET | Freq: Once | ORAL | Status: AC
  Administered 2024-05-04: 40 meq via ORAL
  Filled 2024-05-04: qty 2

## 2024-05-04 MED ORDER — SODIUM PHOSPHATES 45 MMOLE/15ML IV SOLN
15.0000 mmol | Freq: Once | INTRAVENOUS | Status: AC
  Administered 2024-05-04: 15 mmol via INTRAVENOUS
  Filled 2024-05-04: qty 5

## 2024-05-04 MED ORDER — DOBUTAMINE-DEXTROSE 4-5 MG/ML-% IV SOLN
2.5000 ug/kg/min | INTRAVENOUS | Status: DC
  Administered 2024-05-04: 2.5 ug/kg/min via INTRAVENOUS
  Filled 2024-05-04 (×2): qty 250

## 2024-05-04 MED ORDER — ENSURE PLUS HIGH PROTEIN PO LIQD
237.0000 mL | Freq: Three times a day (TID) | ORAL | Status: DC
  Administered 2024-05-04 – 2024-05-07 (×7): 237 mL via ORAL

## 2024-05-04 MED ORDER — DIGOXIN 125 MCG PO TABS
0.1250 mg | ORAL_TABLET | Freq: Every day | ORAL | Status: DC
  Administered 2024-05-04 – 2024-05-07 (×4): 0.125 mg via ORAL
  Filled 2024-05-04 (×4): qty 1

## 2024-05-04 MED ORDER — POTASSIUM CHLORIDE CRYS ER 20 MEQ PO TBCR: 40.0000 meq | EXTENDED_RELEASE_TABLET | Freq: Once | ORAL | Status: DC

## 2024-05-04 NOTE — Progress Notes (Signed)
" °  Echocardiogram 2D Echocardiogram has been performed.  Devora Ellouise SAUNDERS 05/04/2024, 11:04 AM "

## 2024-05-04 NOTE — Procedures (Signed)
 Central Venous Catheter Insertion Procedure Note  March Steyer  [id number]  [birth year]  Date:05/04/2024  Time:12:51 AM   Provider Performing:Pete FORBES Jenna   Procedure: Insertion of Non-tunneled Central Venous [id number]) with US  guidance (23062)   Indication(s) Medication administration  Consent Risks of the procedure as well as the alternatives and risks of each were explained to the patient and/or caregiver.  Consent for the procedure was obtained and is signed in the bedside chart Patient and daughter agreed verbally in semiemergent situation given ongoing shock Anesthesia Topical only with 1% lidocaine    Timeout Verified patient identification, verified procedure, site/side was marked, verified correct patient position, special equipment/implants available, medications/allergies/relevant history reviewed, required imaging and test results available.  Sterile Technique Maximal sterile technique including full sterile barrier drape, hand hygiene, sterile gown, sterile gloves, mask, hair covering, sterile ultrasound probe cover (if used).  Procedure Description Area of catheter insertion was cleaned with chlorhexidine  and draped in sterile fashion.  With real-time ultrasound guidance a central venous catheter was placed into the left internal jugular vein. Nonpulsatile blood flow and easy flushing noted in all ports.  The catheter was sutured in place and sterile dressing applied.  Complications/Tolerance None; patient tolerated the procedure well. Chest X-ray is ordered to verify placement for internal jugular or subclavian cannulation.   Chest x-ray is not ordered for femoral cannulation.  EBL Minimal  Specimen(s) None

## 2024-05-04 NOTE — Progress Notes (Signed)
 "  NAME:  Bryan Craig, MRN:  [id number], DOB:  05/09/1950, LOS: 3 ADMISSION DATE:  05/01/2024, CONSULTATION DATE:  05/03/24 REFERRING MD:  AHF, CHIEF COMPLAINT:  chest pain   History of Present Illness:  74 year old man with known biliary dilation and elevated LFTs presenting for OP EUS and possible ERCP 1/5 abandoned due to severe ulceration of gastric body and antrum; this area was biopsied and patient admitted for observation and IR consult.  Unfortunately yesterday developed chest pain, dizziness and diagnosed with NSTEMI.   Came to [facility] and had LHC today (assuring no GIB post biopsy) with findings of occluded occluded ramus and high grade LAD narrow, latter was stented; 2 hours later had VTACH with redo LHC showing in-stent thrombosis. This procedure complicated by recurrent runs of VT that have since ablated; IABP placed and patient admitted to [facility] on multiple pressors.  As patient was on TRH service he is now on PCCM service.  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  Pertinent  Medical History  CAD HLD HTN Retinal detachment  Significant Hospital Events: Including procedures, antibiotic start and stop dates in addition to other pertinent events   1/5: admitted for NSTEMI  s/p PCI and IABP on 1/7  Interim History / Subjective:  Consult  Objective    Blood pressure 118/86, pulse 82, temperature 99.1 F (37.3 C), temperature source Core, resp. rate (!) 21, height 5' 7 (1.702 m), weight 68.4 kg, SpO2 98%. PAP: (17-41)/(1-22) 19/5 CVP:  [0 mmHg-11 mmHg] 4 mmHg PCWP:  [19 mmHg] 19 mmHg CO:  [3.5 L/min-6.1 L/min] 6.1 L/min CI:  [1.75 L/min/m2-3 L/min/m2] 3 L/min/m2      Intake/Output Summary (Last 24 hours) at 05/04/2024 1227 Last data filed at 05/04/2024 1200 Gross per 24 hour  Intake 3562.99 ml  Output 1090 ml  Net 2472.99 ml   Filed Weights   05/01/24 0659 05/02/24 1121  Weight: 66.7 kg 68.4 kg    Examination: General: no acute  distress, resting comfortably in bed  HENT: MMM, sclera anicteric  Lungs: breathing comfortably on room air, clear  Cardiovascular: RRR, IABP heard  Abdomen: soft, non-tender, non-distended RUQ drain with bilious output  Extremities: warm, dry, no edema Neuro: alert and oriented, moves all extremities spontaneously  GU: foley in place with dark yellow urine IABP and swan sites soft without evidence of bleeding or hematoma   Resolved problem list   Assessment and Plan   NSTEMI s/p LAD stenting early afternoon 1/7 c/b in-stent thrombosis manifesting as VT storm later afternoon 1/7 Post cath cardioplegia/ cardiogenic shock Ventricular tachycardia New diagnosis of 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- s/p IR guided biliary drain before LHC above, onc consulted 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 Hyponatremia, mild likely due to hypervolemia - monitor Lactic acidosis - trending down - Continue IABP 1:1 - Patient is off vasopressin  Levophed  is being weaned down currently at 14 mcg/min - DAPT -aspirin  and prasugrel , 1:1 IABP +/- inotropes per AHF - Echocardiogram shows EF of 20 to 25% normal RV function - On digoxin  - Airway watch and usual post cath site checks - Patient currently is on room air - LFTs trending down post biliary drain we will continue to monitor - Patient started on dobutamine  2.5 mcg/kg/min - Amiodarone  is started for VT will continue - Awaiting further oncology recommendations further management of gastric cancer  Patient's family including both daughters were in the room I spoke with them  and updated them.  Labs   CBC: Recent Labs  Lab 05/01/24 0922 05/02/24 0020 05/02/24 0438 05/03/24 0745 05/03/24 1727 05/03/24 1751 05/04/24 0255  WBC 6.5 4.8 6.0 10.3  --   --  14.5*  NEUTROABS  --   --   --  8.4*  --   --   --   HGB 8.6* 9.3* 8.7* 10.4* 11.6* 11.6* 9.8*  HCT 28.6* 31.5* 29.3* 35.1* 34.0*  34.0* 32.7*  MCV 67.0* 67.0* 67.0* 68.8*  --   --  68.6*  PLT 449* 567* 479* 565*  --   --  566*    Basic Metabolic Panel: Recent Labs  Lab 05/01/24 0922 05/02/24 0020 05/02/24 0438 05/03/24 0745 05/03/24 1727 05/03/24 1751 05/04/24 0255  NA 136 136 135 133* 134* 134* 132*  K 4.3 4.1 4.1 4.5 4.0 3.9 3.7  CL 103 103 102 100  --   --  99  CO2 26 24 25 24   --   --  26  GLUCOSE 129* 140* 137* 122*  --   --  154*  BUN 11 13 12 8   --   --  17  CREATININE 0.64 0.70 0.62 0.59*  --   --  0.62  CALCIUM  8.0* 8.8* 8.2* 8.6*  --   --  7.7*  MG  --  2.2  --  2.3  --   --  2.0  PHOS  --   --   --   --   --   --  2.3*   GFR: Estimated Creatinine Clearance: 75.7 mL/min (by C-G formula based on SCr of 0.62 mg/dL). Recent Labs  Lab 05/02/24 0020 05/02/24 0438 05/03/24 0745 05/03/24 1726 05/03/24 2009 05/03/24 2323 05/04/24 0124 05/04/24 0255 05/04/24 0800  WBC 4.8 6.0 10.3  --   --   --   --  14.5*  --   LATICACIDVEN  --   --   --    < > 4.4* 2.7* 2.0*  --  1.5   < > = values in this interval not displayed.    Liver Function Tests: Recent Labs  Lab 05/01/24 0922 05/02/24 0438 05/03/24 0745 05/04/24 0255  AST 248* 187* 441* 260*  ALT 339* 311* 384* 298*  ALKPHOS 1,114* 1,039* 967* 749*  BILITOT 2.0* 1.3* 1.3* 0.9  PROT 5.0* 5.3* 5.6* 5.0*  ALBUMIN 2.8* 2.9* 2.9* 2.8*   No results for input(s): LIPASE, AMYLASE in the last 168 hours. No results for input(s): AMMONIA in the last 168 hours.  ABG    Component Value Date/Time   HCO3 23.7 05/03/2024 1751   TCO2 25 05/03/2024 1751   ACIDBASEDEF 1.0 05/03/2024 1751   O2SAT 59.1 05/04/2024 0255     Coagulation Profile: Recent Labs  Lab 05/01/24 0922  INR 1.0    Cardiac Enzymes: No results for input(s): CKTOTAL, CKMB, CKMBINDEX, TROPONINI in the last 168 hours.  HbA1C: Hgb A1c MFr Bld  Date/Time Value Ref Range Status  03/09/2024 12:01 PM 6.1 4.6 - 6.5 % Final    Comment:    Glycemic Control  Guidelines for People with Diabetes:Non Diabetic:  <6%Goal of Therapy: <7%Additional Action Suggested:  >8%   01/24/2023 02:35 PM 6.1 (H) 4.8 - 5.6 % Final    Comment:    (NOTE) Pre diabetes:          5.7%-6.4%  Diabetes:              >6.4%  Glycemic control for   <7.0%  adults with diabetes     CBG: Recent Labs  Lab 05/01/24 2249  GLUCAP 143*    Review of Systems:    Positive Symptoms in bold:  Constitutional fevers, chills, weight loss, fatigue, anorexia, malaise  Eyes decreased vision, double vision, eye irritation  Ears, Nose, Mouth, Throat sore throat, trouble swallowing, sinus congestion  Cardiovascular chest pain, paroxysmal nocturnal dyspnea, lower ext edema, palpitations   Respiratory SOB, cough, DOE, hemoptysis, wheezing  Gastrointestinal nausea, vomiting, diarrhea  Genitourinary burning with urination, trouble urinating  Musculoskeletal joint aches, joint swelling, back pain  Integumentary  rashes, skin lesions  Neurological focal weakness, focal numbness, trouble speaking, headaches  Psychiatric depression, anxiety, confusion  Endocrine polyuria, polydipsia, cold intolerance, heat intolerance  Hematologic abnormal bruising, abnormal bleeding, unexplained nose bleeds  Allergic/Immunologic recurrent infections, hives, swollen lymph nodes     Past Medical History:  He,  has a past medical history of Anemia, CAD (coronary artery disease), Hepatitis, History of kidney stones, Hyperlipidemia, Hypertension, Myocardial infarction (HCC), and Retinal detachment.   Surgical History:   Past Surgical History:  Procedure Laterality Date   BIOPSY OF SKIN SUBCUTANEOUS TISSUE AND/OR MUCOUS MEMBRANE  05/01/2024   Procedure: BIOPSY, SKIN, SUBCUTANEOUS TISSUE, OR MUCOUS MEMBRANE;  Surgeon: Wilhelmenia, [name], MD;  Location: [facility];  Service: Gastroenterology;;   CORONARY ANGIOGRAPHY N/A 05/03/2024   Procedure: CORONARY ANGIOGRAPHY;  Surgeon: Ladona Heinz, MD;   Location: [facility];  Service: Cardiovascular;  Laterality: N/A;   CORONARY STENT INTERVENTION N/A 07/22/2022   Procedure: CORONARY STENT INTERVENTION;  Surgeon: Verlin Lonni BIRCH, MD;  Location: [facility];  Service: Cardiovascular;  Laterality: N/A;   CORONARY STENT INTERVENTION N/A 05/03/2024   Procedure: CORONARY STENT INTERVENTION;  Surgeon: Ladona Heinz, MD;  Location: [facility];  Service: Cardiovascular;  Laterality: N/A;   CORONARY THROMBECTOMY N/A 05/03/2024   Procedure: Coronary Thrombectomy;  Surgeon: Ladona Heinz, MD;  Location: [facility]VASIVE CV LAB;  Service: Cardiovascular;  Laterality: N/A;   CORONARY/GRAFT ACUTE MI REVASCULARIZATION N/A 01/24/2023   Procedure: Coronary/Graft Acute MI Revascularization;  Surgeon: Verlin Lonni BIRCH, MD;  Location: [facility];  Service: Cardiovascular;  Laterality: N/A;   CORONARY/GRAFT ACUTE MI REVASCULARIZATION N/A 05/03/2024   Procedure: Coronary/Graft Acute MI Revascularization;  Surgeon: Ladona Heinz, MD;  Location: [facility];  Service: Cardiovascular;  Laterality: N/A;   ESOPHAGOGASTRODUODENOSCOPY N/A 05/01/2024   Procedure: EGD (ESOPHAGOGASTRODUODENOSCOPY);  Surgeon: Wilhelmenia [name], MD;  Location: [facility];  Service: Gastroenterology;  Laterality: N/A;   EUS N/A 05/01/2024   Procedure: ULTRASOUND, UPPER GI TRACT, ENDOSCOPIC;  Surgeon: Wilhelmenia [name], MD;  Location: [facility];  Service: Gastroenterology;  Laterality: N/A;   EYE SURGERY     IABP INSERTION N/A 05/03/2024   Procedure: IABP Insertion;  Surgeon: Ladona Heinz, MD;  Location: [facility];  Service: Cardiovascular;  Laterality: N/A;   IR BILIARY DRAIN PLACEMENT WITH CHOLANGIOGRAM  05/02/2024   LEFT HEART CATH AND CORONARY ANGIOGRAPHY N/A 07/22/2022   Procedure: LEFT HEART CATH AND CORONARY ANGIOGRAPHY;  Surgeon: Verlin Lonni BIRCH, MD;  Location: [facility];  Service: Cardiovascular;  Laterality: N/A;   LEFT  HEART CATH AND CORONARY ANGIOGRAPHY N/A 01/24/2023   Procedure: LEFT HEART CATH AND CORONARY ANGIOGRAPHY;  Surgeon: Verlin Lonni BIRCH, MD;  Location: [facility];  Service: Cardiovascular;  Laterality: N/A;   LEFT HEART CATH AND CORONARY ANGIOGRAPHY N/A 05/03/2024   Procedure: LEFT HEART CATH AND CORONARY ANGIOGRAPHY;  Surgeon: Ladona Heinz, MD;  Location: [facility] INVASIVE CV LAB;  Service: Cardiovascular;  Laterality: N/A;   RIGHT HEART CATH N/A 05/03/2024   Procedure: RIGHT HEART CATH;  Surgeon: Ladona Heinz, MD;  Location: [facility] INVASIVE CV LAB;  Service: Cardiovascular;  Laterality: N/A;     Social History:   reports that he has never smoked. He has never used smokeless tobacco. He reports that he does not currently use alcohol. He reports that he does not use drugs.   Family History:  His family history includes Heart attack (age of onset: 84) in his father; Heart disease in his mother; Hyperlipidemia in his mother; Hypertension in his mother.   Allergies Allergies[1]   Home Medications  Prior to Admission medications  Medication Sig Start Date End Date Taking? Authorizing Provider  aspirin  EC 81 MG tablet Take 1 tablet (81 mg total) by mouth daily. Swallow whole. 07/23/22  Yes Gonfa, Taye T, MD  atorvastatin  (LIPITOR) 80 MG tablet Take 1 tablet (80 mg total) by mouth at bedtime. 02/23/24  Yes Saguier, Dallas, PA-C  ezetimibe  (ZETIA ) 10 MG tablet TAKE 1 TABLET BY MOUTH EVERY DAY 02/22/24  Yes Court Dorn PARAS, MD  Iron , Ferrous Sulfate , 325 (65 Fe) MG TABS Take 1 tablet by mouth 3 (three) times daily. Patient taking differently: Take 1 tablet by mouth in the morning and at bedtime. 03/31/24  Yes Saguier, [place], PA-C  metoprolol  succinate (TOPROL -XL) 50 MG 24 hr tablet TAKE 1 TABLET (50 MG TOTAL) BY MOUTH DAILY. NEEDS APPT 11/23/23  Yes Saguier, [place], PA-C  ondansetron  (ZOFRAN ) 4 MG tablet Take 1 tablet (4 mg total) by mouth every 8 (eight) hours as needed for nausea or vomiting. 03/30/24   Yes Saguier, [place], PA-C  prasugrel  (EFFIENT ) 10 MG TABS tablet Take 1 tablet (10 mg total) by mouth daily. 01/11/24  Yes Court Dorn PARAS, MD  prednisoLONE acetate (PRED FORTE) 1 % ophthalmic suspension Place 1 drop into the left eye daily.   Yes [provider]  SKYRIZI PEN 150 MG/ML pen Inject into the skin every 3 (three) months. 1 dose every 3 months 02/24/24  Yes [provider]  telmisartan  (MICARDIS ) 40 MG tablet TAKE 1 TABLET BY MOUTH EVERY DAY 04/04/24  Yes Saguier, Dallas, PA-C  triamcinolone  cream (KENALOG ) 0.1 % Apply 1 Application topically 2 (two) times daily. Patient taking differently: Apply 1 Application topically daily as needed (irritation). 11/11/21  Yes Saguier, Dallas, PA-C  sodium polystyrene (KAYEXALATE ) 15 GM/60ML suspension Take 60 ml by mouth daily for 4 days Patient not taking: Reported on 05/01/2024 03/11/24   Dorina [place], PA-C     Critical care time: 35 mins independent of procedures      Tamela Stakes, MD  Attending Physician, Critical Care Medicine [facility] See Amion for pager If no response to pager, please call [phone] until 7pm After 7pm, Please call E-link [phone]          [1]  Allergies Allergen Reactions   Lisinopril Cough   Nitroglycerin  Other (See Comments)    Blood pressure drop   Penicillins Rash   "

## 2024-05-04 NOTE — Progress Notes (Addendum)
 Nighttime cross coverage  Interim History / Subjective:  Stable on IABP Weaning norepinephrine  down to 10 mics per minute Still on vasopressin  at 0.04 Denies chest pain or shortness of breath CVP 7 cardiac index 1.75 Co. oximetry 55  Objective    Blood pressure 118/86, pulse 83, temperature 99.9 F (37.7 C), resp. rate 20, height 5' 7 (1.702 m), weight 68.4 kg, SpO2 98%. PAP: (30-41)/(13-22) 33/14 CVP:  [6 mmHg-11 mmHg] 7 mmHg PCWP:  [19 mmHg] 19 mmHg CO:  [3.5 L/min] 3.5 L/min CI:  [1.75 L/min/m2] 1.75 L/min/m2      Intake/Output Summary (Last 24 hours) at 05/04/2024 0037 Last data filed at 05/04/2024 0001 Gross per 24 hour  Intake 3848.26 ml  Output 3125 ml  Net 723.26 ml   Filed Weights   05/01/24 0659 05/02/24 1121  Weight: 66.7 kg 68.4 kg    Examination: General Pleasant 74 year old primary Spanish-speaking male patient resting in bed he is in no acute distress HEENT normal cephalic atraumatic no jugular venous distention appreciated.  Actually his jugular veins are quite flat, had to place him in legs up position for several minutes in order to place central line Pulmonary clear to auscultation currently on room air Cardiac regular rate and rhythm without murmur rub or gallop Abdomen is soft nontender biliary drain with bilious output Extremities are warm and dry, IABP insertion site site right groin, also has right PAC in this place dressing is clean dry and intact Neuro awake and oriented GU clear yellow  Resolved problem list   Assessment and Plan   NSTEMI s/p LAD stenting early afternoon 1/7 c/b in-stent thrombosis manifesting as VT storm later afternoon 1/7 complicated by post cath cardioplegia/ cardiogenic shock and lactic acidosis I think his lactate is just slow to clear due to the hepatic involvement Plan Continue IABP with one-to-one support Continue to wean norepinephrine  for mean arterial pressure greater than 65 Holding vasopressin  at current  0.04 Based on reduced cardiac index will start low-dose dobutamine  per discussion with HF Follow-up hemodynamic measurements post dobutamine  with follow-up lactate and goal ox Keep euvolemic Continue postcardiac cath antiplatelet therapy with aspirin  and Aggrastat  overnight, with plan to continue dual antiplatelet therapy indefinitely Holding statin given shock liver Continue telemetry monitoring Continue amiodarone  for ventricular tachycardia Holding antihypertensives Deemed not a candidate for mechanical support or advanced therapies given new diagnosis of malignancy   New diagnosis of 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- s/p IR guided biliary drain before LHC above, onc consulted Plan Continue to monitor biliary output  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

## 2024-05-04 NOTE — Progress Notes (Signed)
 PHARMACY - ANTICOAGULATION  Pharmacy Consult for Heparin   Indication: IABP  Allergies[1]  Patient Measurements: Height: 5' 7 (170.2 cm) Weight: 68.4 kg (150 lb 12.7 oz) IBW/kg (Calculated) : 66.1 HEPARIN  DW (KG): 68.4  Vital Signs: Temp: 99.1 F (37.3 C) (01/08 1200) Temp Source: Core (01/08 1200) Pulse Rate: 82 (01/08 0905)  Labs: Recent Labs    05/02/24 0438 05/03/24 0745 05/03/24 1727 05/03/24 1751 05/04/24 0255 05/04/24 1116  HGB 8.7* 10.4* 11.6* 11.6* 9.8*  --   HCT 29.3* 35.1* 34.0* 34.0* 32.7*  --   PLT 479* 565*  --   --  566*  --   HEPARINUNFRC  --   --   --   --  0.28* <0.10*  CREATININE 0.62 0.59*  --   --  0.62  --     Estimated Creatinine Clearance: 75.7 mL/min (by C-G formula based on SCr of 0.62 mg/dL).   Assessment: 74 y.o. male with NSTEMI s/p LAD stenting in 1/7, then in-stent thrombosis manifesting as VT storm later afternoon 1/7, complicated by cardiogenic shock and lactic acidosis prompting IABP placement. Pharmacy is consulted for heparin  for IABP.   Patient is additionally on Aggrastat  gtt ending at 1300, ASA, and Effient  daily starting at 1300. IABP at 1:1. Heparin  level subtherapeutic at <0.1 on 700 units/hr drawn by nurse via A-line. No infusion issues or bleeding concerns per RN. Hgb down from 10.4 to 9.8 today. PLT elevated in 500s.   Goal of Therapy:  Heparin  level 0.2-0.5 units/ml Monitor platelets by anticoagulation protocol: Yes   Plan:  Increase heparin  gtt to 800 units/hr  Draw Anti-Xa level in 6 hours Daily Anti-Xa and CBC   Bryan Craig 05/04/2024,12:31 PM        [1]  Allergies Allergen Reactions   Lisinopril Cough   Nitroglycerin  Other (See Comments)    Blood pressure drop   Penicillins Rash

## 2024-05-04 NOTE — Plan of Care (Signed)
" °  Problem: Respiratory: Goal: Will regain and/or maintain adequate ventilation Outcome: Progressing Goal: Respiratory status will improve Outcome: Progressing   Problem: Skin Integrity: Goal: Demonstrates signs of wound healing without infection Outcome: Progressing   Problem: Urinary Elimination: Goal: Will remain free from infection Outcome: Progressing Goal: Ability to achieve and maintain adequate urine output Outcome: Progressing   Problem: Coping: Goal: Level of anxiety will decrease Outcome: Progressing   "

## 2024-05-04 NOTE — Progress Notes (Signed)
 PHARMACY - ANTICOAGULATION  Pharmacy Consult for Heparin   Indication: IABP Brief A/P:  Heparin  level within goal range Continue Heparin  at current rate   Allergies[1]  Patient Measurements: Height: 5' 7 (170.2 cm) Weight: 68.4 kg (150 lb 12.7 oz) IBW/kg (Calculated) : 66.1 HEPARIN  DW (KG): 68.4  Vital Signs: Temp: 99.7 F (37.6 C) (01/08 0330) Temp Source: Oral (01/07 2327) BP: 118/86 (01/07 2115) Pulse Rate: 217 (01/08 0330)  Labs: Recent Labs    05/01/24 9077 05/02/24 0020 05/02/24 0438 05/03/24 0745 05/03/24 1727 05/03/24 1751 05/04/24 0255  HGB 8.6*   < > 8.7* 10.4* 11.6* 11.6* 9.8*  HCT 28.6*   < > 29.3* 35.1* 34.0* 34.0* 32.7*  PLT 449*   < > 479* 565*  --   --  566*  LABPROT 13.8  --   --   --   --   --   --   INR 1.0  --   --   --   --   --   --   HEPARINUNFRC  --   --   --   --   --   --  0.28*  CREATININE 0.64   < > 0.62 0.59*  --   --  0.62   < > = values in this interval not displayed.    Estimated Creatinine Clearance: 75.7 mL/min (by C-G formula based on SCr of 0.62 mg/dL).   Assessment: 74 y.o. male s/p PCI 1/7 then PTCA/thrombectomy and IABP for heparin   Goal of Therapy:  Heparin  level 0.2-0.5 units/ml Monitor platelets by anticoagulation protocol: Yes   Plan:  No change to heparin  for now  Dail Cordella Misty 05/04/2024,3:41 AM       [1]  Allergies Allergen Reactions   Lisinopril Cough   Nitroglycerin  Other (See Comments)    Blood pressure drop   Penicillins Rash

## 2024-05-04 NOTE — Progress Notes (Signed)
 Patient ID: Bryan Craig, male   DOB: 1951-02-27, 74 y.o.   MRN: [id number]     Advanced Heart Failure Rounding Note  Cardiologist: Dorn Lesches, MD  [facility] Cardiologist: Rolan Chief Complaint: Cardiogenic shock Patient Profile   Bryan Craig is a 74 y.o. male with gastric cancer, bile and pancreatic duct obstruction, and CAD s/p MI with cardiogenic shock   Subjective:    Patient is on dobutamine  2.5, NE 8, vasopressin  0.02, and IABP 1:1.  Stable MAP. Creatinine 0.62.  LFTs remain elevated.   On amiodarone  gtt 30 mg/hr, no further arrhythmias.   Patient denies chest pain or dyspnea.   Swan: CVP < 5 PA 23/4 CI 2.7 Co-ox 59% Lactate 4.4 => 2.7 => 2   Objective:   Weight Range: 68.4 kg Body mass index is 23.62 kg/m.   Vital Signs:   Temp:  [95.4 F (35.2 C)-100 F (37.8 C)] 99.5 F (37.5 C) (01/08 0700) Pulse Rate:  [69-260] 82 (01/08 0700) Resp:  [14-25] 18 (01/08 0700) BP: (66-139)/(46-104) 118/86 (01/07 2115) SpO2:  [67 %-100 %] 98 % (01/08 0700) Arterial Line BP: (95-139)/(40-72) 119/49 (01/08 0700) Last BM Date : 04/30/24  Weight change: Filed Weights   05/01/24 0659 05/02/24 1121  Weight: 66.7 kg 68.4 kg    Intake/Output:   Intake/Output Summary (Last 24 hours) at 05/04/2024 0728 Last data filed at 05/04/2024 0700 Gross per 24 hour  Intake 3634.57 ml  Output 1930 ml  Net 1704.57 ml     Physical Exam   General: NAD Neck: No JVD, no thyromegaly or thyroid  nodule.  Lungs: Clear to auscultation bilaterally with normal respiratory effort. CV: Nondisplaced PMI.  Heart regular S1/S2, IABP sounds  No peripheral edema.   Abdomen: Soft, nontender, no hepatosplenomegaly, no distention.  Skin: Intact without lesions or rashes.  Neurologic: Alert and oriented x 3.  Psych: Normal affect. Extremities: No clubbing or cyanosis.  HEENT: Normal.   Telemetry   NSR, occasional PVCs (personally reviewed)  Labs   CBC Recent Labs    05/03/24 0745  05/03/24 1727 05/03/24 1751 05/04/24 0255  WBC 10.3  --   --  14.5*  NEUTROABS 8.4*  --   --   --   HGB 10.4*   < > 11.6* 9.8*  HCT 35.1*   < > 34.0* 32.7*  MCV 68.8*  --   --  68.6*  PLT 565*  --   --  566*   < > = values in this interval not displayed.   Basic Metabolic Panel Recent Labs    98/92/73 0745 05/03/24 1727 05/03/24 1751 05/04/24 0255  NA 133*   < > 134* 132*  K 4.5   < > 3.9 3.7  CL 100  --   --  99  CO2 24  --   --  26  GLUCOSE 122*  --   --  154*  BUN 8  --   --  17  CREATININE 0.59*  --   --  0.62  CALCIUM  8.6*  --   --  7.7*  MG 2.3  --   --  2.0  PHOS  --   --   --  2.3*   < > = values in this interval not displayed.   Liver Function Tests Recent Labs    05/03/24 0745 05/04/24 0255  AST 441* 260*  ALT 384* 298*  ALKPHOS 967* 749*  BILITOT 1.3* 0.9  PROT 5.6* 5.0*  ALBUMIN 2.9* 2.8*   No  results for input(s): LIPASE, AMYLASE in the last 72 hours. Cardiac Enzymes No results for input(s): CKTOTAL, CKMB, CKMBINDEX, TROPONINI in the last 72 hours.  BNP: BNP (last 3 results) No results for input(s): BNP in the last 8760 hours.  ProBNP (last 3 results) No results for input(s): PROBNP in the last 8760 hours.   D-Dimer No results for input(s): DDIMER in the last 72 hours. Hemoglobin A1C No results for input(s): HGBA1C in the last 72 hours. Fasting Lipid Panel No results for input(s): CHOL, HDL, LDLCALC, TRIG, CHOLHDL, LDLDIRECT in the last 72 hours. Medications:   Scheduled Medications:  aspirin  EC  81 mg Oral Daily   Chlorhexidine  Gluconate Cloth  6 each Topical Daily   lidocaine -EPINEPHrine   20 mL Intradermal Once   pantoprazole  (PROTONIX ) IV  40 mg Intravenous Daily   prasugrel   10 mg Oral Daily   sodium chloride  flush  3 mL Intravenous Q12H   sodium chloride  flush  3 mL Intravenous Q12H   sodium chloride  flush  5 mL Intracatheter Q8H    Infusions:  sodium chloride      sodium chloride       amiodarone  30 mg/hr (05/04/24 0600)   DOBUTamine  2.5 mcg/kg/min (05/04/24 0600)   heparin  700 Units/hr (05/04/24 0600)   norepinephrine  (LEVOPHED ) Adult infusion 7 mcg/min (05/04/24 0600)   sodium PHOSPHATE  IVPB (in mmol) 15 mmol (05/04/24 0646)   tirofiban  0.15 mcg/kg/min (05/04/24 0606)   vasopressin  0.02 Units/min (05/04/24 0600)    PRN Medications: sodium chloride , sodium chloride , albuterol , HYDROmorphone  (DILAUDID ) injection, iohexol , ondansetron  **OR** ondansetron  (ZOFRAN ) IV, mouth rinse, oxyCODONE , simethicone , sodium chloride  flush, sodium chloride  flush, traZODone   Assessment/Plan   1. CAD: Patient has history of ramus PCI and prior stent thrombosis, while on ticagrelor .  Was switched to Effient , this was stopped 12/30 for EGD and biopsy.  NSTEMI initially this admission.  Cath 1/7 in setting of NSTEMI showed occluded ramus and 95% proximal LAD stenosis, LAD treated with DES.  On floor after cath, patient had recurrent chest pain and anterolateral STE.  He was returned to cath lab, noted to have thrombotic occlusion of fresh stent, treated with PTCA and Aggrastat  with restoration of TIMI3 flow.  He will be at significant risk for future thrombotic events in setting of gastric cancer.  No further chest pain.  - Aggrastat  gtt to complete around noon, will get Effient  around that time.  - Effient /ASA to continue, will have to remain on DAPT indefinitely.  - Statin on hold with elevated LFTs, restart tomorrow if trending down.  2. Cardiogenic shock: Ischemic cardiomyopathy, initial echo with EF 40-45%, mild-moderate MR.  Lactate to 4.4 in setting of stent thrombosis.  Dr. Ladona placed IABP prior to PTCA LAD.  Now on dobutamine  2.5, NE 8, and vasopressin  0.02 with CI 2.5 thermo and co-ox 59%.  Last lactate down to 2.  Creatinine stable, CVP < 5.  - Can wean vasopressin  then NE this morning. - Continue dobutamine  at 2.5.    - Continue IABP 1:1, heparin  gtt for IABP.  - Check lactate this  morning.  - Swan to stay in place for now.  - Add digoxin  0.125 daily.  - He will not be candidate for advanced therapies, ECMO, etc with newly discovered advanced gastric cancer.  3. VT: In setting of stent thrombosis.  - Continue amiodarone  gtt for now.  4. Gastric cancer: Associated with biliary and pancreatic duct obstruction.  New diagnosis.  He has a new percutaneous transhepatic biliary drain. We await formal  oncology evaluation but cancer seems advanced.  - Await full oncology evaluation for prognosis.   Discussed at bedside with his daughter.   CRITICAL CARE Performed by: Ezra Shuck  Total critical care time: 45 minutes  Critical care time was exclusive of separately billable procedures and treating other patients.  Critical care was necessary to treat or prevent imminent or life-threatening deterioration.  Critical care was time spent personally by me on the following activities: development of treatment plan with patient and/or surrogate as well as nursing, discussions with consultants, evaluation of patient's response to treatment, examination of patient, obtaining history from patient or surrogate, ordering and performing treatments and interventions, ordering and review of laboratory studies, ordering and review of radiographic studies, pulse oximetry and re-evaluation of patient's condition.   Length of Stay: 3  Ezra Shuck, MD  05/04/2024, 7:28 AM  Advanced Heart Failure Team Pager [phone] (M-F; 7a - 5p)   Please visit [email or web address]: For overnight coverage please call cardiology fellow first. If fellow not available call Shock/ECMO MD on call.  For ECMO / Mechanical Support (Impella, IABP, LVAD) issues call Shock / ECMO MD on call.

## 2024-05-04 NOTE — Progress Notes (Signed)
 Heart Failure Navigator Progress Note  Assessed for Heart & Vascular TOC clinic readiness.  Patient does not meet criteria due to Advanced Heart Failure Team consulted..   Navigator will sign off at this time.    Stephane Haddock, BSN, Scientist, clinical (histocompatibility and immunogenetics) Only

## 2024-05-04 NOTE — Procedures (Signed)
 Arterial Catheter Insertion Procedure Note  Bryan Craig  [id number]  01/08/1951  Date:05/04/2024  Time:12:52 AM    Provider Performing: Jeralyn FORBES Banner    Procedure: Insertion of Arterial Line (63379) with US  guidance (23062)   Indication(s) Blood pressure monitoring and/or need for frequent ABGs  Consent Unable to obtain consent due to emergent nature of procedure.  Anesthesia None   Time Out Verified patient identification, verified procedure, site/side was marked, verified correct patient position, special equipment/implants available, medications/allergies/relevant history reviewed, required imaging and test results available.   Sterile Technique Maximal sterile technique including full sterile barrier drape, hand hygiene, sterile gown, sterile gloves, mask, hair covering, sterile ultrasound probe cover (if used).   Procedure Description Area of catheter insertion was cleaned with chlorhexidine  and draped in sterile fashion. With real-time ultrasound guidance an arterial catheter was placed into the left radial artery.  Appropriate arterial tracings confirmed on monitor.     Complications/Tolerance None; patient tolerated the procedure well.   EBL Minimal   Specimen(s) None

## 2024-05-04 NOTE — Progress Notes (Signed)
 PHARMACY - ANTICOAGULATION  Pharmacy Consult for Heparin   Indication: IABP  Allergies[1]  Patient Measurements: Height: 5' 7 (170.2 cm) Weight: 68.4 kg (150 lb 12.7 oz) IBW/kg (Calculated) : 66.1 HEPARIN  DW (KG): 68.4  Vital Signs: Temp: 99.5 F (37.5 C) (01/08 1930) Temp Source: Core (01/08 2000) Pulse Rate: 82 (01/08 0905)  Labs: Recent Labs    05/02/24 0438 05/03/24 0745 05/03/24 1727 05/03/24 1751 05/04/24 0255 05/04/24 0255 05/04/24 1116 05/04/24 1841 05/04/24 1957  HGB 8.7* 10.4* 11.6* 11.6* 9.8*  --   --   --   --   HCT 29.3* 35.1* 34.0* 34.0* 32.7*  --   --   --   --   PLT 479* 565*  --   --  566*  --   --   --   --   HEPARINUNFRC  --   --   --   --  0.28*   < > <0.10* <0.10* <0.10*  CREATININE 0.62 0.59*  --   --  0.62  --   --   --   --    < > = values in this interval not displayed.    Estimated Creatinine Clearance: 75.7 mL/min (by C-G formula based on SCr of 0.62 mg/dL).   Assessment: 74 y.o. male with NSTEMI s/p LAD stenting in 1/7, then in-stent thrombosis manifesting as VT storm later afternoon 1/7, complicated by cardiogenic shock and lactic acidosis prompting IABP placement. Pharmacy is consulted for heparin  for IABP.   IABP at 1:1.  Heparin  level subtherapeutic at <0.1 on 800 units/hr. No infusion issues or bleeding concerns per RN.   Goal of Therapy:  Heparin  level 0.2-0.5 units/ml Monitor platelets by anticoagulation protocol: Yes   Plan:  Increase heparin  gtt to 900 units/hr  Draw Anti-Xa level with AM labs. Daily Anti-Xa and CBC   Larraine Brazier, PharmD Clinical Pharmacist 05/04/2024  8:29 PM **Pharmacist phone directory can now be found on [email or web address] ([email or web address]).  Listed under [facility].         [1]  Allergies Allergen Reactions   Lisinopril Cough   Nitroglycerin  Other (See Comments)    Blood pressure drop   Penicillins Rash

## 2024-05-05 DIAGNOSIS — R570 Cardiogenic shock: Secondary | ICD-10-CM

## 2024-05-05 DIAGNOSIS — I214 Non-ST elevation (NSTEMI) myocardial infarction: Secondary | ICD-10-CM

## 2024-05-05 DIAGNOSIS — I472 Ventricular tachycardia, unspecified: Secondary | ICD-10-CM

## 2024-05-05 DIAGNOSIS — E8729 Other acidosis: Secondary | ICD-10-CM

## 2024-05-05 DIAGNOSIS — E871 Hypo-osmolality and hyponatremia: Secondary | ICD-10-CM

## 2024-05-05 DIAGNOSIS — C169 Malignant neoplasm of stomach, unspecified: Secondary | ICD-10-CM

## 2024-05-05 DIAGNOSIS — D649 Anemia, unspecified: Secondary | ICD-10-CM

## 2024-05-05 DIAGNOSIS — Z7189 Other specified counseling: Secondary | ICD-10-CM

## 2024-05-05 LAB — COOXEMETRY PANEL
Carboxyhemoglobin: 2.2 % — ABNORMAL HIGH (ref 0.5–1.5)
Carboxyhemoglobin: 2.2 % — ABNORMAL HIGH (ref 0.5–1.5)
Carboxyhemoglobin: 1.3 % (ref 0.5–1.5)
Methemoglobin: <0.7 % (ref 0.0–1.5)
Methemoglobin: 1 % (ref 0.0–1.5)
Methemoglobin: <0.7 % (ref 0.0–1.5)
O2 Saturation: 73 %
O2 Saturation: 71.8 %
O2 Saturation: 65.7 %
Total hemoglobin: 9.2 g/dL — ABNORMAL LOW (ref 12.0–16.0)
Total hemoglobin: 9 g/dL — ABNORMAL LOW (ref 12.0–16.0)
Total hemoglobin: 9.1 g/dL — ABNORMAL LOW (ref 12.0–16.0)

## 2024-05-05 LAB — CBC
HCT: 29.7 % — ABNORMAL LOW (ref 39.0–52.0)
Hemoglobin: 8.8 g/dL — ABNORMAL LOW (ref 13.0–17.0)
MCH: 20.6 pg — ABNORMAL LOW (ref 26.0–34.0)
MCHC: 29.6 g/dL — ABNORMAL LOW (ref 30.0–36.0)
MCV: 69.6 fL — ABNORMAL LOW (ref 80.0–100.0)
Platelets: 350 K/uL (ref 150–400)
RBC: 4.27 MIL/uL (ref 4.22–5.81)
RDW: 24.7 % — ABNORMAL HIGH (ref 11.5–15.5)
WBC: 12 K/uL — ABNORMAL HIGH (ref 4.0–10.5)
nRBC: 0 % (ref 0.0–0.2)

## 2024-05-05 LAB — HEPARIN LEVEL (UNFRACTIONATED): Heparin Unfractionated: <0.1 [IU]/mL — ABNORMAL LOW (ref 0.30–0.70)

## 2024-05-05 LAB — AEROBIC/ANAEROBIC CULTURE W GRAM STAIN (SURGICAL/DEEP WOUND): Gram Stain: NONE SEEN

## 2024-05-05 LAB — COMPREHENSIVE METABOLIC PANEL WITH GFR
ALT: 186 U/L — ABNORMAL HIGH (ref 0–44)
AST: 109 U/L — ABNORMAL HIGH (ref 15–41)
Albumin: 2.5 g/dL — ABNORMAL LOW (ref 3.5–5.0)
Alkaline Phosphatase: 554 U/L — ABNORMAL HIGH (ref 38–126)
Anion gap: 8 (ref 5–15)
BUN: 15 mg/dL (ref 8–23)
CO2: 25 mmol/L (ref 22–32)
Calcium: 7.7 mg/dL — ABNORMAL LOW (ref 8.9–10.3)
Chloride: 99 mmol/L (ref 98–111)
Creatinine, Ser: 0.49 mg/dL — ABNORMAL LOW (ref 0.61–1.24)
GFR, Estimated: >60 mL/min
Glucose, Bld: 102 mg/dL — ABNORMAL HIGH (ref 70–99)
Potassium: 3.3 mmol/L — ABNORMAL LOW (ref 3.5–5.1)
Sodium: 132 mmol/L — ABNORMAL LOW (ref 135–145)
Total Bilirubin: 0.7 mg/dL (ref 0.0–1.2)
Total Protein: 4.8 g/dL — ABNORMAL LOW (ref 6.5–8.1)

## 2024-05-05 LAB — MAGNESIUM: Magnesium: 2.3 mg/dL (ref 1.7–2.4)

## 2024-05-05 LAB — CEA: CEA: 0.8 ng/mL (ref 0.0–4.7)

## 2024-05-05 LAB — PHOSPHORUS: Phosphorus: 2.1 mg/dL — ABNORMAL LOW (ref 2.5–4.6)

## 2024-05-05 LAB — POCT ACTIVATED CLOTTING TIME: Activated Clotting Time: 138 s

## 2024-05-05 MED ORDER — POTASSIUM PHOSPHATES 15 MMOLE/5ML IV SOLN
15.0000 mmol | Freq: Once | INTRAVENOUS | Status: AC
  Administered 2024-05-05: 15 mmol via INTRAVENOUS
  Filled 2024-05-05: qty 5

## 2024-05-05 MED ORDER — ATORVASTATIN CALCIUM 80 MG PO TABS
80.0000 mg | ORAL_TABLET | Freq: Every day | ORAL | Status: DC
  Administered 2024-05-05 – 2024-05-07 (×3): 80 mg via ORAL
  Filled 2024-05-05 (×3): qty 1

## 2024-05-05 MED ORDER — POTASSIUM CHLORIDE CRYS ER 20 MEQ PO TBCR
40.0000 meq | EXTENDED_RELEASE_TABLET | Freq: Once | ORAL | Status: AC
  Administered 2024-05-05: 40 meq via ORAL
  Filled 2024-05-05: qty 2

## 2024-05-05 NOTE — Plan of Care (Signed)
" °  Problem: Cardiac: Goal: Ability to achieve and maintain adequate cardiopulmonary perfusion will improve Outcome: Progressing Goal: Vascular access site(s) Level 0-1 will be maintained Outcome: Progressing   Problem: Physical Regulation: Goal: Complications related to the disease process, condition or treatment will be avoided or minimized Outcome: Progressing   "

## 2024-05-05 NOTE — Plan of Care (Signed)
   Problem: Coping: Goal: Level of anxiety will decrease Outcome: Progressing

## 2024-05-05 NOTE — Progress Notes (Signed)
 PHARMACY - ANTICOAGULATION  Pharmacy Consult for Heparin   Indication: IABP  Allergies[1]  Patient Measurements: Height: 5' 7 (170.2 cm) Weight: (P) 67.6 kg (149 lb 0.5 oz) IBW/kg (Calculated) : 66.1 HEPARIN  DW (KG): 68.4  Vital Signs: Temp: 99 F (37.2 C) (01/09 0715) Temp Source: Core (01/09 0400)  Labs: Recent Labs    05/03/24 0745 05/03/24 1727 05/03/24 1751 05/04/24 0255 05/04/24 1116 05/04/24 1841 05/04/24 1957 05/05/24 0457  HGB 10.4*   < > 11.6* 9.8*  --   --   --  8.8*  HCT 35.1*   < > 34.0* 32.7*  --   --   --  29.7*  PLT 565*  --   --  566*  --   --   --  350  HEPARINUNFRC  --   --   --  0.28*   < > <0.10* <0.10* <0.10*  CREATININE 0.59*  --   --  0.62  --   --   --  0.49*   < > = values in this interval not displayed.    Estimated Creatinine Clearance: 75.7 mL/min (A) (by C-G formula based on SCr of 0.49 mg/dL (L)).   Assessment: 74 y.o. male with NSTEMI s/p LAD stenting in 1/7, then in-stent thrombosis manifesting as VT storm later afternoon 1/7, complicated by cardiogenic shock and lactic acidosis prompting IABP placement. Pharmacy is consulted for heparin  for IABP.   IABP -no issues overnight. Heparin  level remains subtherapeutic at <0.1, on 900 units/hr. No infusion issues or s/sx of bleeding per nursing. Hgb 8.8, plt 350.   Goal of Therapy:  Heparin  level 0.2-0.5 units/ml Monitor platelets by anticoagulation protocol: Yes   Plan:  Increase heparin  gtt to 1050 units/hr  Order Anti-Xa level in 6 hours Monitor daily Anti-Xa and CBC, s/sx of bleeding   ADDENDUM Plan to remove IABP - given no indication of anticoagulation outside of IABP, will discontinue heparin  after IABP removed and consider DVT prophylaxis.  Thank you for allowing pharmacy to participate in this patient's care,  Suzen Sour, PharmD, BCCCP Clinical Pharmacist  Phone: [phone] 05/05/2024 7:31 AM  Please check AMION for all [facility] phone numbers After 10:00 PM, call  Main Pharmacy [phone]      [1]  Allergies Allergen Reactions   Lisinopril Cough   Nitroglycerin  Other (See Comments)    Blood pressure drop   Penicillins Rash

## 2024-05-05 NOTE — Progress Notes (Signed)
 Nutrition Brief Note  Chart reviewed. Palliative Care consult. Pt now DNR. Post discussion with Oncology, family wanting to transition to hospice facility if possible and want to avoid any interventions that would cause suffer. IABP removal today, planning to wean DBA  Liberalized diet to Regular, receiving Ensure Plus HP. Ok to discontinue Ensure if pt does not want  No further nutrition interventions planned at this time.  Please re-consult as needed.   Betsey Finger MS, RDN, LDN, CNSC Registered Dietitian 3 Clinical Nutrition RD Inpatient Contact Info in Amion

## 2024-05-05 NOTE — Progress Notes (Signed)
 Patient ID: Bryan Craig, male   DOB: 23-Nov-1950, 74 y.o.   MRN: [id number]     Advanced Heart Failure Rounding Note  Cardiologist: Dorn Lesches, MD  [facility] Cardiologist: Rolan Chief Complaint: Cardiogenic shock Patient Profile   Mckale Haffey is a 74 y.o. male with gastric cancer, bile and pancreatic duct obstruction, and CAD s/p MI with cardiogenic shock   Subjective:    Patient is on dobutamine  2.5 and IABP 1:1.  Stable MAP. Creatinine 0.49.  LFTs remain elevated but trending down.    On amiodarone  gtt 30 mg/hr, no further arrhythmias.   Patient denies chest pain or dyspnea.   Swan: CVP 2 PA 28/11 CI 2.9 Co-ox 73% Lactate 4.4 => 2.7 => 2 => 1.5    Objective:   Weight Range: (P) 67.6 kg Body mass index is 23.34 kg/m (pended).   Vital Signs:   Temp:  [98.8 F (37.1 C)-99.5 F (37.5 C)] 98.8 F (37.1 C) (01/09 0730) Pulse Rate:  [82] 82 (01/08 0905) Resp:  [13-26] 19 (01/09 0730) SpO2:  [85 %-98 %] 97 % (01/09 0730) Arterial Line BP: (75-153)/(33-75) 104/52 (01/09 0730) Weight:  [67.6 kg] (P) 67.6 kg (01/09 0630) Last BM Date :  (PTA)  Weight change: Filed Weights   05/01/24 0659 05/02/24 1121 05/05/24 0630  Weight: 66.7 kg 68.4 kg (P) 67.6 kg    Intake/Output:   Intake/Output Summary (Last 24 hours) at 05/05/2024 0749 Last data filed at 05/05/2024 0710 Gross per 24 hour  Intake 1968.6 ml  Output 1345 ml  Net 623.6 ml     Physical Exam   General: NAD Neck: No JVD, no thyromegaly or thyroid  nodule.  Lungs: Clear to auscultation bilaterally with normal respiratory effort. CV: Nondisplaced PMI.  Heart regular S1/S2, no S3/S4, IABP sounds.  No peripheral edema.   Abdomen: Soft, nontender, no hepatosplenomegaly, no distention.  Skin: Intact without lesions or rashes.  Neurologic: Alert and oriented x 3.  Psych: Normal affect. Extremities: No clubbing or cyanosis.  HEENT: Normal.   Telemetry   NSR, occasional PVCs (personally reviewed)  Labs    CBC Recent Labs    05/03/24 0745 05/03/24 1727 05/04/24 0255 05/05/24 0457  WBC 10.3  --  14.5* 12.0*  NEUTROABS 8.4*  --   --   --   HGB 10.4*   < > 9.8* 8.8*  HCT 35.1*   < > 32.7* 29.7*  MCV 68.8*  --  68.6* 69.6*  PLT 565*  --  566* 350   < > = values in this interval not displayed.   Basic Metabolic Panel Recent Labs    98/91/73 0255 05/05/24 0457  NA 132* 132*  K 3.7 3.3*  CL 99 99  CO2 26 25  GLUCOSE 154* 102*  BUN 17 15  CREATININE 0.62 0.49*  CALCIUM  7.7* 7.7*  MG 2.0 2.3  PHOS 2.3* 2.1*   Liver Function Tests Recent Labs    05/04/24 0255 05/05/24 0457  AST 260* 109*  ALT 298* 186*  ALKPHOS 749* 554*  BILITOT 0.9 0.7  PROT 5.0* 4.8*  ALBUMIN 2.8* 2.5*   No results for input(s): LIPASE, AMYLASE in the last 72 hours. Cardiac Enzymes No results for input(s): CKTOTAL, CKMB, CKMBINDEX, TROPONINI in the last 72 hours.  BNP: BNP (last 3 results) No results for input(s): BNP in the last 8760 hours.  ProBNP (last 3 results) No results for input(s): PROBNP in the last 8760 hours.   D-Dimer No results for input(s): DDIMER in  the last 72 hours. Hemoglobin A1C No results for input(s): HGBA1C in the last 72 hours. Fasting Lipid Panel No results for input(s): CHOL, HDL, LDLCALC, TRIG, CHOLHDL, LDLDIRECT in the last 72 hours. Medications:   Scheduled Medications:  aspirin  EC  81 mg Oral Daily   Chlorhexidine  Gluconate Cloth  6 each Topical Daily   digoxin   0.125 mg Oral Daily   feeding supplement  237 mL Oral TID BM   pantoprazole  (PROTONIX ) IV  40 mg Intravenous Daily   potassium chloride   40 mEq Oral Once   prasugrel   10 mg Oral Daily   sodium chloride  flush  3 mL Intravenous Q12H   sodium chloride  flush  5 mL Intracatheter Q8H    Infusions:  amiodarone  30 mg/hr (05/05/24 0700)   DOBUTamine  2.5 mcg/kg/min (05/05/24 0700)   heparin  1,050 Units/hr (05/05/24 0728)   norepinephrine  (LEVOPHED ) Adult infusion Stopped  (05/05/24 0459)   potassium PHOSPHATE  IVPB (in mmol)     vasopressin  Stopped (05/04/24 0842)    PRN Medications: albuterol , HYDROmorphone  (DILAUDID ) injection, iohexol , ondansetron  **OR** ondansetron  (ZOFRAN ) IV, mouth rinse, oxyCODONE , simethicone , sodium chloride  flush, sodium chloride  flush, traZODone   Assessment/Plan   1. CAD: Patient has history of ramus PCI and prior stent thrombosis, while on ticagrelor .  Was switched to Effient , this was stopped 12/30 for EGD and biopsy.  NSTEMI initially this admission.  Cath 1/7 in setting of NSTEMI showed occluded ramus and 95% proximal LAD stenosis, LAD treated with DES.  On floor after cath, patient had recurrent chest pain and anterolateral STE.  He was returned to cath lab, noted to have thrombotic occlusion of fresh stent, treated with PTCA and Aggrastat  with restoration of TIMI3 flow.  He will be at significant risk for future thrombotic events in setting of gastric cancer.  No further chest pain.  - Effient /ASA to continue, will have to remain on DAPT indefinitely.  - Restart statin.  2. Cardiogenic shock: Ischemic cardiomyopathy, initial echo with EF 40-45%, mild-moderate MR.  Lactate to 4.4 in setting of stent thrombosis.  Dr. Ladona placed IABP prior to PTCA LAD. Repeat echo showed EF 20-25% with normal RV function.  Now on dobutamine  2.5 and IABP 1:1 with CI 2.9 thermo and co-ox 73%.  Last lactate down to 1.5.  Creatinine stable, CVP < 5.  - Continue dobutamine  at 2.5.    - Wean IABP to 1:2 this morning, if he tolerates this go to 1:3 and will stop heparin /remove IABP.  - Can remove Swan with IABP and follow CVP/co-ox off CVL.  - Continue digoxin  0.125 daily.  - He will not be candidate for advanced therapies, ECMO, etc with newly discovered advanced gastric cancer.  3. VT: In setting of stent thrombosis.  - Continue amiodarone  gtt for now.  4. 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  Associated with biliary and pancreatic duct  obstruction.  New diagnosis.  He has a new percutaneous transhepatic biliary drain. Poor prognosis, not candidate for surgery or radiation and likely would not tolerate palliative chemotherapy.  Oncology has seen and discussed comfort care with his daughter.  - Palliative care consultation has been made.   5. Anemia: Hgb trending down at 8.8.   Discussed at bedside with his daughter.   CRITICAL CARE Performed by: Ezra Shuck  Total critical care time: 45 minutes  Critical care time was exclusive of separately billable procedures and treating other patients.  Critical care was necessary to treat or prevent imminent or life-threatening deterioration.  Critical care was  time spent personally by me on the following activities: development of treatment plan with patient and/or surrogate as well as nursing, discussions with consultants, evaluation of patient's response to treatment, examination of patient, obtaining history from patient or surrogate, ordering and performing treatments and interventions, ordering and review of laboratory studies, ordering and review of radiographic studies, pulse oximetry and re-evaluation of patient's condition.   Length of Stay: 4  Ezra Shuck, MD  05/05/2024, 7:49 AM  Advanced Heart Failure Team Pager [phone] (M-F; 7a - 5p)   Please visit [email or web address]: For overnight coverage please call cardiology fellow first. If fellow not available call Shock/ECMO MD on call.  For ECMO / Mechanical Support (Impella, IABP, LVAD) issues call Shock / ECMO MD on call.

## 2024-05-05 NOTE — Progress Notes (Signed)
 "  NAME:  Bryan Craig, MRN:  [id number], DOB:  07/30/1950, LOS: 4 ADMISSION DATE:  05/01/2024, CONSULTATION DATE:  05/03/24 REFERRING MD:  AHF, CHIEF COMPLAINT:  chest pain   History of Present Illness:  74 year old man with known biliary dilation and elevated LFTs presenting for OP EUS and possible ERCP 1/5 abandoned due to severe ulceration of gastric body and antrum; this area was biopsied and patient admitted for observation and IR consult.  Unfortunately yesterday developed chest pain, dizziness and diagnosed with NSTEMI.   Came to [facility] and had LHC today (assuring no GIB post biopsy) with findings of occluded occluded ramus and high grade LAD narrow, latter was stented; 2 hours later had VTACH with redo LHC showing in-stent thrombosis. This procedure complicated by recurrent runs of VT that have since ablated; IABP placed and patient admitted to [facility] on multiple pressors.  As patient was on TRH service he is now on PCCM service.  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  Pertinent  Medical History  CAD HLD HTN Retinal detachment  Significant Hospital Events: Including procedures, antibiotic start and stop dates in addition to other pertinent events   1/5: admitted for NSTEMI  s/p PCI and IABP on 1/7 1/8: weaning IABP, discussions with family transitioned to DNR/I   Interim History / Subjective:  Weaned off levophed . Tolerated IABP wean to 1:3. Patient with no specific complaints.   Objective    Blood pressure 118/86, pulse 82, temperature 98.8 F (37.1 C), temperature source Core, resp. rate 19, height 5' 7 (1.702 m), weight (P) 67.6 kg, SpO2 97%. PAP: (13-29)/(2-20) 28/11 CVP:  [1 mmHg-10 mmHg] 7 mmHg PCWP:  [15 mmHg] 15 mmHg CO:  [5.9 L/min-6.5 L/min] 5.9 L/min CI:  [2.9 L/min/m2-3.2 L/min/m2] 2.9 L/min/m2      Intake/Output Summary (Last 24 hours) at 05/05/2024 0805 Last data filed at 05/05/2024 0710 Gross per 24 hour  Intake 1776.27 ml   Output 1345 ml  Net 431.27 ml   Filed Weights   05/01/24 0659 05/02/24 1121 05/05/24 0630  Weight: 66.7 kg 68.4 kg (P) 67.6 kg    Examination: General: no acute distress, resting in bed comfortably  HENT: MMM, sclera anicteric  Lungs: breathing comfortably on room air, CTAB Cardiovascular: RRR, S1 and S2, no m/r/g, IABP at 1:3 Abdomen: soft, non-tender, non-distended, RUQ drain with bilious output  Extremities: warm, dry, no edema Neuro: alert and oriented, moves all extremities spontaneously  GU: foley in place  IABP and swan sites soft without evidence of hematoma   Resolved problem list   Assessment and Plan   NSTEMI s/p LAD stenting early afternoon 1/7 c/b in-stent thrombosis manifesting as VT storm later afternoon 1/7 Post cath cardioplegia/ cardiogenic shock; Echo with EF 20-25%, mild LVH, G1DD, normal RV Ventricular tachycardia New diagnosis of 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- s/p IR guided biliary drain before LHC above, onc consulted 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 Hyponatremia, mild likely due to hypervolemia - monitor Lactic acidosis - trending down - Tolerated IABP wean, plan to discontinue today  - Continue dobutamine  for now, will plan to wean in AM at discretion of advanced heart failure  - Continue DAPT with ASA and prasugrel   - On digoxin  - Given stenosis at gastric antrum will give medications crushed in puree  - LFTs are continuing to improve s/p biliary drain, we will continue to monitor - Continue amiodarone  for VT - Seen by oncology  1/8, radiation and surgery not viable options for treatment of malignancy and all treatments would be palliative - recommended comfort measures given age and comorbidities   Goals of care I spoke with daughter, Laneta, and wife, Lonell, in conference room at their request regarding goals of care. They reiterated their conversation with oncology and tell  me that given his advanced, aggressive cancer and lack of treatment options they would like to transition to a hospice facility if possible. They would like to avoid interventions that cause suffering and would like to make him DNR/DNI per his wishes expressed to them overnight. I explained that IABP is coming out today and we can attempt to wean the dobutamine  likely over the weekend. If he is unable to tolerate dobutamine  wean we may need to switch to comfort focused in hospital care. They would like to avoid that if possible. Code status changed to reflect patient's wishes. Will engage TOC and palliative care.   Labs   CBC: Recent Labs  Lab 05/02/24 0020 05/02/24 0438 05/03/24 0745 05/03/24 1727 05/03/24 1751 05/04/24 0255 05/05/24 0457  WBC 4.8 6.0 10.3  --   --  14.5* 12.0*  NEUTROABS  --   --  8.4*  --   --   --   --   HGB 9.3* 8.7* 10.4* 11.6* 11.6* 9.8* 8.8*  HCT 31.5* 29.3* 35.1* 34.0* 34.0* 32.7* 29.7*  MCV 67.0* 67.0* 68.8*  --   --  68.6* 69.6*  PLT 567* 479* 565*  --   --  566* 350    Basic Metabolic Panel: Recent Labs  Lab 05/02/24 0020 05/02/24 0438 05/03/24 0745 05/03/24 1727 05/03/24 1751 05/04/24 0255 05/05/24 0457  NA 136 135 133* 134* 134* 132* 132*  K 4.1 4.1 4.5 4.0 3.9 3.7 3.3*  CL 103 102 100  --   --  99 99  CO2 24 25 24   --   --  26 25  GLUCOSE 140* 137* 122*  --   --  154* 102*  BUN 13 12 8   --   --  17 15  CREATININE 0.70 0.62 0.59*  --   --  0.62 0.49*  CALCIUM  8.8* 8.2* 8.6*  --   --  7.7* 7.7*  MG 2.2  --  2.3  --   --  2.0 2.3  PHOS  --   --   --   --   --  2.3* 2.1*   GFR: Estimated Creatinine Clearance: 75.7 mL/min (A) (by C-G formula based on SCr of 0.49 mg/dL (L)). Recent Labs  Lab 05/02/24 0438 05/03/24 0745 05/03/24 1726 05/03/24 2009 05/03/24 2323 05/04/24 0124 05/04/24 0255 05/04/24 0800 05/05/24 0457  WBC 6.0 10.3  --   --   --   --  14.5*  --  12.0*  LATICACIDVEN  --   --    < > 4.4* 2.7* 2.0*  --  1.5  --    < > =  values in this interval not displayed.    Liver Function Tests: Recent Labs  Lab 05/01/24 0922 05/02/24 0438 05/03/24 0745 05/04/24 0255 05/05/24 0457  AST 248* 187* 441* 260* 109*  ALT 339* 311* 384* 298* 186*  ALKPHOS 1,114* 1,039* 967* 749* 554*  BILITOT 2.0* 1.3* 1.3* 0.9 0.7  PROT 5.0* 5.3* 5.6* 5.0* 4.8*  ALBUMIN 2.8* 2.9* 2.9* 2.8* 2.5*   No results for input(s): LIPASE, AMYLASE in the last 168 hours. No results for input(s): AMMONIA in the last 168 hours.  ABG    Component Value Date/Time   HCO3 23.7 05/03/2024 1751   TCO2 25 05/03/2024 1751   ACIDBASEDEF 1.0 05/03/2024 1751   O2SAT 73 05/05/2024 0500     Coagulation Profile: Recent Labs  Lab 05/01/24 0922  INR 1.0    Cardiac Enzymes: No results for input(s): CKTOTAL, CKMB, CKMBINDEX, TROPONINI in the last 168 hours.  HbA1C: Hgb A1c MFr Bld  Date/Time Value Ref Range Status  03/09/2024 12:01 PM 6.1 4.6 - 6.5 % Final    Comment:    Glycemic Control Guidelines for People with Diabetes:Non Diabetic:  <6%Goal of Therapy: <7%Additional Action Suggested:  >8%   01/24/2023 02:35 PM 6.1 (H) 4.8 - 5.6 % Final    Comment:    (NOTE) Pre diabetes:          5.7%-6.4%  Diabetes:              >6.4%  Glycemic control for   <7.0% adults with diabetes     CBG: Recent Labs  Lab 05/01/24 2249  GLUCAP 143*   Critical care time: 67    The patient is critically ill with multiple organ system failure and requires high complexity decision making for assessment and support, frequent evaluation and titration of therapies, advanced monitoring, review of radiographic studies and interpretation of complex data.   Critical Care Time devoted to patient care services, exclusive of separately billable procedures, described in this note is 40 minutes.  Rexene LOISE Blush, PA-C 05/05/2024 1:52 PM [facility]  For contact information, see Amion. If no response to pager, please call PCCM 2H  APP. After hours, 7PM- 7AM, please call on call APP for 2H.        "

## 2024-05-05 NOTE — Progress Notes (Signed)
 53f IABP removed from RFA at 1533.  Swan and 65f sheath removed from RFV at 1610.  Total manual pressure applied to rt groin for 60 min.  Palpable RPT distal pulse and VSS before and after sheath pull.  No hematoma.  Bed rest instructions given, patient acknowledges and understands.  BP 109/53, HR 91.

## 2024-05-05 NOTE — Progress Notes (Signed)
 "   Referring Physician(s): Dr. Norleen Kiang  Supervising Physician: Hughes Simmonds  Patient Status:  Bryan Craig - In-pt  Chief Complaint: Gastric cancer  Subjective: Patient lying in bed.  Resting comfortably.  Speaks some English; able to communicate needs.  Remains on pressors but continues to wean. Difficult situation in the setting of cardiogenic shock. RUQ drain in place.  Dark, bilious output.   Allergies: Lisinopril, Nitroglycerin , and Penicillins  Medications: Prior to Admission medications  Medication Sig Start Date End Date Taking? Authorizing Provider  aspirin  EC 81 MG tablet Take 1 tablet (81 mg total) by mouth daily. Swallow whole. 07/23/22  Yes Gonfa, Taye T, MD  atorvastatin  (LIPITOR) 80 MG tablet Take 1 tablet (80 mg total) by mouth at bedtime. 02/23/24  Yes Saguier, [place], PA-C  ezetimibe  (ZETIA ) 10 MG tablet TAKE 1 TABLET BY MOUTH EVERY DAY 02/22/24  Yes Court Dorn PARAS, MD  Iron , Ferrous Sulfate , 325 (65 Fe) MG TABS Take 1 tablet by mouth 3 (three) times daily. Patient taking differently: Take 1 tablet by mouth in the morning and at bedtime. 03/31/24  Yes Saguier, [place], PA-C  metoprolol  succinate (TOPROL -XL) 50 MG 24 hr tablet TAKE 1 TABLET (50 MG TOTAL) BY MOUTH DAILY. NEEDS APPT 11/23/23  Yes Saguier, [place], PA-C  ondansetron  (ZOFRAN ) 4 MG tablet Take 1 tablet (4 mg total) by mouth every 8 (eight) hours as needed for nausea or vomiting. 03/30/24  Yes Saguier, [place], PA-C  prasugrel  (EFFIENT ) 10 MG TABS tablet Take 1 tablet (10 mg total) by mouth daily. 01/11/24  Yes Court Dorn PARAS, MD  prednisoLONE acetate (PRED FORTE) 1 % ophthalmic suspension Place 1 drop into the left eye daily.   Yes [provider]  SKYRIZI PEN 150 MG/ML pen Inject into the skin every 3 (three) months. 1 dose every 3 months 02/24/24  Yes [provider]  telmisartan  (MICARDIS ) 40 MG tablet TAKE 1 TABLET BY MOUTH EVERY DAY 04/04/24  Yes Saguier, [place], PA-C  triamcinolone  cream  (KENALOG ) 0.1 % Apply 1 Application topically 2 (two) times daily. Patient taking differently: Apply 1 Application topically daily as needed (irritation). 11/11/21  Yes Saguier, [place], PA-C  sodium polystyrene (KAYEXALATE ) 15 GM/60ML suspension Take 60 ml by mouth daily for 4 days Patient not taking: Reported on 05/01/2024 03/11/24   Saguier, [place], PA-C     Vital Signs: BP 118/86   Pulse 95   Temp 99 F (37.2 C)   Resp 17   Ht 5' 7 (1.702 m)   Wt (P) 149 lb 0.5 oz (67.6 kg)   SpO2 96%   BMI (P) 23.34 kg/m   Physical Exam Vitals and nursing note reviewed.  Constitutional:      General: He is not in acute distress.    Appearance: Normal appearance. He is not ill-appearing.  HENT:     Mouth/Throat:     Mouth: Mucous membranes are moist.     Pharynx: Oropharynx is clear.  Cardiovascular:     Rate and Rhythm: Normal rate and regular rhythm.  Pulmonary:     Effort: Pulmonary effort is normal. No respiratory distress.     Breath sounds: Normal breath sounds.  Abdominal:     General: Abdomen is flat.     Palpations: Abdomen is soft.  Skin:    General: Skin is warm and dry.  Neurological:     General: No focal deficit present.     Mental Status: He is alert and oriented to person, place, and time. Mental  status is at baseline.  Psychiatric:        Mood and Affect: Mood normal.        Behavior: Behavior normal.        Thought Content: Thought content normal.        Judgment: Judgment normal.     Imaging: ECHOCARDIOGRAM COMPLETE Result Date: 05/04/2024    ECHOCARDIOGRAM REPORT   Patient Name:   Bryan Craig Date of Exam: 05/04/2024 Medical Rec #:  [id number]     Height:       67.0 in Accession #:    [id number]    Weight:       150.8 lb Date of Birth:  Aug 22, 1950     BSA:          1.794 m Patient Age:    73 years      BP:           118/86 mmHg Patient Gender: M             HR:           80 bpm. Exam Location:  Inpatient Procedure: 2D Echo, Cardiac Doppler, Color Doppler and  Intracardiac            Opacification Agent (Both Spectral and Color Flow Doppler were            utilized during procedure). Indications:    I50.40* Unspecified combined systolic (congestive) and diastolic                 (congestive) heart failure  History:        Patient has prior history of Echocardiogram examinations, most                 recent 05/02/2024. CHF, CAD and Acute MI, Signs/Symptoms:Chest                 Pain; Risk Factors:Hypertension and Dyslipidemia.  Sonographer:    Ellouise Mose RDCS Referring Phys: [id number] GORDY BERGAMO  Sonographer Comments: Technically difficult study due to poor echo windows, suboptimal subcostal window and suboptimal parasternal window. Patient in trendelenburg position. Balloon pump present. IMPRESSIONS  1. Left ventricular ejection fraction, by estimation, is 20 to 25%. Left ventricular ejection fraction by 2D MOD biplane is 27.6 %. The left ventricle has severely decreased function. The left ventricle demonstrates regional wall motion abnormalities (see scoring diagram/findings for description). There is mild asymmetric left ventricular hypertrophy of the basal-septal segment. Left ventricular diastolic parameters are consistent with Grade I diastolic dysfunction (impaired relaxation).  2. Right ventricular systolic function is normal. The right ventricular size is normal.  3. The mitral valve is normal in structure. No evidence of mitral valve regurgitation. No evidence of mitral stenosis.  4. The aortic valve is tricuspid. There is moderate calcification of the aortic valve. Aortic valve regurgitation is not visualized. Aortic valve sclerosis is present, with no evidence of aortic valve stenosis.  5. IABP seen in distal aortic arch. Comparison(s): Changes from prior study are noted. The left ventricular function is worsened. LVEF has worsened compared to prior study, with wall motion abnormalities as noted. Conclusion(s)/Recommendation(s): No left ventricular mural or apical  thrombus/thrombi. FINDINGS  Left Ventricle: Left ventricular ejection fraction, by estimation, is 20 to 25%. Left ventricular ejection fraction by 2D MOD biplane is 27.6 %. The left ventricle has severely decreased function. The left ventricle demonstrates regional wall motion abnormalities. Definity  contrast agent was given IV to delineate the left ventricular endocardial borders. The left ventricular internal  cavity size was normal in size. There is mild asymmetric left ventricular hypertrophy of the basal-septal segment. Left ventricular diastolic parameters are consistent with Grade I diastolic dysfunction (impaired relaxation).  LV Wall Scoring: The mid and distal anterior wall, mid and distal lateral wall, mid anterolateral segment, apical septal segment, and apex are akinetic. The anterior septum, basal inferolateral segment, basal anterolateral segment, mid inferoseptal segment, basal anterior segment, and basal inferoseptal segment are hypokinetic. The entire inferior wall is normal. Right Ventricle: The right ventricular size is normal. No increase in right ventricular wall thickness. Right ventricular systolic function is normal. Left Atrium: Left atrial size was normal in size. Right Atrium: Right atrial size was normal in size. Pericardium: There is no evidence of pericardial effusion. Mitral Valve: The mitral valve is normal in structure. No evidence of mitral valve regurgitation. No evidence of mitral valve stenosis. Tricuspid Valve: The tricuspid valve is normal in structure. Tricuspid valve regurgitation is trivial. No evidence of tricuspid stenosis. Aortic Valve: The aortic valve is tricuspid. There is moderate calcification of the aortic valve. Aortic valve regurgitation is not visualized. Aortic valve sclerosis is present, with no evidence of aortic valve stenosis. Pulmonic Valve: The pulmonic valve was not well visualized. Pulmonic valve regurgitation is not visualized. No evidence of pulmonic  stenosis. Aorta: IABP seen in distal aortic arch. The ascending aorta was not well visualized and the aortic root is normal in size and structure. Venous: The inferior vena cava was not well visualized. IAS/Shunts: No atrial level shunt detected by color flow Doppler. Additional Comments: There is a small pleural effusion in the left lateral region.  LEFT VENTRICLE PLAX 2D                        Biplane EF (MOD) LVIDd:         4.80 cm         LV Biplane EF:   Left LVIDs:         4.10 cm                          ventricular LV PW:         1.10 cm                          ejection LV IVS:        1.30 cm                          fraction by LVOT diam:     2.30 cm                          2D MOD LV SV:         72                               biplane is LV SV Index:   40                               27.6 %. LVOT Area:     4.15 cm  Diastology                                LV e' medial:    3.70 cm/s LV Volumes (MOD)               LV E/e' medial:  17.6 LV vol d, MOD    123.0 ml      LV e' lateral:   11.20 cm/s A2C:                           LV E/e' lateral: 5.8 LV vol d, MOD    109.0 ml A4C: LV vol s, MOD    79.6 ml A2C: LV vol s, MOD    88.6 ml A4C: LV SV MOD A2C:   43.4 ml LV SV MOD A4C:   109.0 ml LV SV MOD BP:    32.2 ml RIGHT VENTRICLE             IVC RV S prime:     15.90 cm/s  IVC diam: 1.80 cm TAPSE (M-mode): 1.5 cm LEFT ATRIUM           Index       RIGHT ATRIUM          Index LA diam:      3.30 cm 1.84 cm/m  RA Area:     9.60 cm LA Vol (A2C): 11.4 ml 6.36 ml/m  RA Volume:   20.10 ml 11.21 ml/m LA Vol (A4C): 15.9 ml 8.87 ml/m  AORTIC VALVE LVOT Vmax:   110.00 cm/s LVOT Vmean:  73.900 cm/s LVOT VTI:    0.173 m  AORTA Ao Root diam: 2.75 cm MITRAL VALVE MV Area (PHT): 3.60 cm    SHUNTS MV Decel Time: 211 msec    Systemic VTI:  0.17 m MV E velocity: 65.00 cm/s  Systemic Diam: 2.30 cm MV A velocity: 57.60 cm/s MV E/A ratio:  1.13 Shelda Bruckner MD Electronically signed by  Shelda Bruckner MD Signature Date/Time: 05/04/2024/1:45:19 PM    Final    DG Chest Port 1 View Result Date: 05/03/2024 EXAM: 1 VIEW(S) XRAY OF THE CHEST 05/03/2024 11:38:40 PM COMPARISON: 05/02/2024 CLINICAL HISTORY: Encounter for central line placement [id number] FINDINGS: LINES, TUBES AND DEVICES: Left internal jugular central venous catheter in place with tip overlying the right atrium. Swan-Ganz catheter in place with tip overlying the right lower lobe pulmonary artery. Intra-aortic balloon pump marker overlying the aortic arch. Right upper quadrant pigtail drainage catheter noted. LUNGS AND PLEURA: Low lung volumes. Bibasilar atelectasis. No pleural effusion. No pneumothorax. HEART AND MEDIASTINUM: Coronary artery stent noted. No acute abnormality of the cardiac and mediastinal silhouettes. BONES AND SOFT TISSUES: No acute osseous abnormality. IMPRESSION: 1. Left internal jugular central venous catheter, Swan-Ganz catheter, and intra-aortic balloon pump in appropriate positions. 2. Low lung volumes and bibasilar atelectasis. Electronically signed by: Elsie Gravely MD 05/03/2024 11:41 PM EST RP Workstation: [id number]   CARDIAC CATHETERIZATION Addendum Date: 05/03/2024 Cardiac Catheterization 05/03/2024: Hemodynamic data: RA 12/11, mean 11 mmHg. RV 30/6, EDP 10 mmHg PA 31/15, mean 21 mmHg PW 22/29, mean 23 mmHg. QP/QS 1.0.  CO 4.81, CI 2.69 by Fick.  Papi 1.5. Angiographic data: LAD is occluded in the ostium. Intervention data: Successful balloon angioplasty with 3.0 x 20 mm Emerge balloon into the ostial and also proximal LAD revealing thrombotic occlusion of the stent itself, heavy thrombus was evident  which cleared up with intracoronary Aggrastat .  0% residual stenosis with improvement in TIMI flow from 0-3. There was also a distal Cx occlusion at the beginning of the procedure again suggesting hypercoagulable state, this resolved with intracoronary Aggrastat  infusion. Impression and recommendations:  Intra-aortic balloon pump placed, patient's hemodynamics improved, transferred to intensive care unit for further management.  Patient will need to be on DAPT without interruption as he is hypercoagulable probably due to his underlying malignancy.  Extremely difficult situation.  Result Date: 05/03/2024 Images from the original result were not included. Cardiac Catheterization 05/03/2024: Hemodynamic data: RA 12/11, mean 11 mmHg. RV 30/6, EDP 10 mmHg PA 31/15, mean 21 mmHg PW 22/29, mean 23 mmHg. QP/QS 1.0.  CO 4.81, CI 2.69 by Fick.  Papi 1.5. Angiographic data: LAD is occluded in the ostium. Intervention data: Successful balloon angioplasty with 3.0 x 20 mm Emerge balloon into the ostial and also proximal LAD revealing thrombotic occlusion of the stent itself, heavy thrombus was evident which cleared up with intracoronary Aggrastat .  0% residual stenosis with improvement in TIMI flow from 0-3. Impression and recommendations: Intra-aortic balloon pump placed, patient's hemodynamics improved, transferred to intensive care unit for further management.  Patient will need to be on DAPT without interruption as he is hypercoagulable probably due to his underlying malignancy.  Extremely difficult situation.   CARDIAC CATHETERIZATION Result Date: 05/03/2024 Images from the original result were not included. Cardiac Catheterization 05/03/2024: Hemodynamic data: LVEDP 12 mmHg.  No pressure gradient across aortic valve. Angiographic data: LM: Large-caliber vessel, smooth and normal. LAD: Is a very large caliber vessel giving origin to a large D1 which is secondary branches with ostial 20 to 30% stenosis.  Proximal LAD prior to D1 bifurcation has a ulcerated and thrombotic 90% stenosis, and has a tandem 60 to 70% stenosis. LCx: Large-caliber vessel, ostial 50% stenosis, mid 30% stenosis.  Gives origin to large OM1 and a moderate-sized OM 2. RI: It is occluded in the proximal segment at previously stented segment.  Appears to be  old. Intervention data: After collegial discussions, I suspect to proceed with aspiration thrombectomy and low-pressure balloon inflation at the ulcerated and thrombotic proximal and mid LAD.  Unfortunately following the procedure, patient had significant of organized white thrombus aspirated and had a very unstable lesion that occluded hence had to be stented with a 3.0 x 38 mm Synergy XD DES at 20 atmospheric pressure.  Slight stepup and stepdown were evident in the stented segment with excellent TIMI-3 flow without evidence of edge dissection. Impression and recommendations: Patient will need DAPT for 1 year however I tried to not to stent the lesion in view of his malignancy and probable further need for GI workup.  Unfortunately without intervention to the LAD, it would have been lethal for the patient as he would have probably occluded the LAD at some point during the hospitalization in view of thrombotic state.  Patient transferred to the floor in a stable condition.   IR BILIARY DRAIN PLACEMENT WITH CHOLANGIOGRAM Result Date: 05/03/2024 INDICATION: Aborted ERCP. Briefly, 74 year old male with distal CBD obstruction, suspected malignant. 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 EXAM: Procedures; 1. PERCUTANEOUS TRANSHEPATIC CHOLANGIOGRAM 2. ULTRASOUND AND FLUOROSCOPIC GUIDED BILIARY DRAINAGE TUBE PLACEMENT COMPARISON:  CT AP, 05/02/2023.  MRCP, 03/28/2024. MEDICATIONS: Rocephin  2 gm IV; The antibiotic was administered with an appropriate time frame prior to the initiation of the procedure CONTRAST:  20 mL Omnipaque  300 - administered into the biliary tree. ANESTHESIA/SEDATION: Moderate (conscious) sedation was  employed during this procedure. A total of Versed  1 mg and Fentanyl  50 mcg was administered intravenously. Moderate Sedation Time: 21 minutes. The patient's level of consciousness and vital signs were monitored continuously by radiology nursing throughout the procedure under my direct  supervision. FLUOROSCOPY: Radiation Exposure Index and estimated peak skin dose (PSD); Reference air kerma (RAK), 8 mGy. COMPLICATIONS: None immediate. TECHNIQUE: Informed written consent was obtained from the patient and/or patient's representative after a discussion of the risks, benefits and alternatives to treatment. Questions regarding the procedure were encouraged and answered. A timeout was performed prior to the initiation of the procedure. The RIGHT upper abdominal quadrant was prepped and draped in the usual sterile fashion, and a sterile drape was applied covering the operative field. Maximum barrier sterile technique with sterile gowns and gloves were used for the procedure. A timeout was performed prior to the initiation of the procedure. Ultrasound scanning of the right upper abdominal quadrant was performed to delineate the anatomy and avoid transgression of the gallbladder or the pleura. After the overlying soft tissues were anesthetized with 1% Lidocaine  with epinephrine , under direct ultrasound guidance, a 22 gauge Chiba needle was utilized to cannulate a peripheral aspect of an intrahepatic biliary duct. A RIGHT-sided approach distal to the biliary hilum was targeted. Appropriate position was confirmed with limited contrast injection. Next, the duct was cannulated with a Nitrex wire and dilated with an Accustick set under fluoroscopic guidance. Limited cholangiograms were performed in various obliquities confirming appropriate access. Next, a 4 Fr angled glide catheter was advanced through the outer sheath of the Accustick set and with the use of a stiff Glidewire, advanced through the biliary hilum, common bile duct and ampulla to the level of the duodenum. Contrast injection confirmed appropriate positioning. Under intermittent fluoroscopic guidance and over an Amplatz wire, the track was dilated ultimately allowing placement of a 10 Fr biliary drainage catheter with coil ultimately locked  within the duodenum. Contrast was injected and a completion radiographs were obtained in various obliquities. The catheter was connected to a drainage bag which yielded the brisk return of clear bile. The catheter was secured to the skin with an interrupted suture and StatLock device. Dressings were applied. The patient tolerated the procedure well without immediate postprocedural complication. FINDINGS: *Sonographic evaluation of the liver demonstrates intrahepatic biliary ductal dilatation as was as gallbladder distention. *Under direct ultrasound guidance, a dilated duct distal to the hilum from a RIGHT-sided approach was accessed *Placement of a 10 Fr biliary drainage catheter with end coiled and locked within the duodenum and radiopaque marker located proximal to the level of the biliary hilum. *Limited contrast injection demonstrates dilatation of the biliary tree, with obstruction at the distal the CBD. *30 mL of bile was submitted for microbiological analysis. IMPRESSION: 1. Successful placement of a 10 Fr percutaneous biliary drainage catheter via a RIGHT transhepatic approach, with end coiled and locked within the duodenum. 2. Distal common bile duct obstruction, with shouldering suspicious for malignancy. Bile submitted for microbiological analysis. RECOMMENDATIONS: The patient will return to [facility] ([facility]) for routine drainage catheter evaluation and exchange in 8 weeks. Thom Hall, MD [facility] Electronically Signed   By: Thom Hall M.D.   On: 05/03/2024 10:34   DG CHEST PORT 1 VIEW Result Date: 05/02/2024 CLINICAL DATA:  Chest pain. Status post upper endoscopy and endoscopic ultrasound yesterday. EXAM: PORTABLE CHEST 1 VIEW COMPARISON:  01/24/2023. FINDINGS: Normal-sized heart. Decreased inspiration with mild bibasilar linear atelectasis.  Remainder of the lungs are clear. No pneumothorax or pneumoperitoneum seen.  Mild thoracolumbar spine degenerative changes. IMPRESSION: Decreased inspiration with mild bibasilar linear atelectasis. Electronically Signed   By: Elspeth Bathe M.D.   On: 05/02/2024 16:45   ECHOCARDIOGRAM COMPLETE Result Date: 05/02/2024    ECHOCARDIOGRAM REPORT   Patient Name:   Bryan Craig Date of Exam: 05/02/2024 Medical Rec #:  [id number]     Height:       67.0 in Accession #:    [id number]    Weight:       150.8 lb Date of Birth:  1950/12/29     BSA:          1.794 m Patient Age:    73 years      BP:           155/129 mmHg Patient Gender: M             HR:           82 bpm. Exam Location:  Inpatient Procedure: 2D Echo, Cardiac Doppler, Color Doppler and Intracardiac            Opacification Agent (Both Spectral and Color Flow Doppler were            utilized during procedure). Indications:    NSTEMI I21.4  History:        Patient has prior history of Echocardiogram examinations, most                 recent 06/15/2023. CAD; Risk Factors:Hypertension.  Sonographer:    Gwendolynn Blush RDCS Referring Phys: [id number] BRIAN S CRENSHAW IMPRESSIONS  1. Left ventricular ejection fraction, by estimation, is 40 to 45%. The left ventricle has mildly decreased function. The left ventricle demonstrates regional wall motion abnormalities (see scoring diagram/findings for description). Left ventricular diastolic parameters are consistent with Grade I diastolic dysfunction (impaired relaxation).  2. Right ventricular systolic function is normal. The right ventricular size is normal.  3. The mitral valve is grossly normal. Mild to moderate mitral valve regurgitation. No evidence of mitral stenosis.  4. The aortic valve was not well visualized. Aortic valve regurgitation is trivial. No aortic stenosis is present. Comparison(s): No significant change from prior study. Prior images reviewed side by side. FINDINGS  Left Ventricle: Left ventricular ejection fraction, by estimation, is 40 to 45%. The left ventricle has mildly decreased  function. The left ventricle demonstrates regional wall motion abnormalities. Definity  contrast agent was given IV to delineate the left ventricular endocardial borders. The left ventricular internal cavity size was normal in size. There is no left ventricular hypertrophy. Left ventricular diastolic parameters are consistent with Grade I diastolic dysfunction (impaired relaxation).  LV Wall Scoring: The apical lateral segment, apical anterior segment, and apical inferior segment are akinetic. The posterior wall is hypokinetic. The apical septal segment is normal. Right Ventricle: The right ventricular size is normal. No increase in right ventricular wall thickness. Right ventricular systolic function is normal. Left Atrium: Left atrial size was normal in size. Right Atrium: Right atrial size was normal in size. Pericardium: There is no evidence of pericardial effusion. Mitral Valve: The mitral valve is grossly normal. Mild to moderate mitral valve regurgitation. No evidence of mitral valve stenosis. Tricuspid Valve: The tricuspid valve is normal in structure. Tricuspid valve regurgitation is not demonstrated. No evidence of tricuspid stenosis. Aortic Valve: The aortic valve was not well visualized. Aortic valve regurgitation is trivial. No aortic stenosis is present. Pulmonic Valve: The pulmonic valve  was not well visualized. Pulmonic valve regurgitation is not visualized. No evidence of pulmonic stenosis. Aorta: The aortic root and ascending aorta are structurally normal, with no evidence of dilitation. IAS/Shunts: No atrial level shunt detected by color flow Doppler.  LEFT VENTRICLE PLAX 2D LVIDd:         5.10 cm      Diastology LVIDs:         4.50 cm      LV e' medial:    6.96 cm/s LV PW:         1.00 cm      LV E/e' medial:  9.8 LV IVS:        1.20 cm      LV e' lateral:   6.64 cm/s LVOT diam:     2.00 cm      LV E/e' lateral: 10.3 LV SV:         72 LV SV Index:   40 LVOT Area:     3.14 cm  LV Volumes (MOD) LV  vol d, MOD A2C: 162.0 ml LV vol d, MOD A4C: 146.0 ml LV vol s, MOD A2C: 90.1 ml LV vol s, MOD A4C: 84.8 ml LV SV MOD A2C:     71.9 ml LV SV MOD A4C:     146.0 ml LV SV MOD BP:      64.9 ml RIGHT VENTRICLE RV S prime:     14.00 cm/s TAPSE (M-mode): 1.8 cm LEFT ATRIUM             Index        RIGHT ATRIUM           Index LA diam:        3.60 cm 2.01 cm/m   RA Area:     10.10 cm LA Vol (A2C):   46.1 ml 25.70 ml/m  RA Volume:   19.20 ml  10.71 ml/m LA Vol (A4C):   32.9 ml 18.34 ml/m LA Biplane Vol: 40.2 ml 22.41 ml/m  AORTIC VALVE LVOT Vmax:   110.00 cm/s LVOT Vmean:  68.600 cm/s LVOT VTI:    0.230 m  AORTA Ao Root diam: 3.50 cm Ao Asc diam:  3.00 cm MITRAL VALVE               TRICUSPID VALVE MV Area (PHT): 3.86 cm    TR Peak grad:   39.2 mmHg MV E velocity: 68.10 cm/s  TR Vmax:        313.00 cm/s MV A velocity: 98.10 cm/s MV E/A ratio:  0.69        SHUNTS                            Systemic VTI:  0.23 m                            Systemic Diam: 2.00 cm Stanly Leavens MD Electronically signed by Stanly Leavens MD Signature Date/Time: 05/02/2024/3:15:29 PM    Final    CT ABDOMEN PELVIS W CONTRAST Result Date: 05/01/2024 EXAM: CT ABDOMEN AND PELVIS WITH CONTRAST 05/01/2024 04:45:00 PM TECHNIQUE: CT of the abdomen and pelvis was performed with the administration of 80 mL of iohexol  (OMNIPAQUE ) 350 MG/ML injection. Multiplanar reformatted images are provided for review. Automated exposure control, iterative reconstruction, and/or weight-based adjustment of the mA/kV was utilized to reduce the radiation dose to as low as reasonably achievable.  COMPARISON: 08/03/2022, 03/28/2024 CLINICAL HISTORY: Biliary obstruction suspected (Ped 0-17y); Per discussion with IR (perform CT-Abdomen as Pancreas Protocol. FINDINGS: LOWER CHEST: No acute abnormality. LIVER: The liver is unremarkable. GALLBLADDER AND BILE DUCTS: Diffusely dilated and fluid filled gallbladder without wall thickening or radiopaque stones. Similar  severe diffuse intrahepatic and extrahepatic biliary ductal dilation with focal short segment narrowing of the mid CBD. No radiopaque choledocholithiasis. SPLEEN: No acute abnormality. 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 ADRENAL GLANDS: No acute abnormality. KIDNEYS, URETERS AND BLADDER: Small nonobstructive bilateral calyceal calculi. No hydronephrosis in either kidney. No perinephric or periureteral stranding. Urinary bladder is unremarkable. GI AND BOWEL: Moderate wall thickening throughout the stomach. Colonic diverticulosis. No changes of acute diverticulitis. Normal gas filled appendix. Right inguinal hernia containing a short segment of nondistended ileum. No associated bowel obstruction. No extravasation of enteric contrast to suggest bowel perforation. PERITONEUM AND RETROPERITONEUM: No ascites. No free air. VASCULATURE: Aorta is normal in caliber. Retroaortic left renal vein. Diffuse aortoiliac atherosclerosis. LYMPH NODES: No lymphadenopathy. REPRODUCTIVE ORGANS: Severe prostatomegaly. BONES AND SOFT TISSUES: Multilevel degenerative disc disease of the spine. No acute osseous abnormality. No focal soft tissue abnormality. IMPRESSION: 1. No acute intraabdominal or pelvic abnormality. 2. Similar severe, diffuse intrahepatic and extrahepatic biliary ductal dilation with short segment narrowing of the mid CBD. 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 Follow-up ERCP with EUS recommended for the further characterization. 3. Moderate wall thickening throughout the stomach, which may be due to underdistention or gastritis. 4. Scattered colonic diverticulosis. No changes of  acute diverticulitis. 5. Severe prostatomegaly. Electronically signed by: Rogelia Myers MD 05/01/2024 06:01 PM EST RP Workstation: [id number]    Labs:  CBC: Recent Labs    05/02/24 0438 05/03/24 0745 05/03/24 1727 05/03/24 1751 05/04/24 0255 05/05/24 0457  WBC 6.0 10.3  --   --  14.5* 12.0*  HGB 8.7* 10.4* 11.6* 11.6* 9.8* 8.8*  HCT 29.3* 35.1* 34.0* 34.0* 32.7* 29.7*  PLT 479* 565*  --   --  566* 350    COAGS: Recent Labs    05/01/24 0922  INR 1.0    BMP: Recent Labs    05/02/24 0438 05/03/24 0745 05/03/24 1727 05/03/24 1751 05/04/24 0255 05/05/24 0457  NA 135 133* 134* 134* 132* 132*  K 4.1 4.5 4.0 3.9 3.7 3.3*  CL 102 100  --   --  99 99  CO2 25 24  --   --  26 25  GLUCOSE 137* 122*  --   --  154* 102*  BUN 12 8  --   --  17 15  CALCIUM  8.2* 8.6*  --   --  7.7* 7.7*  CREATININE 0.62 0.59*  --   --  0.62 0.49*  GFRNONAA >60 >60  --   --  >60 >60    LIVER FUNCTION TESTS: Recent Labs    05/02/24 0438 05/03/24 0745 05/04/24 0255 05/05/24 0457  BILITOT 1.3* 1.3* 0.9 0.7  AST 187* 441* 260* 109*  ALT 311* 384* 298* 186*  ALKPHOS 1,039* 967* 749* 554*  PROT 5.3* 5.6* 5.0* 4.8*  ALBUMIN 2.9* 2.9* 2.8* 2.5*    Assessment and Plan: 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  Patient with  complex medical needs and overall instability with attempt to wean from IABP/pressors.  With respect to his biliary drain, it is functioning well, flushes without issue, and has improved his Tbili now 0.7.  Site intact.  No CHG disc needed as next dressing change.   Continue current care.  Would typically recommend a biliary drain eval/exchange in 8 weeks pending outcome of hospitalization as patient is in ongoing conversations with Palliative care team.    Electronically Signed: Tanyah Debruyne Sue-Ellen Arik Husmann, PA 05/05/2024, 3:05 PM   I spent a total of 15 Minutes at the the patient's bedside AND on the patient's hospital floor or unit, greater  than 50% of which was counseling/coordinating care for biliary obstruction.       "

## 2024-05-06 DIAGNOSIS — Z515 Encounter for palliative care: Secondary | ICD-10-CM

## 2024-05-06 DIAGNOSIS — D131 Benign neoplasm of stomach: Secondary | ICD-10-CM

## 2024-05-06 DIAGNOSIS — Z7189 Other specified counseling: Secondary | ICD-10-CM

## 2024-05-06 LAB — COMPREHENSIVE METABOLIC PANEL WITH GFR
ALT: 141 U/L — ABNORMAL HIGH (ref 0–44)
AST: 61 U/L — ABNORMAL HIGH (ref 15–41)
Albumin: 2.5 g/dL — ABNORMAL LOW (ref 3.5–5.0)
Alkaline Phosphatase: 483 U/L — ABNORMAL HIGH (ref 38–126)
Anion gap: 6 (ref 5–15)
BUN: 20 mg/dL (ref 8–23)
CO2: 26 mmol/L (ref 22–32)
Calcium: 7.8 mg/dL — ABNORMAL LOW (ref 8.9–10.3)
Chloride: 98 mmol/L (ref 98–111)
Creatinine, Ser: 0.46 mg/dL — ABNORMAL LOW (ref 0.61–1.24)
GFR, Estimated: >60 mL/min
Glucose, Bld: 122 mg/dL — ABNORMAL HIGH (ref 70–99)
Potassium: 3.8 mmol/L (ref 3.5–5.1)
Sodium: 130 mmol/L — ABNORMAL LOW (ref 135–145)
Total Bilirubin: 0.8 mg/dL (ref 0.0–1.2)
Total Protein: 4.9 g/dL — ABNORMAL LOW (ref 6.5–8.1)

## 2024-05-06 LAB — CBC
HCT: 29.6 % — ABNORMAL LOW (ref 39.0–52.0)
Hemoglobin: 8.7 g/dL — ABNORMAL LOW (ref 13.0–17.0)
MCH: 20.2 pg — ABNORMAL LOW (ref 26.0–34.0)
MCHC: 29.4 g/dL — ABNORMAL LOW (ref 30.0–36.0)
MCV: 68.8 fL — ABNORMAL LOW (ref 80.0–100.0)
Platelets: 359 K/uL (ref 150–400)
RBC: 4.3 MIL/uL (ref 4.22–5.81)
RDW: 25 % — ABNORMAL HIGH (ref 11.5–15.5)
WBC: 11.7 K/uL — ABNORMAL HIGH (ref 4.0–10.5)
nRBC: 0 % (ref 0.0–0.2)

## 2024-05-06 LAB — COOXEMETRY PANEL
Carboxyhemoglobin: 1.7 % — ABNORMAL HIGH (ref 0.5–1.5)
Carboxyhemoglobin: 2.2 % — ABNORMAL HIGH (ref 0.5–1.5)
Methemoglobin: <0.7 % (ref 0.0–1.5)
Methemoglobin: <0.7 % (ref 0.0–1.5)
O2 Saturation: 70.8 %
O2 Saturation: 68.6 %
Total hemoglobin: 9.1 g/dL — ABNORMAL LOW (ref 12.0–16.0)
Total hemoglobin: 8.6 g/dL — ABNORMAL LOW (ref 12.0–16.0)

## 2024-05-06 LAB — MAGNESIUM: Magnesium: 2.3 mg/dL (ref 1.7–2.4)

## 2024-05-06 LAB — PHOSPHORUS: Phosphorus: 2.6 mg/dL (ref 2.5–4.6)

## 2024-05-06 MED ORDER — AMIODARONE HCL 200 MG PO TABS
200.0000 mg | ORAL_TABLET | Freq: Every day | ORAL | Status: DC
  Administered 2024-05-06 – 2024-05-07 (×2): 200 mg via ORAL
  Filled 2024-05-06 (×2): qty 1

## 2024-05-06 MED ORDER — ENOXAPARIN SODIUM 40 MG/0.4ML IJ SOSY
40.0000 mg | PREFILLED_SYRINGE | INTRAMUSCULAR | Status: DC
  Administered 2024-05-06 – 2024-05-07 (×2): 40 mg via SUBCUTANEOUS
  Filled 2024-05-06 (×2): qty 0.4

## 2024-05-06 MED ORDER — POTASSIUM CHLORIDE CRYS ER 20 MEQ PO TBCR
40.0000 meq | EXTENDED_RELEASE_TABLET | Freq: Once | ORAL | Status: AC
  Administered 2024-05-06: 40 meq via ORAL
  Filled 2024-05-06: qty 2

## 2024-05-06 NOTE — Progress Notes (Signed)
 Patient ID: Bryan Craig, male   DOB: 1951/04/04, 74 y.o.   MRN: [id number] P    Advanced Heart Failure Rounding Note  Cardiologist: Dorn Lesches, MD  AHF Cardiologist: Rolan Chief Complaint: Cardiogenic shock Patient Profile   Bryan Craig is a 74 y.o. male with gastric cancer, bile and pancreatic duct obstruction, and CAD s/p MI with cardiogenic shock   Subjective:    Patient is on dobutamine  2.5, IABP removed 1/9.  Stable MAP. Creatinine 0.46.  LFTs remain elevated but trending down.  Co-ox 71%, CVP < 5.   On amiodarone  gtt 30 mg/hr, no further arrhythmias.   Patient denies chest pain or dyspnea.    Objective:   Weight Range: (P) 67.6 kg Body mass index is 23.34 kg/m (pended).   Vital Signs:   Temp:  [97.9 F (36.6 C)-99.3 F (37.4 C)] 98.3 F (36.8 C) (01/10 0325) Pulse Rate:  [74-183] 74 (01/10 0700) Resp:  [11-22] 13 (01/10 0700) SpO2:  [76 %-98 %] 96 % (01/10 0700) Arterial Line BP: (88-136)/(42-84) 124/58 (01/10 0700) Last BM Date :  (PTA)  Weight change: Filed Weights   05/01/24 0659 05/02/24 1121 05/05/24 0630  Weight: 66.7 kg 68.4 kg (P) 67.6 kg    Intake/Output:   Intake/Output Summary (Last 24 hours) at 05/06/2024 0743 Last data filed at 05/06/2024 0600 Gross per 24 hour  Intake 503.53 ml  Output 1340 ml  Net -836.47 ml     Physical Exam   General: NAD Neck: No JVD, no thyromegaly or thyroid  nodule.  Lungs: Clear to auscultation bilaterally with normal respiratory effort. CV: Nondisplaced PMI.  Heart regular S1/S2, no S3/S4, no murmur.  No peripheral edema.   Abdomen: Soft, nontender, no hepatosplenomegaly, no distention.  Skin: Intact without lesions or rashes.  Neurologic: Alert and oriented x 3.  Psych: Normal affect. Extremities: No clubbing or cyanosis.  HEENT: Normal.   Telemetry   NSR, occasional PVCs (personally reviewed)  Labs   CBC Recent Labs    05/03/24 0745 05/03/24 1727 05/05/24 0457 05/06/24 0436  WBC 10.3    < > 12.0* 11.7*  NEUTROABS 8.4*  --   --   --   HGB 10.4*   < > 8.8* 8.7*  HCT 35.1*   < > 29.7* 29.6*  MCV 68.8*   < > 69.6* 68.8*  PLT 565*   < > 350 359   < > = values in this interval not displayed.   Basic Metabolic Panel Recent Labs    98/90/73 0457 05/06/24 0436  NA 132* 130*  K 3.3* 3.8  CL 99 98  CO2 25 26  GLUCOSE 102* 122*  BUN 15 20  CREATININE 0.49* 0.46*  CALCIUM  7.7* 7.8*  MG 2.3 2.3  PHOS 2.1* 2.6   Liver Function Tests Recent Labs    05/05/24 0457 05/06/24 0436  AST 109* 61*  ALT 186* 141*  ALKPHOS 554* 483*  BILITOT 0.7 0.8  PROT 4.8* 4.9*  ALBUMIN 2.5* 2.5*   No results for input(s): LIPASE, AMYLASE in the last 72 hours. Cardiac Enzymes No results for input(s): CKTOTAL, CKMB, CKMBINDEX, TROPONINI in the last 72 hours.  BNP: BNP (last 3 results) No results for input(s): BNP in the last 8760 hours.  ProBNP (last 3 results) No results for input(s): PROBNP in the last 8760 hours.   D-Dimer No results for input(s): DDIMER in the last 72 hours. Hemoglobin A1C No results for input(s): HGBA1C in the last 72 hours. Fasting Lipid Panel No  results for input(s): CHOL, HDL, LDLCALC, TRIG, CHOLHDL, LDLDIRECT in the last 72 hours. Medications:   Scheduled Medications:  amiodarone   200 mg Oral Daily   aspirin  EC  81 mg Oral Daily   atorvastatin   80 mg Oral Daily   Chlorhexidine  Gluconate Cloth  6 each Topical Daily   digoxin   0.125 mg Oral Daily   enoxaparin  (LOVENOX ) injection  40 mg Subcutaneous Q24H   feeding supplement  237 mL Oral TID BM   pantoprazole  (PROTONIX ) IV  40 mg Intravenous Daily   potassium chloride   40 mEq Oral Once   prasugrel   10 mg Oral Daily   sodium chloride  flush  3 mL Intravenous Q12H   sodium chloride  flush  5 mL Intracatheter Q8H    Infusions:    PRN Medications: albuterol , HYDROmorphone  (DILAUDID ) injection, iohexol , ondansetron  **OR** ondansetron  (ZOFRAN ) IV, mouth rinse,  oxyCODONE , simethicone , sodium chloride  flush, sodium chloride  flush, traZODone   Assessment/Plan   1. CAD: Patient has history of ramus PCI and prior stent thrombosis, while on ticagrelor .  Was switched to Effient , this was stopped 12/30 for EGD and biopsy.  NSTEMI initially this admission.  Cath 1/7 in setting of NSTEMI showed occluded ramus and 95% proximal LAD stenosis, LAD treated with DES.  On floor after cath, patient had recurrent chest pain and anterolateral STE.  He was returned to cath lab, noted to have thrombotic occlusion of fresh stent, treated with PTCA and Aggrastat  with restoration of TIMI3 flow.  He will be at significant risk for future thrombotic events in setting of gastric cancer.  No further chest pain.  - Effient /ASA to continue, will have to remain on DAPT indefinitely.  - Continue statin.  2. Cardiogenic shock: Ischemic cardiomyopathy, initial echo with EF 40-45%, mild-moderate MR.  Lactate to 4.4 in setting of stent thrombosis.  Dr. Ladona placed IABP prior to PTCA LAD. Repeat echo showed EF 20-25% with normal RV function.  IABP removed 1/9. Now on dobutamine  2.5 with co-ox 71%.  Creatinine stable, CVP < 5.  - Stop dobutamine  today.   - Continue digoxin  0.125 daily.  - He will not be candidate for advanced therapies, ECMO, etc with newly discovered advanced gastric cancer.  3. VT: In setting of stent thrombosis.  - Transition amiodarone  to po.  4. 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  Associated with biliary and pancreatic duct obstruction.  New diagnosis.  He has a new percutaneous transhepatic biliary drain. Poor prognosis, not candidate for surgery or radiation and likely would not tolerate palliative chemotherapy.  Oncology has seen and discussed comfort care with his daughter.  - Palliative care consultation has been made.   5. Anemia: Hgb stable 8.7.    Plan currently will be hospice and comfort care after he leaves the acute care setting. If he gets  to the point where gastric obstruction worsens and he cannot take pills, there is high risk that he will have stent thrombosis.  Would not return to the hospital and would treat symptomatically in that situation.  Awaiting palliative care consultation to help with hospice placement.   CRITICAL CARE Performed by: Ezra Shuck  Total critical care time: 40 minutes  Critical care time was exclusive of separately billable procedures and treating other patients.  Critical care was necessary to treat or prevent imminent or life-threatening deterioration.  Critical care was time spent personally by me on the following activities: development of treatment plan with patient and/or surrogate as well as nursing, discussions with consultants,  evaluation of patient's response to treatment, examination of patient, obtaining history from patient or surrogate, ordering and performing treatments and interventions, ordering and review of laboratory studies, ordering and review of radiographic studies, pulse oximetry and re-evaluation of patient's condition.   Length of Stay: 5  Ezra Shuck, MD  05/06/2024, 7:43 AM  Advanced Heart Failure Team Pager [phone] (M-F; 7a - 5p)   Please visit [email or web address]: For overnight coverage please call cardiology fellow first. If fellow not available call Shock/ECMO MD on call.  For ECMO / Mechanical Support (Impella, IABP, LVAD) issues call Shock / ECMO MD on call.

## 2024-05-06 NOTE — Plan of Care (Signed)
" °  Problem: Cardiac: Goal: Ability to maintain an adequate cardiac output Outcome: Progressing   Problem: Nutritional: Goal: Will attain and maintain optimal nutritional status Outcome: Progressing   Problem: Neurological: Goal: Will regain or maintain usual level of consciousness Outcome: Progressing   "

## 2024-05-06 NOTE — IPAL (Signed)
" °  Interdisciplinary Goals of Care Family Meeting   Date carried out:: 05/06/2024  Location of the meeting: Bedside  Member's involved: Physician, Family Member or next of kin, and Palliative care team member  Durable Power of Attorney or acting medical decision maker: wife, patient    Discussion: We discussed goals of care for Bryan Craig .  I met with Bryan Craig wife and daughter with Dr. Oneita, then with Bryan Craig as well. We discussed his progress and ability to progress outside the ICU. We reviewed expected course over the coming weeks, but at present he is not a candidate for inpatient hospice since he can tolerate oral meds. Family is interested in home hospice support and care with plans for eventual transfer to inpatient hospice facilitate when he no longer can have his symptoms controlled at home. All questions were answered. PT consulted to help evaluate for possible need for adaptive equipment at home-- he was very functional and active PTA.   Code status: Limited Code or DNR with short term  Disposition: Home with Hospice   Time spent for the meeting: 45 min.  Bryan Craig 05/06/2024, 3:38 PM  "

## 2024-05-06 NOTE — Progress Notes (Signed)
 "  NAME:  Bryan Craig, MRN:  [id number], DOB:  [birth year], LOS: 5 ADMISSION DATE:  05/01/2024, CONSULTATION DATE:  05/03/24 REFERRING MD:  AHF, CHIEF COMPLAINT:  chest pain   History of Present Illness:  74 year old man with known biliary dilation and elevated LFTs presenting for OP EUS and possible ERCP 1/5 abandoned due to severe ulceration of gastric body and antrum; this area was biopsied and patient admitted for observation and IR consult.  Unfortunately yesterday developed chest pain, dizziness and diagnosed with NSTEMI.   Came to [facility] and had LHC today (assuring no GIB post biopsy) with findings of occluded occluded ramus and high grade LAD narrow, latter was stented; 2 hours later had VTACH with redo LHC showing in-stent thrombosis. This procedure complicated by recurrent runs of VT that have since ablated; IABP placed and patient admitted to [facility] on multiple pressors.  As patient was on TRH service he is now on PCCM service.  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  Pertinent  Medical History  CAD HLD HTN Retinal detachment  Significant Hospital Events: Including procedures, antibiotic start and stop dates in addition to other pertinent events   1/5: admitted for NSTEMI  s/p PCI and IABP on 1/7 1/8: weaning IABP, discussions with family transitioned to DNR/I   Interim History / Subjective:  Feels well today. Likes having foley bc he had trouble urinating in the hospital. Was able to  walk to the bathroom.   Objective    Blood pressure 118/86, pulse 74, temperature 98.6 F (37 C), temperature source Oral, resp. rate 14, height 5' 7 (1.702 m), weight (P) 67.6 kg, SpO2 97%. PAP: (22-29)/(6-13) 29/11 CVP:  [0 mmHg-8 mmHg] 1 mmHg      Intake/Output Summary (Last 24 hours) at 05/06/2024 1353 Last data filed at 05/06/2024 1200 Gross per 24 hour  Intake 663.64 ml  Output 1700 ml  Net -1036.36 ml   Filed Weights   05/01/24 0659 05/02/24 1121  05/05/24 0630  Weight: 66.7 kg 68.4 kg (P) 67.6 kg    Examination: General: elderly man siting up in bed in NAD HENT: [place]/AT, eyes anicteric Lungs: breathing comfortably on RA, CTAB Cardiovascular: S1S2, RRR Abdomen: soft, NT. Bilious output from RUQ drain. Extremities: warm, dry, no significant edema Neuro: awake, alert, ambulating with minimal assistance  GU: foley with amber urine    Na+ 130 BUN 20 Cr 0.46 WBC 11.7 H/H 8.7/29.6 Platelets 359  Resolved problem list   Assessment and Plan   NSTEMI s/p LAD stenting early afternoon 1/7 c/b in-stent thrombosis manifesting as VT storm later afternoon 1/7 Acute HFrEF with cardiogenic shock; Echo with EF 20-25%, mild LVH, G1DD, normal RV Ventricular tachycardia New diagnosis of 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- s/p IR guided biliary drain before LHC above, onc consulted 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 Hyponatremia, mild likely due to hypervolemia - monitor Lactic acidosis - trending down Urinary retention -d/c Aline, CVC -con't foley at pateitn's request -con't DAPT-- needs prasugrel  for previous in-stent stenosis -statin -GDMT as tolerated; with his cancer-associated prognosis, this is not as big of a priority -Palliative care consulted-- see ipal note. D/w wife, daughter, and patient-- planning for home with hospice. -ok to transfer out of ICU to floor; TRH to assume care tomorrow    Labs   CBC: Recent Labs  Lab 05/02/24 0438 05/03/24 0745 05/03/24 1727 05/03/24 1751 05/04/24 0255 05/05/24 0457 05/06/24 0436  WBC 6.0  10.3  --   --  14.5* 12.0* 11.7*  NEUTROABS  --  8.4*  --   --   --   --   --   HGB 8.7* 10.4* 11.6* 11.6* 9.8* 8.8* 8.7*  HCT 29.3* 35.1* 34.0* 34.0* 32.7* 29.7* 29.6*  MCV 67.0* 68.8*  --   --  68.6* 69.6* 68.8*  PLT 479* 565*  --   --  566* 350 359    Basic Metabolic Panel: Recent Labs  Lab 05/02/24 0020 05/02/24 0438  05/03/24 0745 05/03/24 1727 05/03/24 1751 05/04/24 0255 05/05/24 0457 05/06/24 0436  NA 136 135 133* 134* 134* 132* 132* 130*  K 4.1 4.1 4.5 4.0 3.9 3.7 3.3* 3.8  CL 103 102 100  --   --  99 99 98  CO2 24 25 24   --   --  26 25 26   GLUCOSE 140* 137* 122*  --   --  154* 102* 122*  BUN 13 12 8   --   --  17 15 20   CREATININE 0.70 0.62 0.59*  --   --  0.62 0.49* 0.46*  CALCIUM  8.8* 8.2* 8.6*  --   --  7.7* 7.7* 7.8*  MG 2.2  --  2.3  --   --  2.0 2.3 2.3  PHOS  --   --   --   --   --  2.3* 2.1* 2.6   GFR: Estimated Creatinine Clearance: 75.7 mL/min (A) (by C-G formula based on SCr of 0.46 mg/dL (L)). Recent Labs  Lab 05/03/24 0745 05/03/24 1726 05/03/24 2009 05/03/24 2323 05/04/24 0124 05/04/24 0255 05/04/24 0800 05/05/24 0457 05/06/24 0436  WBC 10.3  --   --   --   --  14.5*  --  12.0* 11.7*  LATICACIDVEN  --    < > 4.4* 2.7* 2.0*  --  1.5  --   --    < > = values in this interval not displayed.     Critical care time:     Leita SHAUNNA Gaskins, DO 05/06/2024 3:36 PM [facility]  For contact information, see Amion. If no response to pager, please call PCCM 2H APP. After hours, 7PM- 7AM, please call on call APP for 2H.      "

## 2024-05-07 DIAGNOSIS — Z66 Do not resuscitate: Secondary | ICD-10-CM

## 2024-05-07 LAB — CBC
HCT: 29.5 % — ABNORMAL LOW (ref 39.0–52.0)
Hemoglobin: 8.8 g/dL — ABNORMAL LOW (ref 13.0–17.0)
MCH: 20.6 pg — ABNORMAL LOW (ref 26.0–34.0)
MCHC: 29.8 g/dL — ABNORMAL LOW (ref 30.0–36.0)
MCV: 68.9 fL — ABNORMAL LOW (ref 80.0–100.0)
Platelets: 448 K/uL — ABNORMAL HIGH (ref 150–400)
RBC: 4.28 MIL/uL (ref 4.22–5.81)
RDW: 24.8 % — ABNORMAL HIGH (ref 11.5–15.5)
WBC: 8.5 K/uL (ref 4.0–10.5)
nRBC: 0 % (ref 0.0–0.2)

## 2024-05-07 LAB — COMPREHENSIVE METABOLIC PANEL WITH GFR
ALT: 107 U/L — ABNORMAL HIGH (ref 0–44)
AST: 43 U/L — ABNORMAL HIGH (ref 15–41)
Albumin: 2.6 g/dL — ABNORMAL LOW (ref 3.5–5.0)
Alkaline Phosphatase: 423 U/L — ABNORMAL HIGH (ref 38–126)
Anion gap: 7 (ref 5–15)
BUN: 17 mg/dL (ref 8–23)
CO2: 26 mmol/L (ref 22–32)
Calcium: 7.9 mg/dL — ABNORMAL LOW (ref 8.9–10.3)
Chloride: 101 mmol/L (ref 98–111)
Creatinine, Ser: 0.59 mg/dL — ABNORMAL LOW (ref 0.61–1.24)
GFR, Estimated: >60 mL/min
Glucose, Bld: 91 mg/dL (ref 70–99)
Potassium: 4 mmol/L (ref 3.5–5.1)
Sodium: 133 mmol/L — ABNORMAL LOW (ref 135–145)
Total Bilirubin: 1 mg/dL (ref 0.0–1.2)
Total Protein: 4.9 g/dL — ABNORMAL LOW (ref 6.5–8.1)

## 2024-05-07 LAB — PHOSPHORUS: Phosphorus: 2.6 mg/dL (ref 2.5–4.6)

## 2024-05-07 LAB — MAGNESIUM: Magnesium: 2.1 mg/dL (ref 1.7–2.4)

## 2024-05-07 MED ORDER — AMIODARONE HCL 200 MG PO TABS: 200.0000 mg | ORAL_TABLET | Freq: Every day | ORAL | 1 refills | Status: AC

## 2024-05-07 MED ORDER — DIGOXIN 125 MCG PO TABS: 0.1250 mg | ORAL_TABLET | Freq: Every day | ORAL | 1 refills | Status: AC

## 2024-05-07 MED ORDER — PANTOPRAZOLE SODIUM 40 MG PO TBEC: 40.0000 mg | DELAYED_RELEASE_TABLET | Freq: Every day | ORAL | 1 refills | Status: AC

## 2024-05-07 NOTE — Progress Notes (Signed)
 Mobility Specialist: Progress Note   05/07/24 1331  Mobility  Activity Ambulated with assistance  Level of Assistance Contact guard assist, steadying assist  Assistive Device Front wheel walker  Distance Ambulated (ft) 150 ft  Activity Response Tolerated well  Mobility Referral Yes  Mobility visit 1 Mobility  Mobility Specialist Start Time (ACUTE ONLY) 1246  Mobility Specialist Stop Time (ACUTE ONLY) 1310  Mobility Specialist Time Calculation (min) (ACUTE ONLY) 24 min    Pt received in chair, him ad family eager for mobility session. CGA throughout. No complaints, tolerated very well. SpO2 97-98% on RA, HR 89-91. Returned to room. Left in chair with all needs met, call bell in reach.   Ileana Lute Mobility Specialist Please contact via SecureChat or Rehab office at [phone]

## 2024-05-07 NOTE — TOC Progression Note (Signed)
 Transition of Care [facility]) - Progression Note    Patient Details  Name: Bryan Craig MRN: [id number] Date of Birth: 11/08/1950  [facility]) CM/SW Contact  Robynn Eileen Hoose, RN Phone Number: 05/07/2024, 8:18 AM  Clinical Narrative:   Secure message from Palliative medicine regarding patient and family wanting to use [facility] for home with Hospice care. Referral sent to [facility] with [facility] by phone call and added to secure chat.      Barriers to Discharge: Continued Medical Work up               Expected Discharge Plan and Services                                               Social Drivers of Health (SDOH) Interventions SDOH Screenings   Food Insecurity: No Food Insecurity (05/01/2024)  Housing: Low Risk (05/01/2024)  Transportation Needs: No Transportation Needs (05/01/2024)  Utilities: Not At Risk (05/01/2024)  Alcohol Screen: Low Risk (11/30/2023)  Depression (PHQ2-9): Low Risk (11/30/2023)  Financial Resource Strain: Low Risk (11/30/2023)  Physical Activity: Sufficiently Active (11/30/2023)  Social Connections: Moderately Isolated (05/01/2024)  Stress: No Stress Concern Present (11/30/2023)  Tobacco Use: Low Risk (05/01/2024)  Health Literacy: Adequate Health Literacy (11/30/2023)    Readmission Risk Interventions     No data to display

## 2024-05-07 NOTE — Progress Notes (Signed)
" ° °  Palliative Medicine Inpatient Follow Up Note   HPI: Bryan Craig is a 74 year old male with a past medical history of coronary artery disease, hyperlipidemia, hypertension, anemia, and myocardial infarction.  Admitted in the setting of cardiogenic shock required pressor therapy in the ICU.  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  Conversations were held yesterday with both the critical care team and the palliative care team.  Patient's family have elected for him to discharge home with hospice care.  Palliative care remains involved to help with goals of care conversations and discharge planning.  Today's Discussion 05/07/2024  I reviewed the chart notes including nursing notes from [facility], progress notes from Dr. Oneita, Dr. Rolan, and Dr. Gretta. I also reviewed vital signs, nursing flowsheets, medication administrations record, labs, and imaging.    Oral Intake %:  0% I/O:  (-)685 ml Bowel Movements:  Last 1/10 Mobility: Limited d/t weakness though had been ambulatory preceding hospitalization.   I met with Bryan Craig and his daught we reviewed the conversations which were held yesterday this morning.  We discussed the goal to transition Bryan Craig home sooner than later with hospice support.  Patient's daughter inquires as to how long this may take as she and her family are anxious to get Bryan Craig home sooner than later.  I shared that I would go ahead and follow-up with the primary team and the transitions of care team to support this occurring.  Bryan Craig this morning is in good spirits. Created space and opportunity for patient to explore thoughts feelings and fears regarding current medical situation. He denies pain, shortness of breath, nausea.  He shares the hope and goal to get home.  Transitions of care is helping to support a referral to [facility] [place] so that home DME may be delivered and Bryan Craig discharged.  Questions and  concerns addressed/Palliative Support Provided.   Objective Assessment: Vital Signs Vitals:   05/07/24 0455 05/07/24 0900  BP: 110/70 105/67  Pulse: 76   Resp: 16   Temp: 98.4 F (36.9 C)   SpO2: 99% 97%    Intake/Output Summary (Last 24 hours) at 05/07/2024 1146 Last data filed at 05/07/2024 0600 Gross per 24 hour  Intake 170.62 ml  Output 1670 ml  Net -1499.38 ml   Last Weight  Most recent update: 05/05/2024  7:12 AM    Weight  (P)  67.6 kg (149 lb 0.5 oz)            Gen: Chronically ill-appearing elderly male HEENT: moist mucous membranes CV: Regular rate and rhythm  PULM: On room air breathing is even and nonlabored ABD: soft/nontender  EXT: No edema  Neuro: Alert and oriented, able to follow commands easily  SUMMARY OF RECOMMENDATIONS   DNAR/DNI  Continue current measures  Appreciate transitions of care team working with [facility] [place] to secure DME  Plan for transition home as early as today if all care coordination can be completed  The palliative care team will follow as needed  ______________________________________________________________________________________ Bryan Craig Bryan Craig Palliative Medicine Team Team Cell Phone: [phone] Please utilize secure chat with additional questions, if there is no response within 30 minutes please call the above phone number  I personally spent a total of 32 minutes in the care of the patient today including preparing to see the patient, performing a medically appropriate exam/evaluation, referring and communicating with other health care professionals, and coordinating care.     "

## 2024-05-07 NOTE — Discharge Summary (Signed)
 Physician Discharge Summary  Bryan Craig FMW:[id number] DOB: 08-16-1950  PCP: Dorina Loving, PA-C  Admitted from: Home Discharged to: Home with [facility]  Admit date: 05/01/2024 Discharge date: 05/07/2024  Recommendations for Outpatient Follow-up:    Follow-up Information     Saguier, Edward, PA-C. Schedule an appointment as soon as possible for a visit.   Specialties: Internal Medicine, Family Medicine Why: Patient is being discharged home with hospice. Contact information: [address] [place] [address] [phone]         Dr Avelina Gianotti Follow up.   Why: MD to follow-up for hospice needs. 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  Unfortunately then developed chest pain, dizziness and diagnosed with NSTEMI.   Transferred [facility] to [facility] and had LHC 1/7 (assuring no GIB post biopsy) with findings of occluded occluded ramus and high grade LAD narrow, latter was stented; 2  hours later had VTACH with redo LHC showing in-stent thrombosis. 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  1/10: PCCM and palliative care MDs had goals of discussion with patient and family (spouse and daughter) and they opted for DC home hospice (presently not candidate for inpatient hospice) with plans for eventual transfer to inpatient hospice when he no longer can have his symptoms controlled at home.    Care was transferred by CCM from ICU to floor on 1/10 and to [facility] on 1/11.  Significant events: 1/5: admitted for NSTEMI  s/p PCI and IABP on 1/7 1/8: weaning IABP, discussions with family transitioned to DNR/I    Assessment and plan:  CAD, NSTEMI s/p LAD stenting early afternoon 1/7 c/b in-stent thrombosis manifesting as VT storm later afternoon 1/7 Acute HFrEF with cardiogenic shock, ischemic cardiomyopathy; Echo with EF 20-25%, mild LVH, G1DD, normal RV.  Shock resolved. Ventricular tachycardia New diagnosis of 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- s/p IR guided biliary drain before LHC above, onc consulted and given very poor prognosis, in essence recommended comfort care. 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  Stable. Lactic acidosis -resolved. Urinary retention  - On day of discharge, Dr.  Rolan, Cardiology evaluated and cleared for DC home on aspirin , Effient , amiodarone , atorvastatin  and digoxin . - No anginal symptoms.  In fact no pain reported anywhere.  Has not received pain meds for more than 48 hours and even then it was a single dose.  Clinically euvolemic.  Not on diuretics. - Continue percutaneous biliary drain at discharge.  LFTs steadily improving. - Foley catheter is being  continued at discharge at patient's and family's request and for comfort.    Consultations: Cardiology Interventional radiology Medical oncology Advanced heart failure cardiology PCCM Palliative care medicine  Procedures: As noted above   Discharge Instructions  Discharge Instructions     Call MD for:  difficulty breathing, headache or visual disturbances   Complete by: As directed    Call MD for:  extreme fatigue   Complete by: As directed    Call MD for:  persistant dizziness or light-headedness   Complete by: As directed    Call MD for:  persistant nausea and vomiting   Complete by: As directed    Call MD for:  severe uncontrolled pain   Complete by: As directed    Call MD for:  temperature >100.4   Complete by: As directed    Diet general   Complete by: As directed    Discharge instructions   Complete by: As directed    Patient is discharging home with indwelling Foley catheter and percutaneous biliary drain.   Increase activity slowly   Complete by: As directed    No wound care   Complete by: As directed         Medication List     STOP taking these medications    metoprolol  succinate 50 MG 24 hr tablet Commonly known as: TOPROL -XL   SPS (Sodium Polystyrene Sulf) 15 GM/60ML suspension Generic drug: sodium polystyrene   telmisartan  40 MG tablet Commonly known as: MICARDIS        TAKE these medications    amiodarone  200 MG tablet Commonly known as: PACERONE  Take 1 tablet (200 mg total) by mouth daily. Start taking on: May 08, 2024   Aspirin  Low Dose 81 MG tablet Generic drug: aspirin  EC Take 1 tablet (81 mg total) by mouth daily. Swallow whole.   atorvastatin  80 MG tablet Commonly known as: LIPITOR Tome 1 tableta (80 mg en total) por va oral antes de acostarse. (Take 1 tablet (80 mg total) by mouth at bedtime.)   digoxin  0.125 MG tablet Commonly known as: LANOXIN  Take 1 tablet (0.125 mg total) by mouth daily. Start taking on:  May 08, 2024   ezetimibe  10 MG tablet Commonly known as: ZETIA  TAKE 1 TABLET BY MOUTH EVERY DAY   FeroSul 325 (65 Fe) MG tablet Generic drug: ferrous sulfate  Take 1 tablet by mouth 3 (three) times daily. What changed: when to take this   ondansetron  4 MG tablet Commonly known as: Zofran  Take 1 tablet (4 mg total) by mouth every 8 (eight) hours as needed for nausea or vomiting.   pantoprazole  40 MG tablet Commonly known as: Protonix  Take 1 tablet (40 mg total) by mouth daily.   prasugrel  10 MG Tabs tablet Commonly known as: EFFIENT  Tome 1 tableta (10 mg en total) por va oral diariamente. (Take 1 tablet (10 mg total) by mouth daily.)   prednisoLONE acetate 1 % ophthalmic suspension Commonly known as: PRED FORTE Place 1 drop into the left eye daily.   Skyrizi Pen 150 MG/ML pen Generic drug: risankizumab-rzaa Inject into the skin every 3 (  three) months. 1 dose every 3 months   triamcinolone  cream 0.1 % Commonly known as: KENALOG  Apply 1 Application topically 2 (two) times daily. What changed:  when to take this reasons to take this       Allergies[1]    Procedures/Studies: ECHOCARDIOGRAM COMPLETE Result Date: 05/04/2024    ECHOCARDIOGRAM REPORT   Patient Name:   Bryan Craig Date of Exam: 05/04/2024 Medical Rec #:  [id number]     Height:       67.0 in Accession #:    [id number]    Weight:       150.8 lb Date of Birth:  03-25-1951     BSA:          1.794 m Patient Age:    73 years      BP:           118/86 mmHg Patient Gender: M             HR:           80 bpm. Exam Location:  Inpatient Procedure: 2D Echo, Cardiac Doppler, Color Doppler and Intracardiac            Opacification Agent (Both Spectral and Color Flow Doppler were            utilized during procedure). Indications:    I50.40* Unspecified combined systolic (congestive) and diastolic                 (congestive) heart failure  History:        Patient has prior history of Echocardiogram examinations, most                  recent 05/02/2024. CHF, CAD and Acute MI, Signs/Symptoms:Chest                 Pain; Risk Factors:Hypertension and Dyslipidemia.  Sonographer:    Ellouise Mose RDCS Referring Phys: [id number] GORDY BERGAMO  Sonographer Comments: Technically difficult study due to poor echo windows, suboptimal subcostal window and suboptimal parasternal window. Patient in trendelenburg position. Balloon pump present. IMPRESSIONS  1. Left ventricular ejection fraction, by estimation, is 20 to 25%. Left ventricular ejection fraction by 2D MOD biplane is 27.6 %. The left ventricle has severely decreased function. The left ventricle demonstrates regional wall motion abnormalities (see scoring diagram/findings for description). There is mild asymmetric left ventricular hypertrophy of the basal-septal segment. Left ventricular diastolic parameters are consistent with Grade I diastolic dysfunction (impaired relaxation).  2. Right ventricular systolic function is normal. The right ventricular size is normal.  3. The mitral valve is normal in structure. No evidence of mitral valve regurgitation. No evidence of mitral stenosis.  4. The aortic valve is tricuspid. There is moderate calcification of the aortic valve. Aortic valve regurgitation is not visualized. Aortic valve sclerosis is present, with no evidence of aortic valve stenosis.  5. IABP seen in distal aortic arch. Comparison(s): Changes from prior study are noted. The left ventricular function is worsened. LVEF has worsened compared to prior study, with wall motion abnormalities as noted. Conclusion(s)/Recommendation(s): No left ventricular mural or apical thrombus/thrombi. FINDINGS  Left Ventricle: Left ventricular ejection fraction, by estimation, is 20 to 25%. Left ventricular ejection fraction by 2D MOD biplane is 27.6 %. The left ventricle has severely decreased function. The left ventricle demonstrates regional wall motion abnormalities. Definity  contrast agent was given IV to delineate  the left ventricular endocardial borders. The left ventricular internal cavity size was normal in size. There is  mild asymmetric left ventricular hypertrophy of the basal-septal segment. Left ventricular diastolic parameters are consistent with Grade I diastolic dysfunction (impaired relaxation).  LV Wall Scoring: The mid and distal anterior wall, mid and distal lateral wall, mid anterolateral segment, apical septal segment, and apex are akinetic. The anterior septum, basal inferolateral segment, basal anterolateral segment, mid inferoseptal segment, basal anterior segment, and basal inferoseptal segment are hypokinetic. The entire inferior wall is normal. Right Ventricle: The right ventricular size is normal. No increase in right ventricular wall thickness. Right ventricular systolic function is normal. Left Atrium: Left atrial size was normal in size. Right Atrium: Right atrial size was normal in size. Pericardium: There is no evidence of pericardial effusion. Mitral Valve: The mitral valve is normal in structure. No evidence of mitral valve regurgitation. No evidence of mitral valve stenosis. Tricuspid Valve: The tricuspid valve is normal in structure. Tricuspid valve regurgitation is trivial. No evidence of tricuspid stenosis. Aortic Valve: The aortic valve is tricuspid. There is moderate calcification of the aortic valve. Aortic valve regurgitation is not visualized. Aortic valve sclerosis is present, with no evidence of aortic valve stenosis. Pulmonic Valve: The pulmonic valve was not well visualized. Pulmonic valve regurgitation is not visualized. No evidence of pulmonic stenosis. Aorta: IABP seen in distal aortic arch. The ascending aorta was not well visualized and the aortic root is normal in size and structure. Venous: The inferior vena cava was not well visualized. IAS/Shunts: No atrial level shunt detected by color flow Doppler. Additional Comments: There is a small pleural effusion in the left lateral  region.  LEFT VENTRICLE PLAX 2D                        Biplane EF (MOD) LVIDd:         4.80 cm         LV Biplane EF:   Left LVIDs:         4.10 cm                          ventricular LV PW:         1.10 cm                          ejection LV IVS:        1.30 cm                          fraction by LVOT diam:     2.30 cm                          2D MOD LV SV:         72                               biplane is LV SV Index:   40                               27.6 %. LVOT Area:     4.15 cm                                Diastology  LV e' medial:    3.70 cm/s LV Volumes (MOD)               LV E/e' medial:  17.6 LV vol d, MOD    123.0 ml      LV e' lateral:   11.20 cm/s A2C:                           LV E/e' lateral: 5.8 LV vol d, MOD    109.0 ml A4C: LV vol s, MOD    79.6 ml A2C: LV vol s, MOD    88.6 ml A4C: LV SV MOD A2C:   43.4 ml LV SV MOD A4C:   109.0 ml LV SV MOD BP:    32.2 ml RIGHT VENTRICLE             IVC RV S prime:     15.90 cm/s  IVC diam: 1.80 cm TAPSE (M-mode): 1.5 cm LEFT ATRIUM           Index       RIGHT ATRIUM          Index LA diam:      3.30 cm 1.84 cm/m  RA Area:     9.60 cm LA Vol (A2C): 11.4 ml 6.36 ml/m  RA Volume:   20.10 ml 11.21 ml/m LA Vol (A4C): 15.9 ml 8.87 ml/m  AORTIC VALVE LVOT Vmax:   110.00 cm/s LVOT Vmean:  73.900 cm/s LVOT VTI:    0.173 m  AORTA Ao Root diam: 2.75 cm MITRAL VALVE MV Area (PHT): 3.60 cm    SHUNTS MV Decel Time: 211 msec    Systemic VTI:  0.17 m MV E velocity: 65.00 cm/s  Systemic Diam: 2.30 cm MV A velocity: 57.60 cm/s MV E/A ratio:  1.13 Shelda Bruckner MD Electronically signed by Shelda Bruckner MD Signature Date/Time: 05/04/2024/1:45:19 PM    Final    DG Chest Port 1 View Result Date: 05/03/2024 EXAM: 1 VIEW(S) XRAY OF THE CHEST 05/03/2024 11:38:40 PM COMPARISON: 05/02/2024 CLINICAL HISTORY: Encounter for central line placement [id number] FINDINGS: LINES, TUBES AND DEVICES: Left internal jugular central venous  catheter in place with tip overlying the right atrium. Swan-Ganz catheter in place with tip overlying the right lower lobe pulmonary artery. Intra-aortic balloon pump marker overlying the aortic arch. Right upper quadrant pigtail drainage catheter noted. LUNGS AND PLEURA: Low lung volumes. Bibasilar atelectasis. No pleural effusion. No pneumothorax. HEART AND MEDIASTINUM: Coronary artery stent noted. No acute abnormality of the cardiac and mediastinal silhouettes. BONES AND SOFT TISSUES: No acute osseous abnormality. IMPRESSION: 1. Left internal jugular central venous catheter, Swan-Ganz catheter, and intra-aortic balloon pump in appropriate positions. 2. Low lung volumes and bibasilar atelectasis. Electronically signed by: Elsie Gravely MD 05/03/2024 11:41 PM EST RP Workstation: [id number]   CARDIAC CATHETERIZATION Addendum Date: 05/03/2024 Cardiac Catheterization 05/03/2024: Hemodynamic data: RA 12/11, mean 11 mmHg. RV 30/6, EDP 10 mmHg PA 31/15, mean 21 mmHg PW 22/29, mean 23 mmHg. QP/QS 1.0.  CO 4.81, CI 2.69 by Fick.  Papi 1.5. Angiographic data: LAD is occluded in the ostium. Intervention data: Successful balloon angioplasty with 3.0 x 20 mm Emerge balloon into the ostial and also proximal LAD revealing thrombotic occlusion of the stent itself, heavy thrombus was evident which cleared up with intracoronary Aggrastat .  0% residual stenosis with improvement in TIMI flow from 0-3. There was also a distal Cx occlusion at the beginning of the procedure again suggesting  hypercoagulable state, this resolved with intracoronary Aggrastat  infusion. Impression and recommendations: Intra-aortic balloon pump placed, patient's hemodynamics improved, transferred to intensive care unit for further management.  Patient will need to be on DAPT without interruption as he is hypercoagulable probably due to his underlying malignancy.  Extremely difficult situation.  Result Date: 05/03/2024 Images from the original result were  not included. Cardiac Catheterization 05/03/2024: Hemodynamic data: RA 12/11, mean 11 mmHg. RV 30/6, EDP 10 mmHg PA 31/15, mean 21 mmHg PW 22/29, mean 23 mmHg. QP/QS 1.0.  CO 4.81, CI 2.69 by Fick.  Papi 1.5. Angiographic data: LAD is occluded in the ostium. Intervention data: Successful balloon angioplasty with 3.0 x 20 mm Emerge balloon into the ostial and also proximal LAD revealing thrombotic occlusion of the stent itself, heavy thrombus was evident which cleared up with intracoronary Aggrastat .  0% residual stenosis with improvement in TIMI flow from 0-3. Impression and recommendations: Intra-aortic balloon pump placed, patient's hemodynamics improved, transferred to intensive care unit for further management.  Patient will need to be on DAPT without interruption as he is hypercoagulable probably due to his underlying malignancy.  Extremely difficult situation.   CARDIAC CATHETERIZATION Result Date: 05/03/2024 Images from the original result were not included. Cardiac Catheterization 05/03/2024: Hemodynamic data: LVEDP 12 mmHg.  No pressure gradient across aortic valve. Angiographic data: LM: Large-caliber vessel, smooth and normal. LAD: Is a very large caliber vessel giving origin to a large D1 which is secondary branches with ostial 20 to 30% stenosis.  Proximal LAD prior to D1 bifurcation has a ulcerated and thrombotic 90% stenosis, and has a tandem 60 to 70% stenosis. LCx: Large-caliber vessel, ostial 50% stenosis, mid 30% stenosis.  Gives origin to large OM1 and a moderate-sized OM 2. RI: It is occluded in the proximal segment at previously stented segment.  Appears to be old. Intervention data: After collegial discussions, I suspect to proceed with aspiration thrombectomy and low-pressure balloon inflation at the ulcerated and thrombotic proximal and mid LAD.  Unfortunately following the procedure, patient had significant of organized white thrombus aspirated and had a very unstable lesion that occluded  hence had to be stented with a 3.0 x 38 mm Synergy XD DES at 20 atmospheric pressure.  Slight stepup and stepdown were evident in the stented segment with excellent TIMI-3 flow without evidence of edge dissection. Impression and recommendations: Patient will need DAPT for 1 year however I tried to not to stent the lesion in view of his malignancy and probable further need for GI workup.  Unfortunately without intervention to the LAD, it would have been lethal for the patient as he would have probably occluded the LAD at some point during the hospitalization in view of thrombotic state.  Patient transferred to the floor in a stable condition.   IR BILIARY DRAIN PLACEMENT WITH CHOLANGIOGRAM Result Date: 05/03/2024 INDICATION: Aborted ERCP. Briefly, 74 year old male with distal CBD obstruction, suspected malignant. 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 EXAM: Procedures; 1. PERCUTANEOUS TRANSHEPATIC CHOLANGIOGRAM 2. ULTRASOUND AND FLUOROSCOPIC GUIDED BILIARY DRAINAGE TUBE PLACEMENT COMPARISON:  CT AP, 05/02/2023.  MRCP, 03/28/2024. MEDICATIONS: Rocephin  2 gm IV; The antibiotic was administered with an appropriate time frame prior to the initiation of the procedure CONTRAST:  20 mL Omnipaque  300 - administered into the biliary tree. ANESTHESIA/SEDATION: Moderate (conscious) sedation was employed during this procedure. A total of Versed  1 mg and Fentanyl  50 mcg was administered intravenously. Moderate Sedation Time: 21 minutes. The patient's level of consciousness and vital signs were monitored  continuously by radiology nursing throughout the procedure under my direct supervision. FLUOROSCOPY: Radiation Exposure Index and estimated peak skin dose (PSD); Reference air kerma (RAK), 8 mGy. COMPLICATIONS: None immediate. TECHNIQUE: Informed written consent was obtained from the patient and/or patient's representative after a discussion of the risks, benefits and alternatives to treatment. Questions regarding  the procedure were encouraged and answered. A timeout was performed prior to the initiation of the procedure. The RIGHT upper abdominal quadrant was prepped and draped in the usual sterile fashion, and a sterile drape was applied covering the operative field. Maximum barrier sterile technique with sterile gowns and gloves were used for the procedure. A timeout was performed prior to the initiation of the procedure. Ultrasound scanning of the right upper abdominal quadrant was performed to delineate the anatomy and avoid transgression of the gallbladder or the pleura. After the overlying soft tissues were anesthetized with 1% Lidocaine  with epinephrine , under direct ultrasound guidance, a 22 gauge Chiba needle was utilized to cannulate a peripheral aspect of an intrahepatic biliary duct. A RIGHT-sided approach distal to the biliary hilum was targeted. Appropriate position was confirmed with limited contrast injection. Next, the duct was cannulated with a Nitrex wire and dilated with an Accustick set under fluoroscopic guidance. Limited cholangiograms were performed in various obliquities confirming appropriate access. Next, a 4 Fr angled glide catheter was advanced through the outer sheath of the Accustick set and with the use of a stiff Glidewire, advanced through the biliary hilum, common bile duct and ampulla to the level of the duodenum. Contrast injection confirmed appropriate positioning. Under intermittent fluoroscopic guidance and over an Amplatz wire, the track was dilated ultimately allowing placement of a 10 Fr biliary drainage catheter with coil ultimately locked within the duodenum. Contrast was injected and a completion radiographs were obtained in various obliquities. The catheter was connected to a drainage bag which yielded the brisk return of clear bile. The catheter was secured to the skin with an interrupted suture and StatLock device. Dressings were applied. The patient tolerated the procedure  well without immediate postprocedural complication. FINDINGS: *Sonographic evaluation of the liver demonstrates intrahepatic biliary ductal dilatation as was as gallbladder distention. *Under direct ultrasound guidance, a dilated duct distal to the hilum from a RIGHT-sided approach was accessed *Placement of a 10 Fr biliary drainage catheter with end coiled and locked within the duodenum and radiopaque marker located proximal to the level of the biliary hilum. *Limited contrast injection demonstrates dilatation of the biliary tree, with obstruction at the distal the CBD. *30 mL of bile was submitted for microbiological analysis. IMPRESSION: 1. Successful placement of a 10 Fr percutaneous biliary drainage catheter via a RIGHT transhepatic approach, with end coiled and locked within the duodenum. 2. Distal common bile duct obstruction, with shouldering suspicious for malignancy. Bile submitted for microbiological analysis. RECOMMENDATIONS: The patient will return to Vascular Interventional Radiology (VIR) for routine drainage catheter evaluation and exchange in 8 weeks. Thom Hall, MD [facility] Electronically Signed   By: Thom Hall M.D.   On: 05/03/2024 10:34   DG CHEST PORT 1 VIEW Result Date: 05/02/2024 CLINICAL DATA:  Chest pain. Status post upper endoscopy and endoscopic ultrasound yesterday. EXAM: PORTABLE CHEST 1 VIEW COMPARISON:  01/24/2023. FINDINGS: Normal-sized heart. Decreased inspiration with mild bibasilar linear atelectasis. Remainder of the lungs are clear. No pneumothorax or pneumoperitoneum seen. Mild thoracolumbar spine degenerative changes. IMPRESSION: Decreased inspiration with mild bibasilar linear atelectasis. Electronically Signed   By: Elspeth Bathe  M.D.   On: 05/02/2024 16:45   ECHOCARDIOGRAM COMPLETE Result Date: 05/02/2024    ECHOCARDIOGRAM REPORT   Patient Name:   Bryan Craig Date of Exam: 05/02/2024 Medical Rec #:   [id number]     Height:       67.0 in Accession #:    [id number]    Weight:       150.8 lb Date of Birth:  11-Dec-1950     BSA:          1.794 m Patient Age:    73 years      BP:           155/129 mmHg Patient Gender: M             HR:           82 bpm. Exam Location:  Inpatient Procedure: 2D Echo, Cardiac Doppler, Color Doppler and Intracardiac            Opacification Agent (Both Spectral and Color Flow Doppler were            utilized during procedure). Indications:    NSTEMI I21.4  History:        Patient has prior history of Echocardiogram examinations, most                 recent 06/15/2023. CAD; Risk Factors:Hypertension.  Sonographer:    Gwendolynn Blush RDCS Referring Phys: 61 BRIAN S CRENSHAW IMPRESSIONS  1. Left ventricular ejection fraction, by estimation, is 40 to 45%. The left ventricle has mildly decreased function. The left ventricle demonstrates regional wall motion abnormalities (see scoring diagram/findings for description). Left ventricular diastolic parameters are consistent with Grade I diastolic dysfunction (impaired relaxation).  2. Right ventricular systolic function is normal. The right ventricular size is normal.  3. The mitral valve is grossly normal. Mild to moderate mitral valve regurgitation. No evidence of mitral stenosis.  4. The aortic valve was not well visualized. Aortic valve regurgitation is trivial. No aortic stenosis is present. Comparison(s): No significant change from prior study. Prior images reviewed side by side. FINDINGS  Left Ventricle: Left ventricular ejection fraction, by estimation, is 40 to 45%. The left ventricle has mildly decreased function. The left ventricle demonstrates regional wall motion abnormalities. Definity  contrast agent was given IV to delineate the left ventricular endocardial borders. The left ventricular internal cavity size was normal in size. There is no left ventricular hypertrophy. Left ventricular diastolic parameters are consistent with Grade I  diastolic dysfunction (impaired relaxation).  LV Wall Scoring: The apical lateral segment, apical anterior segment, and apical inferior segment are akinetic. The posterior wall is hypokinetic. The apical septal segment is normal. Right Ventricle: The right ventricular size is normal. No increase in right ventricular wall thickness. Right ventricular systolic function is normal. Left Atrium: Left atrial size was normal in size. Right Atrium: Right atrial size was normal in size. Pericardium: There is no evidence of pericardial effusion. Mitral Valve: The mitral valve is grossly normal. Mild to moderate mitral valve regurgitation. No evidence of mitral valve stenosis. Tricuspid Valve: The tricuspid valve is normal in structure. Tricuspid valve regurgitation is not demonstrated. No evidence of tricuspid stenosis. Aortic Valve: The aortic valve was not well visualized. Aortic valve regurgitation is trivial. No aortic stenosis is present. Pulmonic Valve: The pulmonic valve was not well visualized. Pulmonic valve regurgitation is not visualized. No evidence of pulmonic stenosis. Aorta: The aortic root and ascending aorta are structurally normal, with no evidence of dilitation. IAS/Shunts: No  atrial level shunt detected by color flow Doppler.  LEFT VENTRICLE PLAX 2D LVIDd:         5.10 cm      Diastology LVIDs:         4.50 cm      LV e' medial:    6.96 cm/s LV PW:         1.00 cm      LV E/e' medial:  9.8 LV IVS:        1.20 cm      LV e' lateral:   6.64 cm/s LVOT diam:     2.00 cm      LV E/e' lateral: 10.3 LV SV:         72 LV SV Index:   40 LVOT Area:     3.14 cm  LV Volumes (MOD) LV vol d, MOD A2C: 162.0 ml LV vol d, MOD A4C: 146.0 ml LV vol s, MOD A2C: 90.1 ml LV vol s, MOD A4C: 84.8 ml LV SV MOD A2C:     71.9 ml LV SV MOD A4C:     146.0 ml LV SV MOD BP:      64.9 ml RIGHT VENTRICLE RV S prime:     14.00 cm/s TAPSE (M-mode): 1.8 cm LEFT ATRIUM             Index        RIGHT ATRIUM           Index LA diam:         3.60 cm 2.01 cm/m   RA Area:     10.10 cm LA Vol (A2C):   46.1 ml 25.70 ml/m  RA Volume:   19.20 ml  10.71 ml/m LA Vol (A4C):   32.9 ml 18.34 ml/m LA Biplane Vol: 40.2 ml 22.41 ml/m  AORTIC VALVE LVOT Vmax:   110.00 cm/s LVOT Vmean:  68.600 cm/s LVOT VTI:    0.230 m  AORTA Ao Root diam: 3.50 cm Ao Asc diam:  3.00 cm MITRAL VALVE               TRICUSPID VALVE MV Area (PHT): 3.86 cm    TR Peak grad:   39.2 mmHg MV E velocity: 68.10 cm/s  TR Vmax:        313.00 cm/s MV A velocity: 98.10 cm/s MV E/A ratio:  0.69        SHUNTS                            Systemic VTI:  0.23 m                            Systemic Diam: 2.00 cm Stanly Leavens MD Electronically signed by Stanly Leavens MD Signature Date/Time: 05/02/2024/3:15:29 PM    Final    CT ABDOMEN PELVIS W CONTRAST Result Date: 05/01/2024 EXAM: CT ABDOMEN AND PELVIS WITH CONTRAST 05/01/2024 04:45:00 PM TECHNIQUE: CT of the abdomen and pelvis was performed with the administration of 80 mL of iohexol  (OMNIPAQUE ) 350 MG/ML injection. Multiplanar reformatted images are provided for review. Automated exposure control, iterative reconstruction, and/or weight-based adjustment of the mA/kV was utilized to reduce the radiation dose to as low as reasonably achievable. COMPARISON: 08/03/2022, 03/28/2024 CLINICAL HISTORY: Biliary obstruction suspected (Ped 0-17y); Per discussion with IR (perform CT-Abdomen as Pancreas Protocol. FINDINGS: LOWER CHEST: No acute abnormality. LIVER: The liver is unremarkable. GALLBLADDER AND  BILE DUCTS: Diffusely dilated and fluid filled gallbladder without wall thickening or radiopaque stones. Similar severe diffuse intrahepatic and extrahepatic biliary ductal dilation with focal short segment narrowing of the mid CBD. No radiopaque choledocholithiasis. SPLEEN: No acute abnormality. 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 ADRENAL GLANDS: No acute abnormality.  KIDNEYS, URETERS AND BLADDER: Small nonobstructive bilateral calyceal calculi. No hydronephrosis in either kidney. No perinephric or periureteral stranding. Urinary bladder is unremarkable. GI AND BOWEL: Moderate wall thickening throughout the stomach. Colonic diverticulosis. No changes of acute diverticulitis. Normal gas filled appendix. Right inguinal hernia containing a short segment of nondistended ileum. No associated bowel obstruction. No extravasation of enteric contrast to suggest bowel perforation. PERITONEUM AND RETROPERITONEUM: No ascites. No free air. VASCULATURE: Aorta is normal in caliber. Retroaortic left renal vein. Diffuse aortoiliac atherosclerosis. LYMPH NODES: No lymphadenopathy. REPRODUCTIVE ORGANS: Severe prostatomegaly. BONES AND SOFT TISSUES: Multilevel degenerative disc disease of the spine. No acute osseous abnormality. No focal soft tissue abnormality. IMPRESSION: 1. No acute intraabdominal or pelvic abnormality. 2. Similar severe, diffuse intrahepatic and extrahepatic biliary ductal dilation with short segment narrowing of the mid CBD. 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 Follow-up ERCP with EUS recommended for the further characterization. 3. Moderate wall thickening throughout the stomach, which may be due to underdistention or gastritis. 4. Scattered colonic diverticulosis. No changes of acute diverticulitis. 5. Severe prostatomegaly. Electronically signed by: Rogelia Myers MD 05/01/2024 06:01 PM EST RP Workstation: [id number]      Subjective: Seen this morning.  Daughter at patient's bedside.  Although Spanish-speaking, patient able to speak fair bit of English to communicate.  Both declined use of video  interpreter.  Patient denies pain, dyspnea or any other complaints.  Had poor appetite yesterday but able to eat somewhat for breakfast this morning.  Eager to discharge home where he lives with his spouse and 2 daughters.  Prior to admission, he was independent.  Discharge Exam:  Vitals:   05/06/24 2005 05/07/24 0020 05/07/24 0455 05/07/24 0900  BP: 104/64 108/66 110/70 105/67  Pulse: 82 76 76   Resp: 16 16 16    Temp: 98.1 F (36.7 C) 98.3 F (36.8 C) 98.4 F (36.9 C)   TempSrc: Oral  Oral Oral  SpO2: 98% 97% 99% 97%  Weight:      Height:        General: Elderly male, moderately built and frail sitting up comfortably in chair without distress. Cardiovascular: S1 & S2 heard, RRR, S1/S2 +. No murmurs, rubs, gallops or clicks. No JVD or pedal edema. Respiratory: Clear to auscultation without wheezing, rhonchi or crackles. No increased work of breathing. Abdominal:  Non distended, non tender & soft. No organomegaly or masses appreciated. Normal bowel sounds heard.  Right percutaneous biliary drain +. GU: Had Foley catheter. CNS: Alert and oriented. No focal deficits. Extremities: no edema, no cyanosis    The results of significant diagnostics from this hospitalization (including imaging, microbiology, ancillary and laboratory) are listed below for reference.     Microbiology: Recent Results (from the past 240 hours)  MRSA Next Gen by PCR, Nasal     Status: None   Collection Time: 05/02/24 11:35 AM   Specimen: Nasal Mucosa; Nasal Swab  Result Value  Ref Range Status   MRSA by PCR Next Gen NOT DETECTED NOT DETECTED Final    Comment: (NOTE) The GeneXpert MRSA Assay (FDA approved for NASAL specimens only), is one component of a comprehensive MRSA colonization surveillance program. It is not intended to diagnose MRSA infection nor to guide or monitor treatment for MRSA infections. Test performance is not FDA approved in patients less than 62 years old. Performed at [facility], [address], [place], [place] [address]   Aerobic/Anaerobic Culture w Gram Stain (surgical/deep wound)     Status: Abnormal   Collection Time: 05/02/24  6:01 PM   Specimen: BILE  Result Value Ref Range Status   Specimen Description   Final    BILE Performed at [facility], [address], [place], [place] [address]    Special Requests   Final    NONE Performed at [facility], [address] [place], [place] [address]    Gram Stain NO WBC SEEN RARE GRAM POSITIVE RODS   Final   Culture (A)  Final    MULTIPLE ORGANISMS PRESENT, NONE PREDOMINANT AEROBICALLY RARE PREVOTELLA MELANINOGENICA BETA LACTAMASE POSITIVE Performed at [facility], [address] [place], [place] [address]    Report Status 05/05/2024 FINAL  Final     Labs: CBC: Recent Labs  Lab 05/03/24 0745 05/03/24 1727 05/03/24 1751 05/04/24 0255 05/05/24 0457 05/06/24 0436 05/07/24 0636  WBC 10.3  --   --  14.5* 12.0* 11.7* 8.5  NEUTROABS 8.4*  --   --   --   --   --   --   HGB 10.4*   < > 11.6* 9.8* 8.8* 8.7* 8.8*  HCT 35.1*   < > 34.0* 32.7* 29.7* 29.6* 29.5*  MCV 68.8*  --   --  68.6* 69.6* 68.8* 68.9*  PLT 565*  --   --  566* 350 359 448*   < > = values in this interval not displayed.    Basic Metabolic Panel: Recent Labs  Lab 05/03/24 0745 05/03/24 1727 05/03/24 1751 05/04/24 0255 05/05/24 0457 05/06/24 0436 05/07/24 0636  NA 133*   < > 134* 132* 132* 130* 133*  K 4.5   < > 3.9 3.7 3.3* 3.8 4.0  CL 100  --   --  99 99 98 101  CO2 24  --   --  26 25 26 26   GLUCOSE 122*  --   --  154* 102* 122* 91  BUN 8  --   --  17 15 20 17   CREATININE 0.59*  --   --  0.62 0.49* 0.46* 0.59*  CALCIUM  8.6*  --   --  7.7* 7.7* 7.8* 7.9*  MG 2.3  --   --  2.0 2.3 2.3 2.1  PHOS  --   --   --  2.3* 2.1* 2.6 2.6   < > = values in this interval not displayed.    Liver Function Tests: Recent Labs  Lab 05/03/24 0745 05/04/24 0255  05/05/24 0457 05/06/24 0436 05/07/24 0636  AST 441* 260* 109* 61* 43*  ALT 384* 298* 186* 141* 107*  ALKPHOS 967* 749* 554* 483* 423*  BILITOT 1.3* 0.9 0.7 0.8 1.0  PROT 5.6* 5.0* 4.8* 4.9* 4.9*  ALBUMIN 2.9* 2.8* 2.5* 2.5* 2.6*    CBG: Recent Labs  Lab 05/01/24 2249  GLUCAP 143*   Discussed in detail with patient's daughter at bedside, updated care and answered all questions.  Time coordinating discharge: 35 minutes  SIGNED:  Trenda Mar, MD,  FACP, [facility], [facility], [facility]   [facility]    To contact the attending provider between 7A-7P or the covering provider during after hours 7P-7A, please log into the web site [email or web address] and access using universal [place] password for that web site. If you do not have the password, please call the hospital operator.    [1]  Allergies Allergen Reactions   Lisinopril Cough   Nitroglycerin  Other (See Comments)    Blood pressure drop   Penicillins Rash

## 2024-05-07 NOTE — Consult Note (Addendum)
 " Consultation Note Date: 05/06/2024  Patient Name: Bryan Craig  DOB: 01/30/1951  MRN: [id number]  Age / Sex: 74 y.o., male   PCP: Bryan Craig Referring Physician: Gretta Leita SQUIBB, DO  Reason for Consultation: Establishing goals of care     Chief Complaint/History of Present Illness:  Patient is a 74 year old male with known biliary dilatation and elevated LFTs who presented for EUS and possible ERCP that was abandoned due to severe ulceration in gastric body and antrum.  Biopsy performed and he was admitted for observation and IR consult.  He then developed chest pain, dizziness, and was diagnosed with NSTEMI.  He was transferred to [facility] and had left heart cath with findings of occluded ramus high-grade LAD.  This was stented and he had another left heart cath following episode of V. tach that showed an in-stent thrombosis.  He was admitted to ICU.  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  He has been able to be weaned from inotrope support today.  Palliative asked to consult regarding goals of care and options for hospice care at time of discharge.  Chart reviewed including personal review of pertinent labs, imaging, and notes from PCCM and HF.  Discussed case with Dr. Gretta.  We had a joint meeting with daughter and wife and then discussed with Bryan Craig as well.  We discussed clinical course and continued progress with ability to wean from dobutamine  today.  Dr. Gretta feels he has reached the point of being able to transition out of the ICU.  We discussed expectations for his clinical course moving forward.  Reviewed criteria for inpatient hospice as family had prior expressed desire for him to transition to inpatient hospice at time of discharge from the hospital.  We discussed hospice care in detail including options of home with hospice versus skilled facility as he is not currently a candidate for inpatient hospice  unit.  Reviewed in detail home hospice benefit including care provided and management of symptoms and home setting.  Family believes that he would want to be at home and during conversation and involved patient, he expressed that this is a strong desire.  Following decision to transition home with hospice support I called and discussed in detail with hospice liaison as well.  We reviewed family desire for him to transition to [facility] when appropriate as they do not want for him to die in the home.  We also discussed his recent stent and need to continue with prasugrel  indefinitely at the time of discharge.   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  Wants to keep catheter  Past Medical History:  Diagnosis Date   Anemia    CAD (coronary artery disease)    Hepatitis    as a child   History of kidney stones    Hyperlipidemia    Hypertension    Myocardial infarction [facility])    Retinal detachment    Social History   Socioeconomic History   Marital status: Married    Spouse name: Bryan Craig   Number of children: 3   Years of education: Not on file   Highest education level: Not on file  Occupational History   Occupation: retired    Comment: civil service fast streamer  Tobacco Use   Smoking status: Never   Smokeless tobacco: Never  Building Services Engineer  status: Never Used  Substance and Sexual Activity   Alcohol use: Not Currently   Drug use: Never   Sexual activity: Yes  Other Topics Concern   Not on file  Social History Narrative   Married, 2 daughters with current wife and 1 daughter from previous marriage   Social Drivers of Health   Tobacco Use: Low Risk (05/01/2024)   Patient History    Smoking Tobacco Use: Never    Smokeless Tobacco Use: Never    Passive Exposure: Not on file  Financial Resource Strain: Low Risk (11/30/2023)   Overall Financial Resource Strain  (CARDIA)    Difficulty of Paying Living Expenses: Not hard at all  Food Insecurity: No Food Insecurity (05/01/2024)   Epic    Worried About Radiation Protection Practitioner of Food in the Last Year: Never true    Ran Out of Food in the Last Year: Never true  Transportation Needs: No Transportation Needs (05/01/2024)   Epic    Lack of Transportation (Medical): No    Lack of Transportation (Non-Medical): No  Physical Activity: Sufficiently Active (11/30/2023)   Exercise Vital Sign    Days of Exercise per Week: 7 days    Minutes of Exercise per Session: 50 min  Stress: No Stress Concern Present (11/30/2023)   [employer] of Occupational Health - Occupational Stress Questionnaire    Feeling of Stress: Not at all  Social Connections: Moderately Isolated (05/01/2024)   Social Connection and Isolation Panel    Frequency of Communication with Friends and Family: More than three times a week    Frequency of Social Gatherings with Friends and Family: More than three times a week    Attends Religious Services: Never    Database Administrator or Organizations: No    Attends Banker Meetings: Never    Marital Status: Married  Depression (PHQ2-9): Low Risk (11/30/2023)   Depression (PHQ2-9)    PHQ-2 Score: 0  Alcohol Screen: Low Risk (11/30/2023)   Alcohol Screen    Last Alcohol Screening Score (AUDIT): 2  Housing: Low Risk (05/01/2024)   Epic    Unable to Pay for Housing in the Last Year: No    Number of Times Moved in the Last Year: 0    Homeless in the Last Year: No  Utilities: Not At Risk (05/01/2024)   Epic    Threatened with loss of utilities: No  Health Literacy: Adequate Health Literacy (11/30/2023)   B1300 Health Literacy    Frequency of need for help with medical instructions: Never   Family History  Problem Relation Age of Onset   Hyperlipidemia Mother    Hypertension Mother    Heart disease Mother    Heart attack Father 1   Scheduled Meds:  amiodarone   200 mg Oral Daily   aspirin  EC  81  mg Oral Daily   atorvastatin   80 mg Oral Daily   Chlorhexidine  Gluconate Cloth  6 each Topical Daily   digoxin   0.125 mg Oral Daily   enoxaparin  (LOVENOX ) injection  40 mg Subcutaneous Q24H   feeding supplement  237 mL Oral TID BM   pantoprazole  (PROTONIX ) IV  40 mg Intravenous Daily   prasugrel   10 mg Oral Daily   sodium chloride  flush  3 mL Intravenous Q12H   sodium chloride  flush  5 mL Intracatheter Q8H   Continuous Infusions: PRN Meds:.albuterol , HYDROmorphone  (DILAUDID ) injection, iohexol , ondansetron  **OR** ondansetron  (ZOFRAN ) IV, mouth rinse, oxyCODONE , simethicone , sodium chloride  flush, sodium chloride  flush, traZODone  Allergies[1]  CBC:    Component Value Date/Time   WBC 11.7 (H) 05/06/2024 0436   HGB 8.7 (L) 05/06/2024 0436   HCT 29.6 (L) 05/06/2024 0436   PLT 359 05/06/2024 0436   MCV 68.8 (L) 05/06/2024 0436   NEUTROABS 8.4 (H) 05/03/2024 0745   LYMPHSABS 1.1 05/03/2024 0745   MONOABS 0.8 05/03/2024 0745   EOSABS 0.0 05/03/2024 0745   BASOSABS 0.0 05/03/2024 0745   Comprehensive Metabolic Panel:    Component Value Date/Time   NA 130 (L) 05/06/2024 0436   K 3.8 05/06/2024 0436   CL 98 05/06/2024 0436   CO2 26 05/06/2024 0436   BUN 20 05/06/2024 0436   CREATININE 0.46 (L) 05/06/2024 0436   CREATININE 0.71 03/09/2024 1201   GLUCOSE 122 (H) 05/06/2024 0436   CALCIUM  7.8 (L) 05/06/2024 0436   AST 61 (H) 05/06/2024 0436   ALT 141 (H) 05/06/2024 0436   ALKPHOS 483 (H) 05/06/2024 0436   BILITOT 0.8 05/06/2024 0436   BILITOT 0.5 09/04/2022 0836   PROT 4.9 (L) 05/06/2024 0436   PROT 6.8 09/04/2022 0836   ALBUMIN 2.5 (L) 05/06/2024 0436   ALBUMIN 4.5 09/04/2022 0836    Physical Exam: Vital Signs: BP 104/64 (BP Location: Left Arm)   Pulse 82   Temp 98.1 F (36.7 C) (Oral)   Resp 16   Ht 5' 7 (1.702 m)   Wt (P) 67.6 kg   SpO2 98%   BMI (P) 23.34 kg/m  SpO2: SpO2: 98 % O2 Device: O2 Device: Room Air O2 Flow Rate: O2 Flow Rate (L/min): 4  L/min Intake/output summary:  Intake/Output Summary (Last 24 hours) at 05/07/2024 0006 Last data filed at 05/06/2024 2128 Gross per 24 hour  Intake 549.48 ml  Output 1550 ml  Net -1000.52 ml   LBM: Last BM Date : 05/06/24 Baseline Weight: Weight: 66.7 kg Most recent weight: Weight: (P) 67.6 kg  General: NAD, alert Eyes: conjunctiva clear, anicteric sclera HENT: normocephalic, atraumatic, moist mucous membranes Cardiovascular: RRR, no edema in LE b/l Respiratory: no increased work of breathing noted, not in respiratory distress Abdomen: not distended Skin: no rashes or lesions on visible skin Neuro: A&O, following commands easily Psych: appropriately answers all questions          Palliative Performance Scale: 40              Additional Data Reviewed: Recent Labs    05/05/24 0457 05/06/24 0436  WBC 12.0* 11.7*  HGB 8.8* 8.7*  PLT 350 359  NA 132* 130*  BUN 15 20  CREATININE 0.49* 0.46*    Imaging: ECHOCARDIOGRAM COMPLETE    ECHOCARDIOGRAM REPORT       Patient Name:   Bryan Craig Date of Exam: 05/04/2024 Medical Rec #:  [id number]     Height:       67.0 in Accession #:    [id number]    Weight:       150.8 lb Date of Birth:  Jul 18, 1950     BSA:          1.794 m Patient Age:    73 years      BP:           118/86 mmHg Patient Gender: M             HR:           80 bpm. Exam Location:  Inpatient  Procedure: 2D Echo, Cardiac Doppler, Color Doppler and Intracardiac  Opacification Agent (Both Spectral and Color Flow Doppler were            utilized during procedure).  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 CHF, CAD and Acute MI, Signs/Symptoms:Chest                 Pain; Risk Factors:Hypertension and Dyslipidemia.   Sonographer:    Ellouise Mose RDCS Referring  Phys: [id number] GORDY BERGAMO    Sonographer Comments: Technically difficult study due to poor echo windows, suboptimal subcostal window and suboptimal parasternal window. Patient in trendelenburg position. Balloon pump present. IMPRESSIONS   1. Left ventricular ejection fraction, by estimation, is 20 to 25%. Left ventricular ejection fraction by 2D MOD biplane is 27.6 %. The left ventricle has severely decreased function. The left ventricle demonstrates regional wall motion abnormalities  (see scoring diagram/findings for description). There is mild asymmetric left ventricular hypertrophy of the basal-septal segment. Left ventricular diastolic parameters are consistent with Grade I diastolic dysfunction (impaired relaxation).  2. Right ventricular systolic function is normal. The right ventricular size is normal.  3. The mitral valve is normal in structure. No evidence of mitral valve regurgitation. No evidence of mitral stenosis.  4. The aortic valve is tricuspid. There is moderate calcification of the aortic valve. Aortic valve regurgitation is not visualized. Aortic valve sclerosis is present, with no evidence of aortic valve stenosis.  5. IABP seen in distal aortic arch.  Comparison(s): Changes from prior study are noted. The left ventricular function is worsened. LVEF has worsened compared to prior study, with wall motion abnormalities as noted.  Conclusion(s)/Recommendation(s): No left ventricular mural or apical thrombus/thrombi.  FINDINGS  Left Ventricle: Left ventricular ejection fraction, by estimation, is 20 to 25%. Left ventricular ejection fraction by 2D MOD biplane is 27.6 %. The left ventricle has severely decreased function. The left ventricle demonstrates regional wall motion  abnormalities. Definity  contrast agent was given IV to delineate the left ventricular endocardial borders. The left ventricular internal cavity size was normal in size. There is mild asymmetric left ventricular  hypertrophy of the basal-septal segment.  Left ventricular diastolic parameters are consistent with Grade I diastolic dysfunction (impaired relaxation).    LV Wall Scoring: The mid and distal anterior wall, mid and distal lateral wall, mid anterolateral segment, apical septal segment, and apex are akinetic. The anterior septum, basal inferolateral segment, basal anterolateral segment, mid inferoseptal segment, basal anterior segment, and basal inferoseptal segment are hypokinetic. The entire inferior wall is normal.  Right Ventricle: The right ventricular size is normal. No increase in right ventricular wall thickness. Right ventricular systolic function is normal.  Left Atrium: Left atrial size was normal in size.  Right Atrium: Right atrial size was normal in size.  Pericardium: There is no evidence of pericardial effusion.  Mitral Valve: The mitral valve is normal in structure. No evidence of mitral valve regurgitation. No evidence of mitral valve stenosis.  Tricuspid Valve: The tricuspid valve is normal in structure. Tricuspid valve regurgitation is trivial. No evidence of tricuspid stenosis.  Aortic Valve: The aortic valve is tricuspid. There is moderate calcification of the aortic valve. Aortic valve regurgitation is not visualized.  Aortic valve sclerosis is present, with no evidence of aortic valve stenosis.  Pulmonic Valve: The pulmonic valve was not well visualized. Pulmonic valve regurgitation is not visualized. No evidence of pulmonic stenosis.  Aorta: IABP seen in distal aortic arch. The ascending aorta was not well visualized and the aortic root is normal in size and structure.  Venous: The inferior vena cava was not well visualized.  IAS/Shunts: No atrial level shunt detected by color flow Doppler.  Additional Comments: There is a small pleural effusion in the left lateral region.    LEFT VENTRICLE PLAX 2D                        Biplane EF (MOD) LVIDd:         4.80  cm         LV Biplane EF:   Left LVIDs:         4.10 cm                          ventricular LV PW:         1.10 cm                          ejection LV IVS:        1.30 cm                          fraction by LVOT diam:     2.30 cm                          2D MOD LV SV:         72                               biplane is LV SV Index:   40                               27.6 %. LVOT Area:     4.15 cm                                Diastology                                LV e' medial:    3.70 cm/s LV Volumes (MOD)               LV E/e' medial:  17.6 LV vol d, MOD    123.0 ml      LV e' lateral:   11.20 cm/s A2C:                           LV E/e' lateral: 5.8 LV vol d, MOD    109.0 ml A4C: LV vol s, MOD    79.6 ml A2C: LV vol s, MOD    88.6 ml A4C: LV SV MOD A2C:   43.4 ml LV SV MOD A4C:   109.0 ml LV SV MOD BP:    32.2 ml  RIGHT VENTRICLE  IVC RV S prime:     15.90 cm/s  IVC diam: 1.80 cm TAPSE (M-mode): 1.5 cm  LEFT ATRIUM           Index       RIGHT ATRIUM          Index LA diam:      3.30 cm 1.84 cm/m  RA Area:     9.60 cm LA Vol (A2C): 11.4 ml 6.36 ml/m  RA Volume:   20.10 ml 11.21 ml/m LA Vol (A4C): 15.9 ml 8.87 ml/m  AORTIC VALVE LVOT Vmax:   110.00 cm/s LVOT Vmean:  73.900 cm/s LVOT VTI:    0.173 m   AORTA Ao Root diam: 2.75 cm  MITRAL VALVE MV Area (PHT): 3.60 cm    SHUNTS MV Decel Time: 211 msec    Systemic VTI:  0.17 m MV E velocity: 65.00 cm/s  Systemic Diam: 2.30 cm MV A velocity: 57.60 cm/s MV E/A ratio:  1.13  Shelda Bruckner MD Electronically signed by Shelda Bruckner MD Signature Date/Time: 05/04/2024/1:45:19 PM      Final   CARDIAC CATHETERIZATION Images from the original result were not included. Cardiac Catheterization 05/03/2024: Hemodynamic data: LVEDP 12 mmHg.  No pressure gradient across aortic valve.  Angiographic data: LM: Large-caliber vessel, smooth and normal. LAD: Is a very large caliber vessel giving  origin to a large D1 which is  secondary branches with ostial 20 to 30% stenosis.  Proximal LAD prior to  D1 bifurcation has a ulcerated and thrombotic 90% stenosis, and has a  tandem 60 to 70% stenosis. LCx: Large-caliber vessel, ostial 50% stenosis, mid 30% stenosis.  Gives  origin to large OM1 and a moderate-sized OM 2. RI: It is occluded in the proximal segment at previously stented segment.   Appears to be old.  Intervention data: After collegial discussions, I suspect to proceed with  aspiration thrombectomy and low-pressure balloon inflation at the  ulcerated and thrombotic proximal and mid LAD.  Unfortunately following  the procedure, patient had significant of organized white thrombus  aspirated and had a very unstable lesion that occluded hence had to be  stented with a 3.0 x 38 mm Synergy XD DES at 20 atmospheric pressure.   Slight stepup and stepdown were evident in the stented segment with  excellent TIMI-3 flow without evidence of edge dissection.  Impression and recommendations: Patient will need DAPT for 1 year however I tried to not to stent the  lesion in view of his malignancy and probable further need for GI workup.   Unfortunately without intervention to the LAD, it would have been lethal  for the patient as he would have probably occluded the LAD at some point  during the hospitalization in view of thrombotic state.  Patient  transferred to the floor in a stable condition. CARDIAC CATHETERIZATION Addendum: Cardiac Catheterization 05/03/2024:  Hemodynamic data:  RA 12/11, mean 11 mmHg.  RV 30/6, EDP 10 mmHg  PA 31/15, mean 21 mmHg  PW 22/29, mean 23 mmHg.   QP/QS 1.0.  CO 4.81, CI 2.69 by Fick.  Papi 1.5.   Angiographic data:  LAD is occluded in the ostium.   Intervention data: Successful balloon angioplasty with 3.0 x 20 mm Emerge  balloon into the ostial and also proximal LAD revealing thrombotic  occlusion of the stent itself, heavy thrombus was evident  which cleared up  with intracoronary Aggrastat .  0% residual stenosis with improvement in  TIMI flow from 0-3.  There was  also a distal Cx occlusion at the beginning of the procedure  again suggesting hypercoagulable state, this resolved with intracoronary  Aggrastat  infusion.   Impression and recommendations:  Intra-aortic balloon pump placed, patient's hemodynamics improved,  transferred to intensive care unit for further management.  Patient will  need to be on DAPT without interruption as he is hypercoagulable probably  due to his underlying malignancy.  Extremely difficult situation. Narrative: Images from the original result were not included. Cardiac Catheterization 05/03/2024: Hemodynamic data: RA 12/11, mean 11 mmHg. RV 30/6, EDP 10 mmHg PA 31/15, mean 21 mmHg PW 22/29, mean 23 mmHg.  QP/QS 1.0.  CO 4.81, CI 2.69 by Fick.  Papi 1.5.  Angiographic data: LAD is occluded in the ostium.  Intervention data: Successful balloon angioplasty with 3.0 x 20 mm Emerge  balloon into the ostial and also proximal LAD revealing thrombotic  occlusion of the stent itself, heavy thrombus was evident which cleared up  with intracoronary Aggrastat .  0% residual stenosis with improvement in  TIMI flow from 0-3.  Impression and recommendations: Intra-aortic balloon pump placed, patient's hemodynamics improved,  transferred to intensive care unit for further management.  Patient will  need to be on DAPT without interruption as he is hypercoagulable probably  due to his underlying malignancy.  Extremely difficult situation.    I personally reviewed recent imaging.   Palliative Care Assessment and Plan Summary of Established Goals of Care and Medical Treatment Preferences    # Complex medical decision making/goals of care  - Goal to transition home with hospice support.  - Discussed with hospice liaison.  Family does not want him to die in the home.  He will also need to continue prasugrel  at  time of discharge.  This may require formulary exception from hospice to continue but is medically necessary.  -  Code Status: Limited: Do not attempt resuscitation (DNR) -DNR-LIMITED -Do Not Intubate/DNI   Prognosis: < 3 months  # Psycho-social/Spiritual Support:  - Support System: family including wife and daughter  # Discharge Planning:  Home with Hospice  Thank you for allowing the palliative care team to participate in the care Bryan Craig.  Amaryllis Meissner, MD Palliative Care Provider PMT # [phone]  If patient remains symptomatic despite maximum doses, please call PMT at [phone] between 0700 and 1900. Outside of these hours, please call attending, as PMT does not have night coverage.   I personally spent a total of 75 minutes in the care of the patient today including preparing to see the patient, getting/reviewing separately obtained history, performing a medically appropriate exam/evaluation, counseling and educating, placing orders, referring and communicating with other health care professionals, documenting clinical information in the EHR, and coordinating care.     [1]  Allergies Allergen Reactions   Lisinopril Cough   Nitroglycerin  Other (See Comments)    Blood pressure drop   Penicillins Rash   "

## 2024-05-07 NOTE — Progress Notes (Signed)
 Patient ID: Bryan Craig, male   DOB: 1950-06-08, 74 y.o.   MRN: [id number]  Plan for hospice care, hopefully will be able to go home with hospice and eventually transition to inpatient hospice when he cannot take po meds.  BP stable off dobutamine .  Would continue his current cardiac po regimen of ASA, Effient , amiodarone , atorvastatin , digoxin  for now.   Cardiology will sign off, call with questions.   Ezra Shuck 05/07/2024

## 2024-05-07 NOTE — Discharge Instructions (Signed)

## 2024-05-08 LAB — SURGICAL PATHOLOGY

## 2024-05-15 ENCOUNTER — Encounter (HOSPITAL_COMMUNITY): Payer: Self-pay

## 2024-05-18 ENCOUNTER — Telehealth: Payer: Self-pay | Admitting: Medical

## 2024-05-18 NOTE — Telephone Encounter (Signed)
 Copied from CRM #[id number]. Topic: General - Call Back - No Documentation >> May 18, 2024 12:29 PM Rea ORN wrote: Reason for CRM: Pt spouse Sinclair requesting a call back from PCP. When asked what this is about, she stated when he see's his chart he will know.   Please call back to advise,  [phone]

## 2024-05-18 NOTE — Telephone Encounter (Signed)
 Family called this afternoon having question on Bryan Craig. Pt was discharged on 05-07-2024. On review of that note instructed to follow up with me. Have not seen him since DC though had reviewed hospital notes when sent to me.  At end of DC summary MD stated  Discussed in detail with patient's Craig at bedside, updated care and answered all questions.   I had long conversation estimated 25-30 minutes or more with pt Craig today regarding Bryan Craig  cancer diagnosis. Confirmed with Bryan Craig that I can talk with her since she was not on DPR. I was given permission by Mr. [name].   Craig started conversation wanting to know when I had prescribed zofran . I told her date was 03-30-2024. She stated she wanted to know when I had prescribed zofran  as she expressed it appears prescribed Zofran  knowing that he had cancer. I told her at that time it was not known that he had cancer and that I commonly prescribe that for nausea.   On review of chart his lipase had been elevated. Do usually prescribe zofran  for pancreatis as is accompanied by nausea and vomiting.  Did not see his lipase was elevated  initially during conversation with Craig. However,later after extensive chart review it had been elevated during work up.(I was at home/not at work but did have my computer available).   Pt had imaging studies showing abnormality of gallbladder and pancrease.  Prior lab work showed anemia, blood in stool ,elevated liver enzyme and the elevated lipase prompting referral to both gastroenterologist and surgeon.   03-15-24 and 04-04-2024  notes in epic show calls from  office contacting pt after referral were placed. See those notes.  Note also  my  chart I believe has been active throughout process.   Pt was seen by gi office on 04-14-2024. Lab work was done by GI office and results communicated then appears admission for procedure was moved up.      04/18/24 12:34 PM Result Note The pt has been advised  via My Chart in English and Spanish. He does view messages  AFP tumor marker; Cancer antigen 19-9; Lipase; Comprehensive metabolic panel with GFR; CBC with Differential/Platelet     04/18/24 12:34 PM Hemos podido adelantar su cita al 05/01/24. Consulte las nuevas instrucciones a continuacin. Asegrese de verificar el cambio de ubicacin. [facility] ya no es la ubicacin.  On 04-18-2024  GI office provider notified Mr. Bryan Craig by mychart(Note I am not sure if patient viewed) Your cancer marker is elevated. We are working on expediting your workup. You should get a phone call from us  soon.    Pt was hospitalized then procedures were done followed by pathology results. Craig wanted to know when he received cancer diagnosis I explained to her t his biopsy result came back on  05-01-2024 though on further review may have been results on 05-02-24  showed poorly differentiated adenocarcinoma.    On 05-02-2024 see communication between the 2 GI MDs that I had placed referral to.  [name], [name], MD to Me  [name], [name]    05/02/24 12:08 PM Result Note Inpatient GI team and medicine team are aware of this. Referral/consultation with oncology will be placed by the inpatient medicine service. Percutaneous biliary drainage will need to be pursued as there is no endoscopic therapie (this is being pursued as an inpatient) s available at this time for the patient's progressive biliary obstruction. At this point no further GI workup.  05-03-2024 hospitai note mentioned   Gastric biopsy came back positive for adenocarcinoma.  Oncology consulted.       Subjective: Seen and examined earlier this morning.  No major events overnight or this morning.  I met family including wife and 2 daughters in patient's room earlier this morning. Patient was transferred to [facility] for cardiac catheterization early in the morning and I was unable to reevaluate patient physical.      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 - Oncology on board.   Biliary obstruction/elevated LFT-likely due to the above. -Unsuccessful EUS and ERCP by GI due to the above. -S/p percutaneous transhepatic biliary tube placement and antegrade cholangiogram on 1/6. -LFT trended up.  Continue monitoring. -Management per IR   On 05-07-2023 dc summary stated(on review did see Palliative care)Also do think IR was consulted.  1/10: PCCM and palliative care MDs had goals of discussion with patient and family (spouse and Craig) and they opted for DC home hospice (presently not candidate for inpatient hospice) with plans for eventual transfer to inpatient hospice when he no longer can have his symptoms controlled at home.     On 05-07-2024 palliative consult  HPI: Bryan Craig is a 74 year old male with a past medical history of coronary artery disease, hyperlipidemia, hypertension, anemia, and myocardial infarction.  Admitted in the setting of cardiogenic shock required pressor therapy in the ICU.  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  Conversations were held yesterday with both the critical care team and the palliative care team.  Patient's family have elected for him to discharge home with hospice care.  Palliative care remains involved to help with goals of care conversations and discharge planning.  SUMMARY OF RECOMMENDATIONS   DNAR/DNI   Continue current measures   Appreciate transitions of care team working with [facility] [place] to secure DME   Plan for transition home as early as today if all care coordination can be completed   The palliative care team will follow as needed   Pt Craig was upset on the phone and attempted to express sympathies as well as give her understanding complicated time line of event(visit, labs, referral and when biopsy results resulted/when  informed. She kept asking when he was told he had cancer. She expressed frustration stating GI MD never told him or family. She also expressed that I had dropped the ball(I explained to her at time of diagnosis he was in hospital). I expressed hospitalist may have been the first to inform him .She expressed negligence occurred and accountability.   Again tried to explain timeline of  recent visits  prior to admission, various attempts to expedite referral process(attempted communication by phone and my chart and summarized diagnosis of cancer was determined in hospital. Looks like biopsy done on 05-01-2024 and result came back on 05-02-2024.  Prepared above as template to try summarize events for Craig understanding. Will also send copy of this to Supervising MD and office manger.  I also explained that I was not in the office and that it probably would be better to have in office visit such as this coming Tuesday. I could block off 40 minutes to go over details in chart and help Craig get her questions answered. I explained since she is not on dpr that her mom may need to be present. Craig stated her mother can't come in explaining would be too much in light of dealing with her  husband diagnosis. She also state Mr. Leverich not well enough to come in. Pt Craig stated she may come in later but not sure if will come.   Did talk with office manager about potential visit with Craig to discuss with Craig but if patient not in office and he is not present then would have to pay charge that is not covered by insurance.  Print production planner stated they can advise Craig on charge if she comes in to discuss.

## 2024-05-20 ENCOUNTER — Other Ambulatory Visit: Payer: Self-pay | Admitting: Medical

## 2024-05-21 ENCOUNTER — Telehealth: Payer: Self-pay | Admitting: Medical

## 2024-05-21 ENCOUNTER — Encounter: Payer: Self-pay | Admitting: Medical

## 2024-05-21 NOTE — Telephone Encounter (Signed)
 Notes made after extensive review of recent visit, labs, referral and hospitalization. Refer to this in event agree to video visit hospital follow up.  I prescribed zofran  when not diagnosed with cancer(zofran  was prescribed for nausea a I had concerns for pancreatitis with associated nausea). See prior conversation. Daughter stated on last phone call I prescribe knowing he had cancer which was not the case.  At the time I saw you in November 2025 it had been 13 months since I saw pt. In the summer of 2024 your weight was 175 lb. When I saw pt on March 09, 2024 your weight was 163 lbs. Later on April 03, 2024 was 161 lb. During this time interval of summer 2024 and December that weight loss was  net 14 lbs over that time period. This contrast the discussion with hospitalist on 05-04-2023 when it was noted about 30 lb weight loss in 6 months.The 30 lb weight loss over 6 months  was reported by your daughter day after your pathology report came back on discussion with oncologist. I mentioned this as I do think there is significant difference.  On November visit you described hypotension  possibly related to dehydration On review of that note no report of gastrointestinal symptoms such as pain, nausea or vomiting. As your lab work up was done and subsequent labs came back imaging work up was expanded by US  then MRCP order on 03-21-2024. These studies were done in expedited manner.  Since results of MRCP imaging study referrals were placed immediately to both gastroenterologist and surgeon. Calls and my chart message sent to your dad were made. We contacted specialist offices as well. In total I placed 5 referrals  made during the process. As the first general surgeon referral did not accept insurance and one of GI MD initially indicated he could not do ERCP until late/toward end of January. So at that point put in 3rd GI referral to [facility] on Sunday 3:16 in afternoon. This was the 3rd referral as had  already place referral to [facility] and a [facility] MD in attempt to expedite the referral.   You eventually got in with surgeon who helped facilitate referral to GI to [facility] which was already placed. I had referred you to surgeon as sometimes gallbladder stone can dilate duct and play a role in lipase elevation. Also radiologist at one point mentioned possible polyp in gallbladder.  Addendum today [id number]- saw palliative care note today from recent visit as they sent me note. Then reviewed referral again. On review I had placed 3 referrals. Rather than 5. Initial 2 one to general surgeon and one to GI MD. Final /3rd was to [facility]. I thought had referred to Dr. Wilhelmenia as reviewed his name on my referral list section. I thought Dr Polly was 5th referral as not familiar with his name. Usually when refer patients to  [place] get pt in with other surgeons. Did appreciate Dr. Polly input and coordination of care with GI office.

## 2024-05-23 MED ORDER — METHYLPREDNISOLONE 4 MG PO TABS: ORAL_TABLET | ORAL | 0 refills | Status: DC

## 2024-05-23 NOTE — Addendum Note (Signed)
 Addended by: DORINA DALLAS DORINA PA-C M on: 05/23/2024 04:29 PM   Modules accepted: Orders

## 2024-05-24 ENCOUNTER — Encounter: Payer: Self-pay | Admitting: Medical

## 2024-06-28 ENCOUNTER — Other Ambulatory Visit (HOSPITAL_COMMUNITY)

## 2024-11-30 ENCOUNTER — Ambulatory Visit
